# Patient Record
Sex: Male | Born: 1950
Health system: Southern US, Community
[De-identification: ages and names within clinical notes are randomized; demographics above are authoritative.]

## PROBLEM LIST (undated history)

## (undated) DIAGNOSIS — C61 Malignant neoplasm of prostate: Secondary | ICD-10-CM

## (undated) DIAGNOSIS — H409 Unspecified glaucoma: Secondary | ICD-10-CM

## (undated) DIAGNOSIS — J449 Chronic obstructive pulmonary disease, unspecified: Secondary | ICD-10-CM

## (undated) DIAGNOSIS — Z8669 Personal history of other diseases of the nervous system and sense organs: Secondary | ICD-10-CM

## (undated) DIAGNOSIS — H269 Unspecified cataract: Secondary | ICD-10-CM

## (undated) DIAGNOSIS — I2510 Atherosclerotic heart disease of native coronary artery without angina pectoris: Secondary | ICD-10-CM

## (undated) DIAGNOSIS — E785 Hyperlipidemia, unspecified: Secondary | ICD-10-CM

## (undated) DIAGNOSIS — C4491 Basal cell carcinoma of skin, unspecified: Secondary | ICD-10-CM

## (undated) HISTORY — DX: Chronic obstructive pulmonary disease, unspecified: J44.9

## (undated) HISTORY — PX: JOINT REPLACEMENT: SHX530

## (undated) HISTORY — DX: Personal history of other diseases of the nervous system and sense organs: Z86.69

## (undated) HISTORY — DX: Unspecified cataract: H26.9

## (undated) HISTORY — PX: HERNIA REPAIR: SHX51

## (undated) HISTORY — DX: Malignant neoplasm of prostate: C61

## (undated) HISTORY — DX: Basal cell carcinoma of skin, unspecified: C44.91

## (undated) HISTORY — DX: Hyperlipidemia, unspecified: E78.5

## (undated) HISTORY — DX: Unspecified glaucoma: H40.9

---

## 1998-02-18 HISTORY — PX: SIGMOIDOSCOPY: SUR1295

## 2002-02-18 HISTORY — PX: PROSTATECTOMY: SHX69

## 2007-02-19 HISTORY — PX: TOTAL KNEE ARTHROPLASTY: SHX125

## 2011-02-19 HISTORY — PX: COLONOSCOPY: SHX174

## 2013-02-18 HISTORY — PX: RETINAL DETACHMENT SURGERY: SHX105

## 2015-02-19 HISTORY — PX: RETINAL LASER PROCEDURE: SHX2339

## 2016-02-29 DIAGNOSIS — H9193 Unspecified hearing loss, bilateral: Secondary | ICD-10-CM | POA: Diagnosis not present

## 2016-02-29 DIAGNOSIS — Z8669 Personal history of other diseases of the nervous system and sense organs: Secondary | ICD-10-CM | POA: Diagnosis not present

## 2016-02-29 DIAGNOSIS — H9313 Tinnitus, bilateral: Secondary | ICD-10-CM | POA: Diagnosis not present

## 2016-02-29 DIAGNOSIS — Z8546 Personal history of malignant neoplasm of prostate: Secondary | ICD-10-CM | POA: Insufficient documentation

## 2016-02-29 DIAGNOSIS — H5461 Unqualified visual loss, right eye, normal vision left eye: Secondary | ICD-10-CM | POA: Diagnosis not present

## 2016-02-29 DIAGNOSIS — Z808 Family history of malignant neoplasm of other organs or systems: Secondary | ICD-10-CM | POA: Diagnosis not present

## 2016-02-29 DIAGNOSIS — Z136 Encounter for screening for cardiovascular disorders: Secondary | ICD-10-CM | POA: Diagnosis not present

## 2016-03-01 LAB — PSA

## 2016-03-01 LAB — BASIC METABOLIC PANEL
Creatinine: 0.9 (ref 0.6–1.3)
Glucose: 94

## 2016-03-01 LAB — CBC AND DIFFERENTIAL
HCT: 43 (ref 41–53)
HEMOGLOBIN: 14.4 (ref 13.5–17.5)
WBC: 6

## 2016-03-01 LAB — LIPID PANEL
CHOLESTEROL: 238 — AB (ref 0–200)
HDL: 56 (ref 35–70)
LDL CALC: 170
Triglycerides: 59 (ref 40–160)

## 2016-03-05 DIAGNOSIS — Z23 Encounter for immunization: Secondary | ICD-10-CM | POA: Diagnosis not present

## 2016-04-01 DIAGNOSIS — B351 Tinea unguium: Secondary | ICD-10-CM | POA: Diagnosis not present

## 2016-04-01 DIAGNOSIS — D492 Neoplasm of unspecified behavior of bone, soft tissue, and skin: Secondary | ICD-10-CM | POA: Diagnosis not present

## 2016-04-01 DIAGNOSIS — D229 Melanocytic nevi, unspecified: Secondary | ICD-10-CM | POA: Diagnosis not present

## 2016-04-01 DIAGNOSIS — L821 Other seborrheic keratosis: Secondary | ICD-10-CM | POA: Diagnosis not present

## 2016-04-01 DIAGNOSIS — L570 Actinic keratosis: Secondary | ICD-10-CM | POA: Diagnosis not present

## 2016-05-20 DIAGNOSIS — H35412 Lattice degeneration of retina, left eye: Secondary | ICD-10-CM | POA: Diagnosis not present

## 2016-05-20 DIAGNOSIS — H4720 Unspecified optic atrophy: Secondary | ICD-10-CM | POA: Diagnosis not present

## 2016-05-20 DIAGNOSIS — H33051 Total retinal detachment, right eye: Secondary | ICD-10-CM | POA: Diagnosis not present

## 2016-05-29 DIAGNOSIS — H35412 Lattice degeneration of retina, left eye: Secondary | ICD-10-CM | POA: Diagnosis not present

## 2016-12-30 DIAGNOSIS — M5136 Other intervertebral disc degeneration, lumbar region: Secondary | ICD-10-CM | POA: Diagnosis not present

## 2017-02-18 HISTORY — PX: BASAL CELL CARCINOMA EXCISION: SHX1214

## 2017-03-18 ENCOUNTER — Ambulatory Visit (INDEPENDENT_AMBULATORY_CARE_PROVIDER_SITE_OTHER): Payer: Medicare Other

## 2017-03-18 ENCOUNTER — Encounter: Payer: Self-pay | Admitting: Podiatry

## 2017-03-18 ENCOUNTER — Ambulatory Visit (INDEPENDENT_AMBULATORY_CARE_PROVIDER_SITE_OTHER): Payer: Medicare Other | Admitting: Podiatry

## 2017-03-18 VITALS — BP 144/73 | HR 79 | Resp 16

## 2017-03-18 DIAGNOSIS — A499 Bacterial infection, unspecified: Secondary | ICD-10-CM | POA: Diagnosis not present

## 2017-03-18 DIAGNOSIS — M778 Other enthesopathies, not elsewhere classified: Secondary | ICD-10-CM

## 2017-03-18 DIAGNOSIS — M7750 Other enthesopathy of unspecified foot: Secondary | ICD-10-CM | POA: Diagnosis not present

## 2017-03-18 DIAGNOSIS — M779 Enthesopathy, unspecified: Principal | ICD-10-CM

## 2017-03-18 DIAGNOSIS — M79673 Pain in unspecified foot: Secondary | ICD-10-CM

## 2017-03-18 DIAGNOSIS — B351 Tinea unguium: Secondary | ICD-10-CM | POA: Diagnosis not present

## 2017-03-18 DIAGNOSIS — L603 Nail dystrophy: Secondary | ICD-10-CM | POA: Diagnosis not present

## 2017-03-18 DIAGNOSIS — L814 Other melanin hyperpigmentation: Secondary | ICD-10-CM | POA: Diagnosis not present

## 2017-03-18 NOTE — Progress Notes (Signed)
Dg  

## 2017-03-18 NOTE — Progress Notes (Signed)
Subjective:  Patient ID: Bryan Cowan, male    DOB: 05-Sep-1950,  MRN: [id number] HPI Chief Complaint  Patient presents with  . Foot Pain    Patient has multiple concerns - 1) involuntart movements/twitches in toes-does know that he has a nerve impingement in back, 2) Toenails thickened and discolored-has taken oral antifungal in the past and would like other options for treatment, 3) Calluses plantar forefoot bilateral, 4) recommendations on foot care-interested in orthotics and shoe gear    67 y.o. male presents with the above complaint.     No past medical history on file.  Current Outpatient Medications:  .  Coenzyme Q10 100 MG capsule, Take by mouth., Disp: , Rfl:  .  MILK THISTLE PO, Take by mouth., Disp: , Rfl:  .  Multiple Vitamin (MULTIVITAMIN) capsule, Take by mouth., Disp: , Rfl:  .  Omega-3 Fatty Acids (FISH OIL) 1000 MG CAPS, Take by mouth., Disp: , Rfl:   No Known Allergies Review of Systems  Musculoskeletal: Positive for back pain.  All other systems reviewed and are negative.  Objective:   Vitals:   03/18/17 1601  BP: (!) 144/73  Pulse: 79  Resp: 16    General: Well developed, nourished, in no acute distress, alert and oriented x3   Dermatological: Skin is warm, dry and supple bilateral. Nails x 10 are well maintained; remaining integument appears unremarkable at this time. There are no open sores, no preulcerative lesions, no rash or signs of infection present.  Diffuse tyloma to the plantar aspect of the forefoot bilateral due to cavus deformity.  Also demonstrates thick yellow dystrophic clinically mycotic nails 1 through 5 of the right foot 3 through 5 of the left foot  Vascular: Dorsalis Pedis artery and Posterior Tibial artery pedal pulses are 2/4 bilateral with immedate capillary fill time. Pedal hair growth present. No varicosities and no lower extremity edema present bilateral.   Neruologic: Grossly intact via light touch bilateral. Vibratory intact  via tuning fork bilateral. Protective threshold with Semmes Wienstein monofilament intact to all pedal sites bilateral. Patellar and Achilles deep tendon reflexes 2+ bilateral. No Babinski or clonus noted bilateral.  Very slightly diminished sensory perception to the plantar forefoot most likely due to the thickening callus in the chronic pressures.  Musculoskeletal: No gross boney pedal deformities bilateral. No pain, crepitus, or limitation noted with foot and ankle range of motion bilateral. Muscular strength 5/5 in all groups tested bilateral.  Cavus foot deformity with hammertoe deformities resulting in reactive hyperkeratosis to the plantar aspect of the forefoot.  He does appear to have some arthritic changes to the lesser toes.  Particularly those on the right foot.  Gait: Unassisted, Nonantalgic.    Radiographs:  Radiographs taken today do not demonstrate any acute osseous abnormalities.  However cavus foot deformity is noted with hammertoe deformities.  Some appear to be arthritic particularly second digit right greater than that of the left  Assessment & Plan:   Assessment: Cavus foot deformity with metatarsalgia and some neuropathic symptoms probably most associated with metatarsal pressure.  Hammertoe deformities.  Nail dystrophy cannot rule out onychomycosis.  Plan: Discussed etiology pathology conservative versus surgical therapies.  I feel that a lot of his foot discomfort could be taken care of with a good set of orthotics.  He was casted today with Liliane Channel for orthotics.  Samples of his skin and nail was also taken for pathologic evaluation.  He does not want to take oral medication.  I offered him gabapentin  as well for his neuropathic nocturnal symptoms but he declined.     Carrine Kroboth T. [place], [place]

## 2017-03-19 ENCOUNTER — Encounter: Payer: Self-pay | Admitting: Podiatry

## 2017-04-03 ENCOUNTER — Ambulatory Visit: Payer: Medicare Other | Admitting: Podiatry

## 2017-04-14 ENCOUNTER — Ambulatory Visit (INDEPENDENT_AMBULATORY_CARE_PROVIDER_SITE_OTHER): Payer: Medicare Other | Admitting: Orthotics

## 2017-04-14 DIAGNOSIS — M779 Enthesopathy, unspecified: Principal | ICD-10-CM

## 2017-04-14 DIAGNOSIS — M778 Other enthesopathies, not elsewhere classified: Secondary | ICD-10-CM

## 2017-04-14 NOTE — Progress Notes (Signed)
Patient came in today to pick up custom made foot orthotics.  I wasn't happy with the way the arch wasn't supported, even though we asked for "hug the arch".[name]

## 2017-05-05 ENCOUNTER — Ambulatory Visit (INDEPENDENT_AMBULATORY_CARE_PROVIDER_SITE_OTHER): Payer: Medicare Other | Admitting: Podiatry

## 2017-05-05 ENCOUNTER — Encounter: Payer: Self-pay | Admitting: Podiatry

## 2017-05-05 DIAGNOSIS — Z79899 Other long term (current) drug therapy: Secondary | ICD-10-CM

## 2017-05-05 DIAGNOSIS — L603 Nail dystrophy: Secondary | ICD-10-CM

## 2017-05-05 MED ORDER — TERBINAFINE HCL 250 MG PO TABS: 250.0000 mg | ORAL_TABLET | Freq: Every day | ORAL | 0 refills | Status: DC

## 2017-05-05 NOTE — Progress Notes (Signed)
He presents today for follow-up of his supinated foot type left and the use of the orthotics along with nail biopsy.  Objective: Vital signs are stable he is alert and oriented x3 he is going to follow-up with Liliane Channel as far as a supination of the left foot goes.  States that he feels that the orthotics have helped some degree however the apertures are in the wrong position.  I evaluated them I agree this with him about the position of the aperture.  Pulses are strongly palpable neurologic sensorium is intact degenerative flexors are intact muscle strength is normal symmetrical.  Orthopedic evaluation images all joints distal ankle full range of motion no crepitation.  Cutaneous evaluation demonstrates supple well-hydrated cutis.  Toenails are thick yellow dystrophic-like mycotic with hammertoe deformities.  Pathology report does demonstrate onychomycosis.  Assessment: Onychomycosis and varus deformity.  Plan: Discussed etiology pathology conservative versus surgical therapies at this point I will start him on terbinafine and have a liver profile performed.  We discussed this in great detail today he states that he may decide not to do this and I expressed to him that there is no problem with that.  We also discussed laser therapy as well as topical therapy.  I will follow-up with him in the near future for evaluation of the toenail should he develop or start taking the Lamisil.  However Liliane Channel will follow up with him in the near future for adjustments to the orthotics.

## 2017-05-05 NOTE — Patient Instructions (Signed)

## 2017-05-19 DIAGNOSIS — C4491 Basal cell carcinoma of skin, unspecified: Secondary | ICD-10-CM

## 2017-05-19 DIAGNOSIS — L918 Other hypertrophic disorders of the skin: Secondary | ICD-10-CM | POA: Diagnosis not present

## 2017-05-19 DIAGNOSIS — D229 Melanocytic nevi, unspecified: Secondary | ICD-10-CM | POA: Diagnosis not present

## 2017-05-19 DIAGNOSIS — C44219 Basal cell carcinoma of skin of left ear and external auricular canal: Secondary | ICD-10-CM | POA: Diagnosis not present

## 2017-05-19 HISTORY — DX: Basal cell carcinoma of skin, unspecified: C44.91

## 2017-05-26 ENCOUNTER — Ambulatory Visit: Payer: Medicare Other | Admitting: Orthotics

## 2017-05-26 DIAGNOSIS — R269 Unspecified abnormalities of gait and mobility: Secondary | ICD-10-CM

## 2017-05-26 NOTE — Progress Notes (Signed)
Increase dispersion offload LEFT, also post 4* valgus RF to address supination.   Change cover to zote/

## 2017-06-02 DIAGNOSIS — H33051 Total retinal detachment, right eye: Secondary | ICD-10-CM | POA: Diagnosis not present

## 2017-06-02 DIAGNOSIS — H35412 Lattice degeneration of retina, left eye: Secondary | ICD-10-CM | POA: Diagnosis not present

## 2017-06-02 DIAGNOSIS — H4720 Unspecified optic atrophy: Secondary | ICD-10-CM | POA: Diagnosis not present

## 2017-06-16 ENCOUNTER — Ambulatory Visit: Payer: Medicare Other | Admitting: Orthotics

## 2017-06-16 ENCOUNTER — Ambulatory Visit (INDEPENDENT_AMBULATORY_CARE_PROVIDER_SITE_OTHER): Payer: Medicare Other

## 2017-06-16 ENCOUNTER — Ambulatory Visit (INDEPENDENT_AMBULATORY_CARE_PROVIDER_SITE_OTHER): Payer: Medicare Other | Admitting: Podiatry

## 2017-06-16 ENCOUNTER — Encounter: Payer: Self-pay | Admitting: Podiatry

## 2017-06-16 DIAGNOSIS — M779 Enthesopathy, unspecified: Secondary | ICD-10-CM

## 2017-06-16 DIAGNOSIS — R269 Unspecified abnormalities of gait and mobility: Secondary | ICD-10-CM

## 2017-06-16 DIAGNOSIS — M7751 Other enthesopathy of right foot: Secondary | ICD-10-CM

## 2017-06-16 DIAGNOSIS — M778 Other enthesopathies, not elsewhere classified: Secondary | ICD-10-CM

## 2017-06-16 NOTE — Progress Notes (Signed)
He presents today with his wife after seeing Liliane Channel this morning modifying and orthotic temporarily right.  He states that he has sharp stabbing pain when he is walking that comes from his ankle and across the top of his foot.  He states that he seems to radiate up his leg he states that his been taking magnesium and Advil which seems to be helping to some degree.  Objective: Vital signs are stable he is alert and oriented x3 evaluation of the right ankle does demonstrate some tenderness on end range of motion and tenderness on palpation particular along the medial gutter and the superior margin.  There is no erythema cellulitis drainage or odor but there appears to be some fluctuance within the ankle.  Assessment: Capsulitis of the right ankle.  Plan: After sterile Betadine skin prep I injected 20 mg of Kenalog 5 mg Marcaine into the ankle joint right.  He tolerated procedure well recommended that he get back into his regular shoes with his orthotics.  Provided him with a small heel lift for his left foot so he does not feel that one leg is longer than the other.

## 2017-06-16 NOTE — Progress Notes (Signed)
Picked up adusted f/o (increased arch height, dispersment)  Added tempor varus medial wedge to right.

## 2017-06-26 DIAGNOSIS — C4441 Basal cell carcinoma of skin of scalp and neck: Secondary | ICD-10-CM | POA: Diagnosis not present

## 2017-06-30 DIAGNOSIS — H33051 Total retinal detachment, right eye: Secondary | ICD-10-CM | POA: Diagnosis not present

## 2017-06-30 DIAGNOSIS — Z961 Presence of intraocular lens: Secondary | ICD-10-CM | POA: Diagnosis not present

## 2017-06-30 DIAGNOSIS — H401113 Primary open-angle glaucoma, right eye, severe stage: Secondary | ICD-10-CM | POA: Diagnosis not present

## 2017-09-08 ENCOUNTER — Ambulatory Visit: Payer: Medicare Other | Admitting: Podiatry

## 2017-09-08 ENCOUNTER — Encounter: Payer: Self-pay | Admitting: Podiatry

## 2017-09-08 ENCOUNTER — Ambulatory Visit (INDEPENDENT_AMBULATORY_CARE_PROVIDER_SITE_OTHER): Payer: Medicare Other | Admitting: Podiatry

## 2017-09-08 DIAGNOSIS — M779 Enthesopathy, unspecified: Secondary | ICD-10-CM | POA: Diagnosis not present

## 2017-09-08 DIAGNOSIS — M7751 Other enthesopathy of right foot: Secondary | ICD-10-CM

## 2017-09-08 NOTE — Progress Notes (Signed)
Presents today for follow-up of capsulitis of the ankle along the medial aspect.  States that I am having stabbing pain mostly at rest not necessarily walking but I feel that the foot is starting to collapse more when on walking he states the shot did not really help at all it seems to be if anything worse now than it was previously.  He states that his wife even videos his feet as he sits in his recliner stating that there it is constantly moving and jumping.  Objective: Vital signs are stable alert and oriented x3.  He has pain on range of motion of the ankle joint and subtalar joint and on palpation posterior tibial tendon and on palpation of the posterior tibial nerve.  No radiating pain proximally.  His wife demonstrates a video of him and constant foot motion at rest.  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  Plan: At this point I feel that is necessary to rule out any osseous abnormalities that would result in sharp shooting pain or MRI we are going to request with a history of trauma to the right lower extremity and posterior tibial tendinitis cannot rule out a tear of the posterior tibial tendon this is for surgical reconstruction.  Should this not demonstrate any type of significant pathology relating the to types of symptoms that would consider a nerve conduction test after this.

## 2017-09-09 ENCOUNTER — Telehealth: Payer: Self-pay

## 2017-09-09 NOTE — Telephone Encounter (Signed)
-----   Message from Rip Harbour, [facility] sent at 09/08/2017  8:41 AM EDT ----- Regarding: MRI  MRI right ankle - evaluate subtalar joint injury/arthritis, posterior tibial tendon tear right - surgical consideration

## 2017-09-09 NOTE — Telephone Encounter (Signed)
No precert required for MRI.  Patient has been contacted and voice mail left instructing him to call scheduling department to set up his appt.

## 2017-09-09 NOTE — Addendum Note (Signed)
Addended by: Graceann Congress D on: 09/09/2017 10:22 AM   Modules accepted: Orders

## 2017-09-11 ENCOUNTER — Ambulatory Visit: Payer: Medicare Other | Admitting: Podiatry

## 2017-09-16 ENCOUNTER — Ambulatory Visit (HOSPITAL_COMMUNITY)
Admission: RE | Admit: 2017-09-16 | Discharge: 2017-09-16 | Disposition: A | Discharge status: Home / Self Care | Payer: Medicare Other | Source: Ambulatory Visit | Attending: Podiatry | Admitting: Podiatry

## 2017-09-16 DIAGNOSIS — M19071 Primary osteoarthritis, right ankle and foot: Secondary | ICD-10-CM | POA: Insufficient documentation

## 2017-09-16 DIAGNOSIS — M7751 Other enthesopathy of right foot: Secondary | ICD-10-CM

## 2017-09-16 DIAGNOSIS — R600 Localized edema: Secondary | ICD-10-CM | POA: Diagnosis not present

## 2017-09-16 DIAGNOSIS — M779 Enthesopathy, unspecified: Secondary | ICD-10-CM | POA: Diagnosis not present

## 2017-09-16 IMAGING — MR MR ANKLE*R* W/O CM
4 of 5 series · 19 of 40 positions shown · non-contrast
Comparison: Plain films right ankle [DATE].

CLINICAL DATA: Medial right ankle pain for several months. No known
injury.

EXAM:
MRI OF THE RIGHT ANKLE WITHOUT CONTRAST
TECHNIQUE: Multiplanar, multisequence MR imaging of the ankle was performed. No
intravenous contrast was administered.

[Series 5: T2 fat-sat · axial · 3.0mm · 0.33mm/px · z∈[-135,-15]mm · 4 of 41 slices shown (1 of 2)]
[im 1/41]
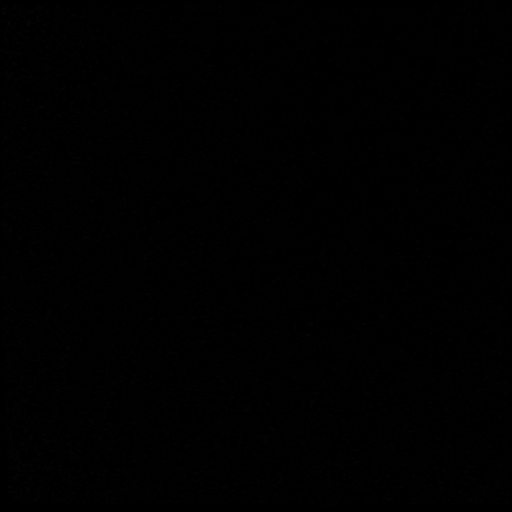
[im 6/41]
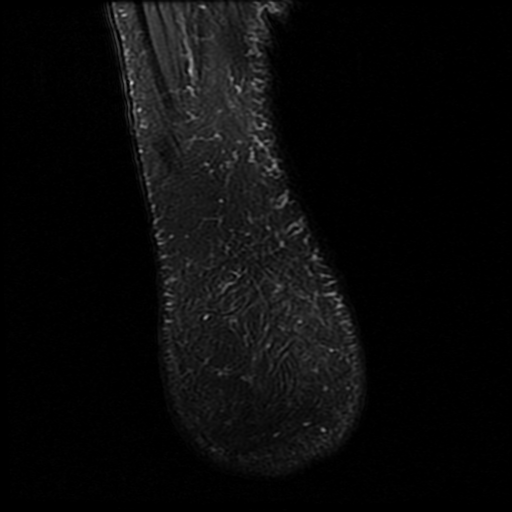
[im 21/41]
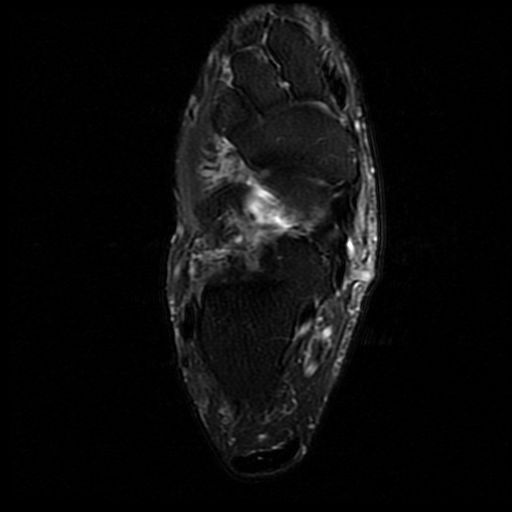
[im 36/41]
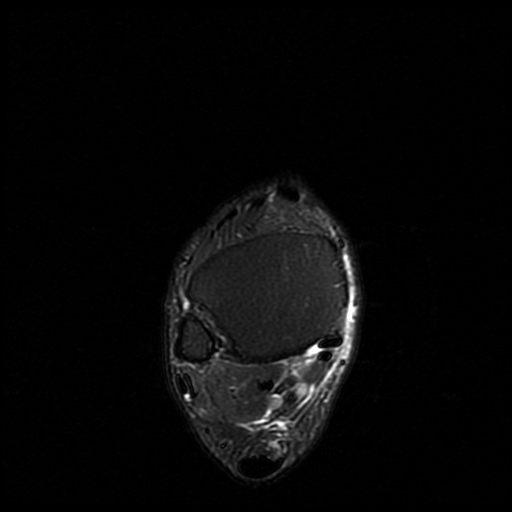

[Series 6: T1 · sagittal · 3.0mm · 0.35mm/px · 3 of 29 slices shown]
[im 6/29]
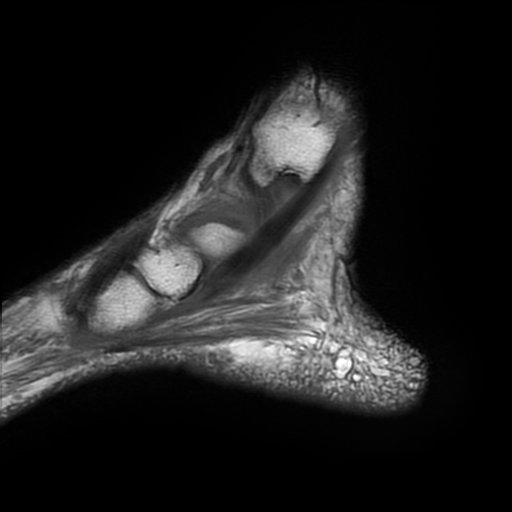
[im 17/29]
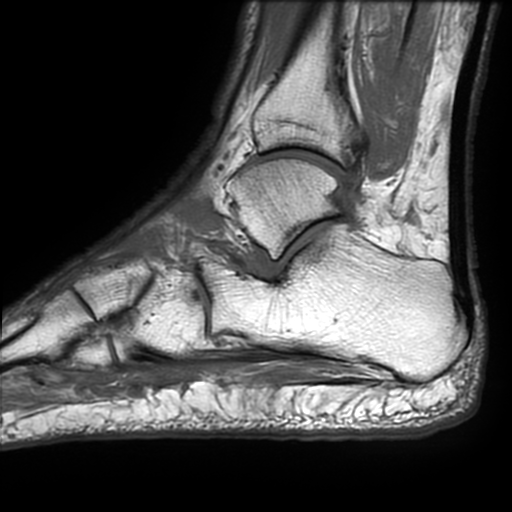
[im 29/29]
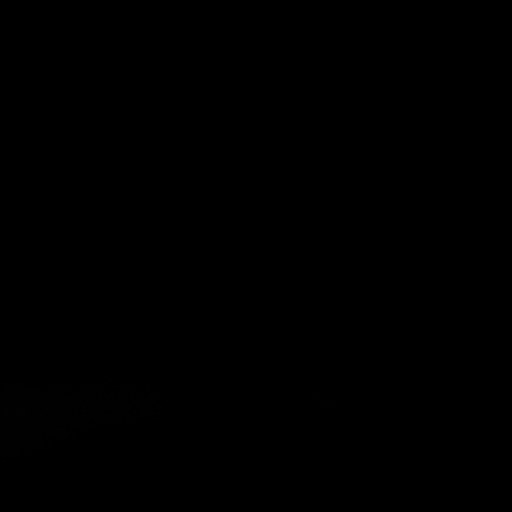

[Series 6: PD fat-sat · axial · 3.0mm · 0.33mm/px · z∈[-135,+2]mm · 9 of 41 slices shown]
[im 1/41]
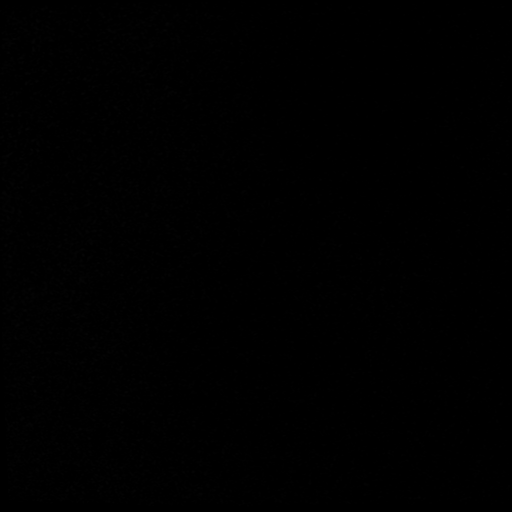
[im 6/41]
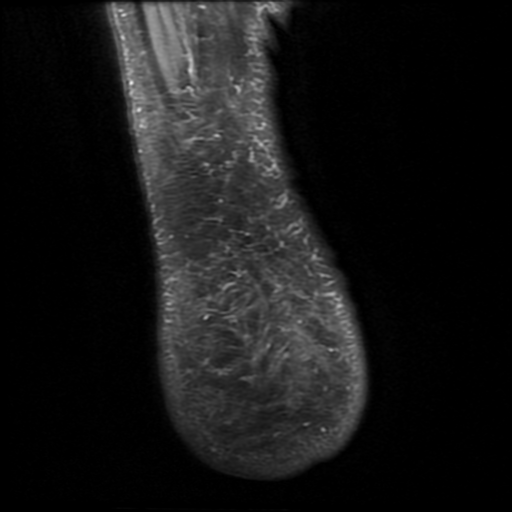
[im 11/41]
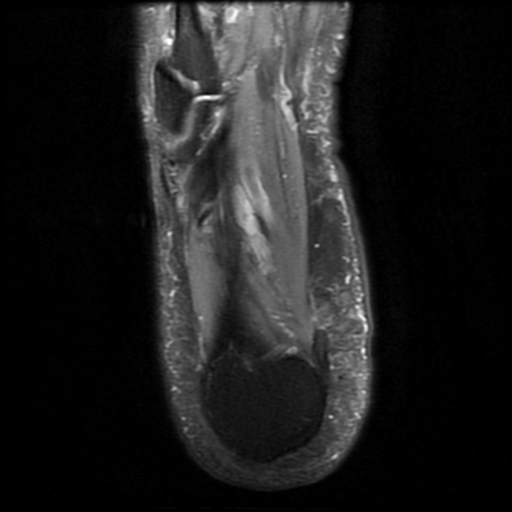
[im 16/41]
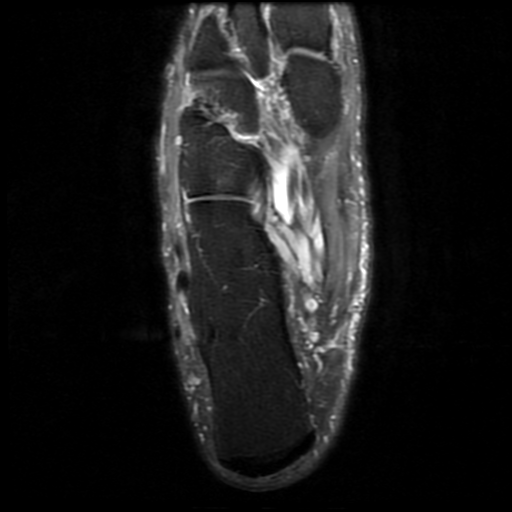
[im 21/41]
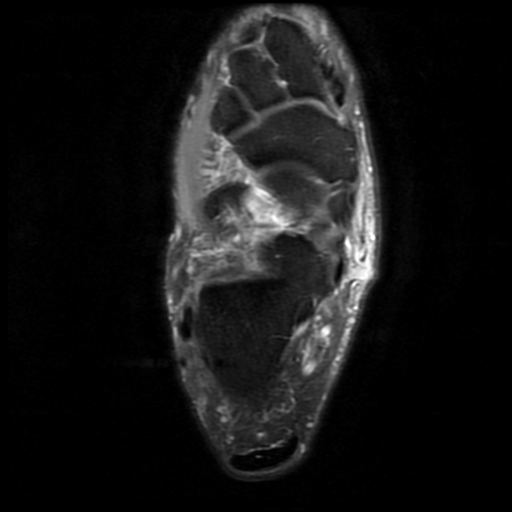
[im 26/41]
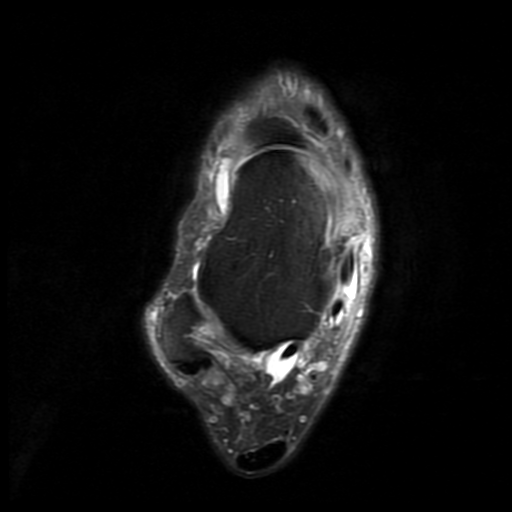
[im 31/41]
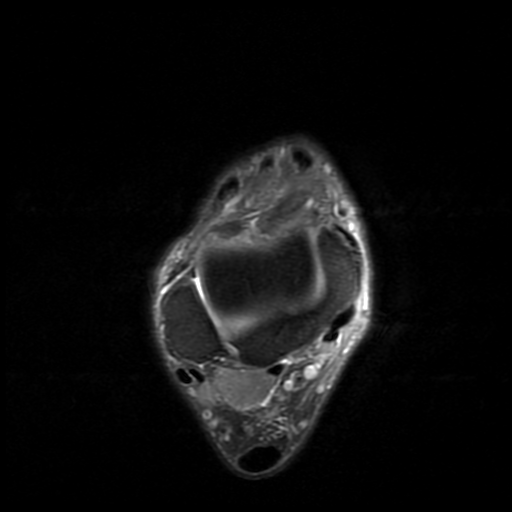
[im 36/41]
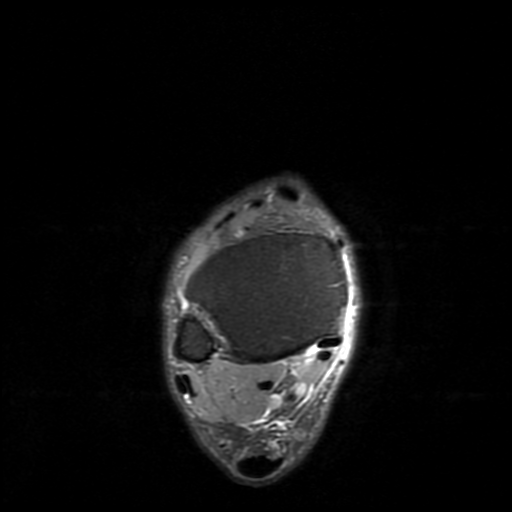
[im 41/41]
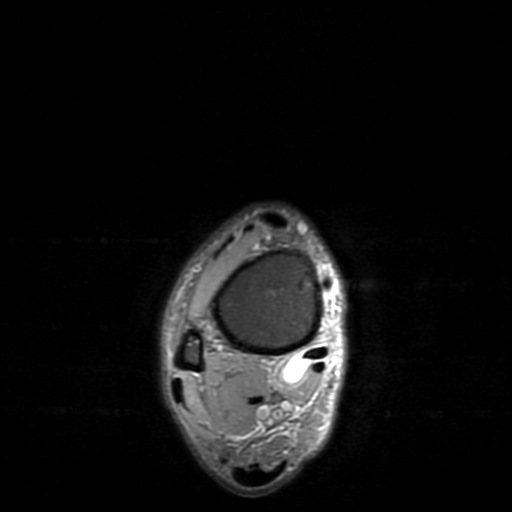

[Series 7: T2 fat-sat · coronal · 3.0mm · 0.31mm/px · 3 of 46 slices shown (2 of 2)]
[im 6/46]
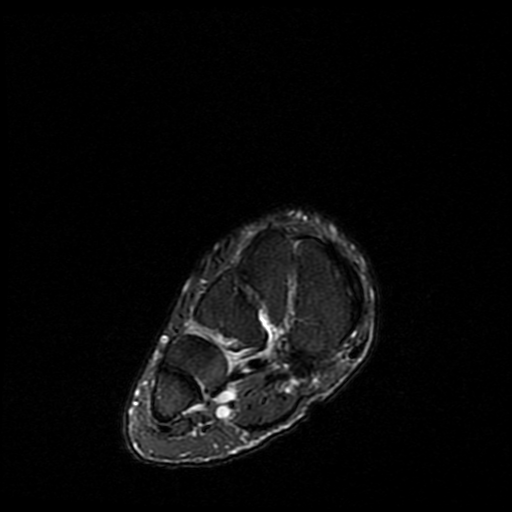
[im 26/46]
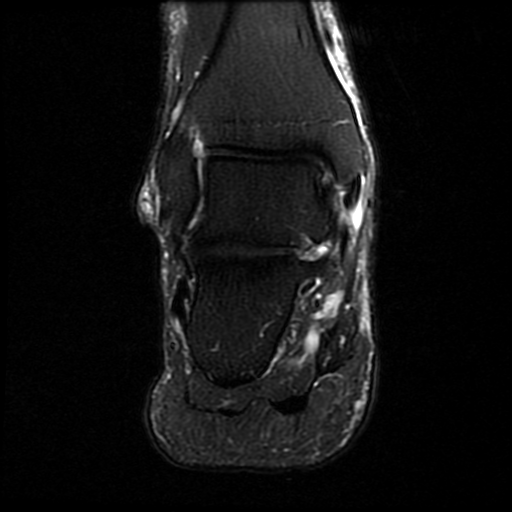
[im 41/46]
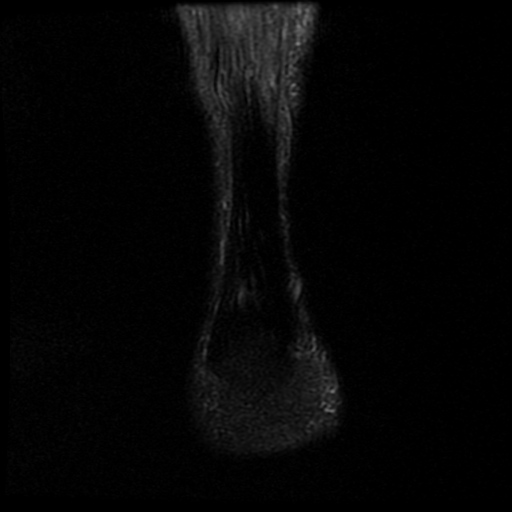

[19 of 40 positions shown; findings below may reference images not displayed]

FINDINGS: TENDONS

Peroneal: Intact.

Posteromedial: A small volume of fluid is seen in the sheath of the
tibialis posterior tendon and there is mild intrasubstance increased
T2 signal within the tendon as it passes along the neck of the
talus. No tear. The flexor digitorum longus and flexor hallucis
longus appear normal.

Anterior: Intact.

Achilles: Intact.

Plantar Fascia: Intact and normal in appearance.

LIGAMENTS

Lateral: Intact.

Medial: Intact.

CARTILAGE

Ankle Joint: Appears normal. No osteochondral lesion of the talar
dome.

Subtalar Joints/Sinus Tarsi: Normal.

Bones: Mild marrow edema is seen along the medial periphery of the
distal neck of the talus most consistent with reactive change from
PTT tendinosis. Mild subchondral edema about the calcaneocuboid
joint is noted. No fracture or worrisome lesion.

Other: None.
IMPRESSION: Mild to moderate appearing PTT tendinosis without tear.

Mild degenerative disease most notable at the calcaneocuboid joint.

## 2017-09-19 ENCOUNTER — Telehealth: Payer: Self-pay | Admitting: Podiatry

## 2017-09-19 NOTE — Telephone Encounter (Signed)
I spoke with patient and informed him that results are back, but have been sent to overread.  I explained the process and sent him to schedule follow up in 3 weeks once the overread comes back.  He verbalized understanding

## 2017-09-19 NOTE — Telephone Encounter (Signed)
I had an MRI of my right ankle foot on Tuesday. The MRI tech said I should be hearing something back in a couple of days. I'm hoping I can hear something back today, I would greatly appreciate it. My number is [phone]. Thank you very much.

## 2017-09-22 ENCOUNTER — Telehealth: Payer: Self-pay | Admitting: *Deleted

## 2017-09-22 NOTE — Telephone Encounter (Signed)
-----   Message from Garrel Ridgel, [place] sent at 09/18/2017  1:32 PM EDT ----- Please send for an over read and inform patient of the delay

## 2017-09-22 NOTE — Telephone Encounter (Signed)
Left message informing pt of Dr. Stephenie Acres review of results and request to send copy of MRI disc to a radiology specialist for more information to plan treatment. Faxed request for [facility] copy of MRI disc to Records.

## 2017-09-25 NOTE — Telephone Encounter (Signed)
Received MRI disc 09/24/2017, mailed to [facility] on 09/25/2017.

## 2017-10-06 ENCOUNTER — Ambulatory Visit: Payer: Medicare Other | Admitting: Podiatry

## 2017-10-06 ENCOUNTER — Encounter: Payer: Self-pay | Admitting: Podiatry

## 2017-10-14 ENCOUNTER — Ambulatory Visit (INDEPENDENT_AMBULATORY_CARE_PROVIDER_SITE_OTHER): Payer: Medicare Other | Admitting: Podiatry

## 2017-10-14 ENCOUNTER — Encounter: Payer: Self-pay | Admitting: Podiatry

## 2017-10-14 DIAGNOSIS — M7751 Other enthesopathy of right foot: Secondary | ICD-10-CM | POA: Diagnosis not present

## 2017-10-15 ENCOUNTER — Telehealth: Payer: Self-pay | Admitting: *Deleted

## 2017-10-15 DIAGNOSIS — R269 Unspecified abnormalities of gait and mobility: Secondary | ICD-10-CM

## 2017-10-15 DIAGNOSIS — M779 Enthesopathy, unspecified: Secondary | ICD-10-CM

## 2017-10-15 DIAGNOSIS — M7751 Other enthesopathy of right foot: Secondary | ICD-10-CM

## 2017-10-15 NOTE — Progress Notes (Signed)
He presents with his wife today for follow-up of his painful medial ankle.  And MRI read.  Objective: Vital signs are stable he is alert and oriented x3 he still has pain on palpation of the posterior tibial tendon distal to the medial malleolus extending to the navicular tuberosity of the right ankle.  MRI does demonstrate what appears to be an interstitial split tear distal to the medial malleolus extending to the navicular tuberosity.  Assessment posterior tibial tendinosis with split tear interstitial type.  Plan: We will try physical therapy first with a Tri-Lock brace and I will follow-up with him in 1 month.  If this fails surgery he says only option.

## 2017-10-15 NOTE — Telephone Encounter (Signed)
-----   Message from [place] sent at 10/14/2017  4:19 PM EDT ----- Regarding: PT referral  PT - In house [facility]  Evaluate and treat posterior tibial tendonitis right  Duration: 3 x week x 4 weeks

## 2017-10-15 NOTE — Telephone Encounter (Signed)
Hand delivered to [facility] - In-office.

## 2017-10-17 ENCOUNTER — Encounter: Payer: Self-pay | Admitting: Podiatry

## 2017-10-22 ENCOUNTER — Telehealth: Payer: Self-pay | Admitting: *Deleted

## 2017-10-22 NOTE — Telephone Encounter (Signed)
-----   Message from Garrel Ridgel, [place] sent at 10/22/2017  7:21 AM EDT ----- Findings similar to initial mri and should continue with PT.  If not improved then surgical consideration.

## 2017-10-22 NOTE — Telephone Encounter (Signed)
Left message requesting a call back to discuss results. 

## 2017-10-22 NOTE — Telephone Encounter (Signed)
I informed pt of Dr. Hyatt's review of results and recommendations. 

## 2017-10-27 DIAGNOSIS — M25571 Pain in right ankle and joints of right foot: Secondary | ICD-10-CM | POA: Diagnosis not present

## 2017-10-27 DIAGNOSIS — R269 Unspecified abnormalities of gait and mobility: Secondary | ICD-10-CM | POA: Diagnosis not present

## 2017-10-27 DIAGNOSIS — M25471 Effusion, right ankle: Secondary | ICD-10-CM | POA: Diagnosis not present

## 2017-10-27 DIAGNOSIS — M25671 Stiffness of right ankle, not elsewhere classified: Secondary | ICD-10-CM | POA: Diagnosis not present

## 2017-10-29 DIAGNOSIS — M25571 Pain in right ankle and joints of right foot: Secondary | ICD-10-CM | POA: Diagnosis not present

## 2017-10-29 DIAGNOSIS — M25471 Effusion, right ankle: Secondary | ICD-10-CM | POA: Diagnosis not present

## 2017-10-29 DIAGNOSIS — R269 Unspecified abnormalities of gait and mobility: Secondary | ICD-10-CM | POA: Diagnosis not present

## 2017-10-29 DIAGNOSIS — M25671 Stiffness of right ankle, not elsewhere classified: Secondary | ICD-10-CM | POA: Diagnosis not present

## 2017-11-03 DIAGNOSIS — H33051 Total retinal detachment, right eye: Secondary | ICD-10-CM | POA: Diagnosis not present

## 2017-11-03 DIAGNOSIS — M25671 Stiffness of right ankle, not elsewhere classified: Secondary | ICD-10-CM | POA: Diagnosis not present

## 2017-11-03 DIAGNOSIS — H401113 Primary open-angle glaucoma, right eye, severe stage: Secondary | ICD-10-CM | POA: Diagnosis not present

## 2017-11-03 DIAGNOSIS — M25471 Effusion, right ankle: Secondary | ICD-10-CM | POA: Diagnosis not present

## 2017-11-03 DIAGNOSIS — R269 Unspecified abnormalities of gait and mobility: Secondary | ICD-10-CM | POA: Diagnosis not present

## 2017-11-03 DIAGNOSIS — M25571 Pain in right ankle and joints of right foot: Secondary | ICD-10-CM | POA: Diagnosis not present

## 2017-11-03 DIAGNOSIS — Z961 Presence of intraocular lens: Secondary | ICD-10-CM | POA: Diagnosis not present

## 2017-11-28 DIAGNOSIS — L603 Nail dystrophy: Secondary | ICD-10-CM | POA: Diagnosis not present

## 2017-11-28 DIAGNOSIS — Z79899 Other long term (current) drug therapy: Secondary | ICD-10-CM | POA: Diagnosis not present

## 2017-11-29 LAB — HEPATIC FUNCTION PANEL
ALT: 23 IU/L (ref 0–44)
AST: 24 IU/L (ref 0–40)
Albumin: 4.3 g/dL (ref 3.6–4.8)
Alkaline Phosphatase: 147 IU/L — ABNORMAL HIGH (ref 39–117)
BILIRUBIN TOTAL: 1 mg/dL (ref 0.0–1.2)
BILIRUBIN, DIRECT: 0.34 mg/dL (ref 0.00–0.40)
Total Protein: 6.9 g/dL (ref 6.0–8.5)

## 2017-12-01 ENCOUNTER — Telehealth: Payer: Self-pay | Admitting: *Deleted

## 2017-12-01 ENCOUNTER — Ambulatory Visit: Payer: Medicare Other | Admitting: Podiatry

## 2017-12-01 NOTE — Telephone Encounter (Signed)
Left message to call to discuss Dr. Stephenie Acres review of labs and orders.

## 2017-12-01 NOTE — Telephone Encounter (Signed)
-----   Message from Garrel Ridgel, [place] sent at 12/01/2017  4:49 PM EDT ----- Saw that he had his liver profile done which looks ok.  Does he want to be put on terbinifine for fungus nails.  If so then write him for 30 days and fu with [place] in one month for med check.

## 2017-12-08 NOTE — Telephone Encounter (Deleted)
-----   Message from Garrel Ridgel, [place] sent at 12/01/2017  4:49 PM EDT ----- Saw that he had his liver profile done which looks ok.  Does he want to be put on terbinifine for fungus nails.  If so then write him for 30 days and fu with [place] in one month for med check.

## 2017-12-08 NOTE — Telephone Encounter (Signed)
Pt states he is not interested in taking the lamisil.

## 2018-01-13 ENCOUNTER — Ambulatory Visit (INDEPENDENT_AMBULATORY_CARE_PROVIDER_SITE_OTHER): Payer: Medicare Other | Admitting: Family Medicine

## 2018-01-13 ENCOUNTER — Encounter: Payer: Self-pay | Admitting: Family Medicine

## 2018-01-13 VITALS — BP 121/86 | HR 86 | Temp 98.6°F | Ht 74.0 in | Wt 222.0 lb

## 2018-01-13 DIAGNOSIS — Z23 Encounter for immunization: Secondary | ICD-10-CM | POA: Diagnosis not present

## 2018-01-13 DIAGNOSIS — E785 Hyperlipidemia, unspecified: Secondary | ICD-10-CM | POA: Diagnosis not present

## 2018-01-13 DIAGNOSIS — Z808 Family history of malignant neoplasm of other organs or systems: Secondary | ICD-10-CM | POA: Diagnosis not present

## 2018-01-13 DIAGNOSIS — H5461 Unqualified visual loss, right eye, normal vision left eye: Secondary | ICD-10-CM

## 2018-01-13 DIAGNOSIS — Z8669 Personal history of other diseases of the nervous system and sense organs: Secondary | ICD-10-CM | POA: Diagnosis not present

## 2018-01-13 DIAGNOSIS — C61 Malignant neoplasm of prostate: Secondary | ICD-10-CM

## 2018-01-13 LAB — CBC WITH DIFFERENTIAL/PLATELET
BASOS ABS: 0 10*3/uL (ref 0.0–0.1)
Basophils Relative: 0.6 % (ref 0.0–3.0)
EOS PCT: 3.9 % (ref 0.0–5.0)
Eosinophils Absolute: 0.2 10*3/uL (ref 0.0–0.7)
HCT: 44.4 % (ref 39.0–52.0)
Hemoglobin: 14.9 g/dL (ref 13.0–17.0)
LYMPHS ABS: 1.8 10*3/uL (ref 0.7–4.0)
Lymphocytes Relative: 29.8 % (ref 12.0–46.0)
MCHC: 33.6 g/dL (ref 30.0–36.0)
MCV: 83.7 fl (ref 78.0–100.0)
MONO ABS: 0.5 10*3/uL (ref 0.1–1.0)
Monocytes Relative: 8.3 % (ref 3.0–12.0)
NEUTROS ABS: 3.5 10*3/uL (ref 1.4–7.7)
Neutrophils Relative %: 57.4 % (ref 43.0–77.0)
PLATELETS: 203 10*3/uL (ref 150.0–400.0)
RBC: 5.3 Mil/uL (ref 4.22–5.81)
RDW: 13.1 % (ref 11.5–15.5)
WBC: 6.1 10*3/uL (ref 4.0–10.5)

## 2018-01-13 LAB — COMPREHENSIVE METABOLIC PANEL
ALK PHOS: 56 U/L (ref 39–117)
ALT: 16 U/L (ref 0–53)
AST: 22 U/L (ref 0–37)
Albumin: 4.5 g/dL (ref 3.5–5.2)
BILIRUBIN TOTAL: 0.7 mg/dL (ref 0.2–1.2)
BUN: 12 mg/dL (ref 6–23)
CO2: 24 mEq/L (ref 19–32)
Calcium: 9.3 mg/dL (ref 8.4–10.5)
Chloride: 104 mEq/L (ref 96–112)
Creatinine, Ser: 0.72 mg/dL (ref 0.40–1.50)
GFR: 114.99 mL/min (ref >60.00)
GLUCOSE: 94 mg/dL (ref 70–99)
Potassium: 3.9 mEq/L (ref 3.5–5.1)
SODIUM: 138 meq/L (ref 135–145)
TOTAL PROTEIN: 6.7 g/dL (ref 6.0–8.3)

## 2018-01-13 LAB — PSA: PSA: 0.03 ng/mL — AB (ref 0.10–4.00)

## 2018-01-13 LAB — LIPID PANEL
Cholesterol: 236 mg/dL — ABNORMAL HIGH (ref 0–200)
HDL: 50.5 mg/dL (ref >39.00)
LDL Cholesterol: 164 mg/dL — ABNORMAL HIGH (ref 0–99)
NONHDL: 185.7
Total CHOL/HDL Ratio: 5
Triglycerides: 110 mg/dL (ref 0.0–149.0)
VLDL: 22 mg/dL (ref 0.0–40.0)

## 2018-01-13 LAB — TSH: TSH: 1.8 u[IU]/mL (ref 0.35–4.50)

## 2018-01-13 NOTE — Assessment & Plan Note (Signed)
Has not been followed by urology.  We agreed to check his PSA here yearly and as long as he has no new symptoms or jumps in his PSA readings, I was comfortable not yet referring him back to urology.  He has had no issues or elevated PSAs since his TURP in 2004.

## 2018-01-13 NOTE — Patient Instructions (Addendum)
Great to meet you.  I will call you with your lab results from today and you can view them online.   Please schedule your physical and wellness in approximately one month.  Have a wonderful holiday season!!!

## 2018-01-13 NOTE — Assessment & Plan Note (Signed)
Very elevated when checked last year. LDL was 170. >30 minutes spent in face to face time with patient, >50% spent in counselling or coordination of care.  We discussed low cholesterol diet- cardiac risk factors.  Handout given for low cholesterol diet.  Recheck lipids today.  He is willing to discuss statin if necessary. The 10-year ASCVD risk score Mikey Bussing DC Brooke Bonito., et al., 2013) is: 16.1%*   Values used to calculate the score:     Age: 67 years     Sex: Male     Is Non-Hispanic African American: No     Diabetic: No     Tobacco smoker: No     Systolic Blood Pressure: 436 mmHg     Is BP treated: No     HDL Cholesterol: 56 mg/dL*     Total Cholesterol: 238 mg/dL*     * - Cholesterol units were assumed for this score calculation

## 2018-01-13 NOTE — Progress Notes (Signed)
Subjective:   Patient ID: Bryan Cowan, male    DOB: [birth year], 67 y.o.   MRN: [id number]  Bryan Cowan is a pleasant 67 y.o. year old male who presents to clinic today with Establish Care  on 01/13/2018  HPI: Here to establish care. Moved here from [place], [place] last April.  Chart reviewed from [facility] (Dr. Billey Chang).  History of prostate CA- s/p TURP in 2004. PSA was zero in 02/2016.    He does not have a urologist here.  Has not had any changes in urinary symptoms. Lab Results  Component Value Date   PSA <0.1 03/01/2016    HLD- LDL was  170 in 02/2016.   No known FH of CAD.  He has never taken a statin. Very active- walks 8 miles a day when he is working- works part time at [employer].  And also goes to the gym 3 days per week. Denies any HA, blurred vision,  CP, dizziness or SOB with exertion.  The 10-year ASCVD risk score Mikey Bussing DC Brooke Bonito., et al., 2013) is: 16.1%*   Values used to calculate the score:     Age: 39 years     Sex: Male     Is Non-Hispanic African American: No     Diabetic: No     Tobacco smoker: No     Systolic Blood Pressure: 242 mmHg     Is BP treated: No     HDL Cholesterol: 56 mg/dL*     Total Cholesterol: 238 mg/dL*     * - Cholesterol units were assumed for this score calculation  History of right retinal detachment in 08/2012. Sees a retinal specialist and glaucoma specialist.  Sees Dr. Denna Haggard yearly.  Current Outpatient Medications on File Prior to Visit  Medication Sig Dispense Refill  . brimonidine (ALPHAGAN P) 0.1 % SOLN     . Coenzyme Q10 100 MG capsule Take by mouth.    . magnesium 30 MG tablet Take 30 mg by mouth 2 (two) times daily.    [name] MILK THISTLE PO Take by mouth.    . Multiple Vitamin (MULTIVITAMIN) capsule Take by mouth.    . Omega-3 Fatty Acids (FISH OIL) 1000 MG CAPS Take by mouth.     No current facility-administered medications on file prior to visit.     No Known Allergies  Past Medical History:  Diagnosis Date  .  History of retinal detachment   . Hyperlipidemia   . Prostate cancer ([place])     No past surgical history on file.  No family history on file.  Social History   Socioeconomic History  . Marital status: Married    Spouse name: Not on file  . Number of children: Not on file  . Years of education: Not on file  . Highest education level: Not on file  Occupational History  . Not on file  Social Needs  . Financial resource strain: Not on file  . Food insecurity:    Worry: Not on file    Inability: Not on file  . Transportation needs:    Medical: Not on file    Non-medical: Not on file  Tobacco Use  . Smoking status: Never Smoker  . Smokeless tobacco: Never Used  Substance and Sexual Activity  . Alcohol use: Yes  . Drug use: Not on file  . Sexual activity: Not on file  Lifestyle  . Physical activity:    Days per week: Not on file    Minutes  per session: Not on file  . Stress: Not on file  Relationships  . Social connections:    Talks on phone: Not on file    Gets together: Not on file    Attends religious service: Not on file    Active member of club or organization: Not on file    Attends meetings of clubs or organizations: Not on file    Relationship status: Not on file  . Intimate partner violence:    Fear of current or ex partner: Not on file    Emotionally abused: Not on file    Physically abused: Not on file    Forced sexual activity: Not on file  Other Topics Concern  . Not on file  Social History Narrative   Moved here from [place], [place] in 05/2016   The PMH, [place], Social History, Family History, Medications, and allergies have been reviewed in [facility], and have been updated if relevant.   Review of Systems  Constitutional: Negative.   HENT: Negative.   Eyes: Negative.   Respiratory: Negative.   Cardiovascular: Negative.   Gastrointestinal: Negative.   Endocrine: Negative.   Genitourinary: Negative.   Musculoskeletal: Negative.   Skin: Negative.     Allergic/Immunologic: Negative.   Neurological: Negative.   Hematological: Negative.   Psychiatric/Behavioral: Negative.   All other systems reviewed and are negative.      Objective:    BP 121/86   Pulse 86   Temp 98.6 F (37 C) (Oral)   Ht 6\' 2"  (1.88 m)   Wt 222 lb (100.7 kg)   SpO2 96%   BMI 28.50 kg/m    Physical Exam  Constitutional: He is oriented to person, place, and time. He appears well-developed and well-nourished. No distress.  HENT:  Head: Normocephalic and atraumatic.  Eyes: Pupils are equal, round, and reactive to light. EOM are normal.  Neck: Normal range of motion.  Cardiovascular: Normal rate and regular rhythm.  Pulmonary/Chest: Effort normal and breath sounds normal.  Abdominal: Soft. Bowel sounds are normal.  Musculoskeletal: Normal range of motion. He exhibits no edema.  Neurological: He is alert and oriented to person, place, and time.  Skin: Skin is warm and dry. He is not diaphoretic.  Psychiatric: He has a normal mood and affect. His behavior is normal. Judgment and thought content normal.  Nursing note and vitals reviewed.         Assessment & Plan:   Need for influenza vaccination - Plan: Flu vaccine HIGH DOSE PF (Fluzone High dose)  Prostate cancer ([place])  Hyperlipidemia, unspecified hyperlipidemia type  Family history of malignant melanoma  History of retinal detachment  Vision loss, right eye No follow-ups on file.

## 2018-01-13 NOTE — Assessment & Plan Note (Signed)
Sees Dr. Denna Haggard yearly.

## 2018-01-13 NOTE — Assessment & Plan Note (Signed)
Followed by retinal and glaucoma specialists.

## 2018-02-03 NOTE — Progress Notes (Addendum)
Subjective:   Patient ID: Bryan Cowan, male    DOB: May 05, 1950, 67 y.o.   MRN: [id number]  Bryan Cowan is a pleasant 67 y.o. year old male who presents to clinic today with Follow-up (Patient is here today for a 35-month-F/U. He was seen as a new patient on 11.26.2019.  We completed labs being a PSA;Lipid;CMP;CBC;TSH which have already been addressed.  Colonoscopy next due 11.1.2023 was completed in [place] on 11.1.2013.  Declines Hep-C.)  on 02/04/2018  HPI:  Health Maintenance  Topic Date Due  . COLONOSCOPY  12/19/2021  . TETANUS/TDAP  06/18/2025  . INFLUENZA VACCINE  Completed  . PNA vac Low Risk Adult  Completed  . Hepatitis C Screening  Discontinued    Saw Gary Fleet, RN for annual medicare wellness visit this morning.  Established care with me on 01/13/18.  Note reviewed.  History of prostate CA- s/p TURP in 2004. PSA was zero in 02/2016.    He does not have a urologist here.  Has not had any changes in urinary symptoms.  We agreed that if his PSA remained stable (around zero) and he did not develop any new or worsening urinary symptoms, we would simply follow him here without getting urology involved.  If his PSA does increase or he does develop symptoms, will refer to urology. The patient indicates understanding of these issues and agrees with the plan. Lab Results  Component Value Date   PSA 0.03 (L) 01/13/2018   PSA <0.1 03/01/2016   HLD- LDL was  170 in 02/2016.   No known FH of CAD.  He has never taken a statin. Very active- walks 8 miles a day when he is working- works part time at [employer].  And also goes to the gym 3 days per week. Denies any HA, blurred vision,  CP, dizziness or SOB with exertion.  When we checked his cholesterol last month, LDL was better at 164 but still too high.  We agreed that we would recheck it today after working on his diet for month to see if he could improve his cholesterol without a statin.  I did also discuss the benefit of statin  therapy and his risk factors. Has changed his diet and has lost 6 pounds.  Feels very good.  The 10-year ASCVD risk score Mikey Bussing DC Brooke Bonito., et al., 2013) is: 16.8%   Values used to calculate the score:     Age: 67 years     Sex: Male     Is Non-Hispanic African American: No     Diabetic: No     Tobacco smoker: No     Systolic Blood Pressure: 001 mmHg     Is BP treated: No     HDL Cholesterol: 54.6 mg/dL     Total Cholesterol: 248 mg/dL  History of right retinal detachment in 08/2012. Sees a retinal specialist and glaucoma specialist.  Lab Results  Component Value Date   CREATININE 0.72 01/13/2018   Lab Results  Component Value Date   NA 138 01/13/2018   K 3.9 01/13/2018   CL 104 01/13/2018   CO2 24 01/13/2018   Lab Results  Component Value Date   ALT 16 01/13/2018   AST 22 01/13/2018   ALKPHOS 56 01/13/2018   BILITOT 0.7 01/13/2018   Current Outpatient Medications on File Prior to Visit  Medication Sig Dispense Refill  . brimonidine (ALPHAGAN P) 0.1 % SOLN     . Coenzyme Q10 100 MG capsule Take by  mouth.    . magnesium 30 MG tablet Take 30 mg by mouth 2 (two) times daily.    [name] MILK THISTLE PO Take by mouth.    . Multiple Vitamin (MULTIVITAMIN) capsule Take by mouth.    . Omega-3 Fatty Acids (FISH OIL) 1000 MG CAPS Take by mouth.     No current facility-administered medications on file prior to visit.     No Known Allergies  Past Medical History:  Diagnosis Date  . Basal cell carcinoma   . History of retinal detachment   . Hyperlipidemia   . Prostate cancer [facility])     Past Surgical History:  Procedure Laterality Date  . BASAL CELL CARCINOMA EXCISION  2019  . COLONOSCOPY  2013  . PROSTATECTOMY  2004  . RETINAL DETACHMENT SURGERY Right 2015  . RETINAL LASER PROCEDURE  2017  . SIGMOIDOSCOPY  2000    No family history on file.  Social History   Socioeconomic History  . Marital status: Married    Spouse name: Not on file  . Number of children: Not on file   . Years of education: Not on file  . Highest education level: Not on file  Occupational History  . Not on file  Social Needs  . Financial resource strain: Not on file  . Food insecurity:    Worry: Not on file    Inability: Not on file  . Transportation needs:    Medical: Not on file    Non-medical: Not on file  Tobacco Use  . Smoking status: Never Smoker  . Smokeless tobacco: Never Used  Substance and Sexual Activity  . Alcohol use: Yes    Comment: Beer per day  . Drug use: Never  . Sexual activity: Not Currently  Lifestyle  . Physical activity:    Days per week: Not on file    Minutes per session: Not on file  . Stress: Not on file  Relationships  . Social connections:    Talks on phone: Not on file    Gets together: Not on file    Attends religious service: Not on file    Active member of club or organization: Not on file    Attends meetings of clubs or organizations: Not on file    Relationship status: Not on file  . Intimate partner violence:    Fear of current or ex partner: Not on file    Emotionally abused: Not on file    Physically abused: Not on file    Forced sexual activity: Not on file  Other Topics Concern  . Not on file  Social History Narrative   Moved here from [place], [place] in 05/2015.   Works part time at [employer].   The PMH, PSH, Social History, Family History, Medications, and allergies have been reviewed in [facility], and have been updated if relevant.   Review of Systems  Constitutional: Negative.   HENT: Negative.   Eyes: Negative.   Respiratory: Negative.   Cardiovascular: Negative.   Gastrointestinal: Negative.   Endocrine: Negative.   Genitourinary: Negative.   Musculoskeletal: Negative.   Skin: Negative.   Allergic/Immunologic: Negative.   Neurological: Negative.   Psychiatric/Behavioral: Negative.   All other systems reviewed and are negative.     Objective:    BP 122/70 (BP Location: Left Arm, Patient Position: Sitting,  Cuff Size: Normal)   Pulse 78   Temp 97.9 F (36.6 C) (Oral)   Ht 6\' 2"  (1.88 m)   Wt 216  lb (98 kg)   SpO2 97%   BMI 27.73 kg/m   Wt Readings from Last 3 Encounters:  02/04/18 216 lb (98 kg)  02/04/18 216 lb 3.2 oz (98.1 kg)  01/13/18 222 lb (100.7 kg)    Physical Exam  General:  pleasant male in no acute distress Eyes:  PERRL Ears:  External ear exam shows no significant lesions or deformities.  TMs normal bilaterally Hearing is grossly normal bilaterally. Nose:  External nasal examination shows no deformity or inflammation. Nasal mucosa are pink and moist without lesions or exudates. Mouth:  Oral mucosa and oropharynx without lesions or exudates.  Teeth in good repair. Neck:  no carotid bruit or thyromegaly no cervical or supraclavicular lymphadenopathy  Lungs:  Normal respiratory effort, chest expands symmetrically. Lungs are clear to auscultation, no crackles or wheezes. Heart:  Normal rate and regular rhythm. S1 and S2 normal without gallop, murmur, click, rub or other extra sounds. Abdomen:  Bowel sounds positive,abdomen soft and non-tender without masses, organomegaly or hernias noted. Pulses:  R and L posterior tibial pulses are full and equal bilaterally  Extremities:  no edema  Psych:  Good eye contact, not anxious or depressed appearing       Assessment & Plan:   Hyperlipidemia, unspecified hyperlipidemia type - Plan: Lipid panel  Prostate cancer ([place])  Family history of malignant melanoma  History of retinal detachment No follow-ups on file.

## 2018-02-03 NOTE — Progress Notes (Signed)
Subjective:   Bryan Cowan is a 67 y.o. male who presents for an Initial Medicare Annual Wellness Visit.  Pt works part time at [employer] 30 hrs per week. Enjoys reading.  Review of Systems No ROS.  Medicare Wellness Visit. Additional risk factors are reflected in the social history. Cardiac Risk Factors include: advanced age (>76men, >76 women) Sleep patterns:  Sleeps 5-6 hrs. Feels rested Home Safety/Smoke Alarms: Feels safe in home. Smoke alarms in place. Lives in 1 story home with wife and dog.  Male:   CCS- pt reports last done 5 yrs ago. Pt reports bring records to scan at last OV. PSA-  Lab Results  Component Value Date   PSA 0.03 (L) 01/13/2018   PSA <0.1 03/01/2016      Objective:    Today's Vitals   02/04/18 0822  BP: 122/70  Pulse: 78  SpO2: 97%  Weight: 216 lb 3.2 oz (98.1 kg)  Height: 6\' 1"  (1.854 m)   Body mass index is 28.52 kg/m.  Advanced Directives 02/04/2018  Does Patient Have a Medical Advance Directive? Yes  Type of Paramedic of [place];Living will  Does patient want to make changes to medical advance directive? No - Patient declined  Copy of [place] in Chart? No - copy requested    Current Medications (verified) Outpatient Encounter Medications as of 02/04/2018  Medication Sig  . brimonidine (ALPHAGAN P) 0.1 % SOLN   . Coenzyme Q10 100 MG capsule Take by mouth.  . magnesium 30 MG tablet Take 30 mg by mouth 2 (two) times daily.  [name] MILK THISTLE PO Take by mouth.  . Multiple Vitamin (MULTIVITAMIN) capsule Take by mouth.  . Omega-3 Fatty Acids (FISH OIL) 1000 MG CAPS Take by mouth.   No facility-administered encounter medications on file as of 02/04/2018.     Allergies (verified) Patient has no known allergies.   History: Past Medical History:  Diagnosis Date  . Basal cell carcinoma   . History of retinal detachment   . Hyperlipidemia   . Prostate cancer [facility])    Past Surgical  History:  Procedure Laterality Date  . BASAL CELL CARCINOMA EXCISION  2019  . COLONOSCOPY  2013  . PROSTATECTOMY  2004  . RETINAL DETACHMENT SURGERY Right 2015  . RETINAL LASER PROCEDURE  2017  . SIGMOIDOSCOPY  2000   History reviewed. No pertinent family history. Social History   Socioeconomic History  . Marital status: Married    Spouse name: Not on file  . Number of children: Not on file  . Years of education: Not on file  . Highest education level: Not on file  Occupational History  . Not on file  Social Needs  . Financial resource strain: Not on file  . Food insecurity:    Worry: Not on file    Inability: Not on file  . Transportation needs:    Medical: Not on file    Non-medical: Not on file  Tobacco Use  . Smoking status: Never Smoker  . Smokeless tobacco: Never Used  Substance and Sexual Activity  . Alcohol use: Yes    Comment: Beer per day  . Drug use: Never  . Sexual activity: Not Currently  Lifestyle  . Physical activity:    Days per week: Not on file    Minutes per session: Not on file  . Stress: Not on file  Relationships  . Social connections:    Talks on phone: Not on  file    Gets together: Not on file    Attends religious service: Not on file    Active member of club or organization: Not on file    Attends meetings of clubs or organizations: Not on file    Relationship status: Not on file  Other Topics Concern  . Not on file  Social History Narrative   Moved here from [place], [place] in 05/2015.   Works part time at [employer].   Tobacco Counseling Counseling given: Not Answered   Clinical Intake: Pain : No/denies pain   Activities of Daily Living In your present state of health, do you have any difficulty performing the following activities: 02/04/2018  Hearing? Y  Comment hearing aid in both ears  Vision? Y  Comment detached retina right eye. wears glasses.  Difficulty concentrating or making decisions? N  Walking or climbing  stairs? N  Dressing or bathing? N  Doing errands, shopping? N  Preparing Food and eating ? N  Using the Toilet? N  In the past six months, have you accidently leaked urine? N  Do you have problems with loss of bowel control? N  Managing your Medications? N  Managing your Finances? N  Housekeeping or managing your Housekeeping? N     Immunizations and Health Maintenance Immunization History  Administered Date(s) Administered  . Influenza, High Dose Seasonal PF 11/19/2015, 01/13/2018  . Pneumococcal Conjugate-13 03/05/2016  . Pneumococcal Polysaccharide-23 01/13/2018  . Tdap 06/19/2015  . Zoster 02/28/2009   Health Maintenance Due  Topic Date Due  . Hepatitis C Screening  1950/04/06  . COLONOSCOPY  11/17/1998    Patient Care Team: [name], MD as PCP - General (Family Medicine)  Indicate any recent Medical Services you may have received from other than [facility] providers in the past year (date may be approximate).    Assessment:   This is a routine wellness examination for Bryan Cowan. Physical assessment deferred to PCP.  Hearing/Vision screen Hearing Screening Comments: Wearing hearing aids in both ears and reports no issues with the aids. Vision Screening Comments: Pt reports detached retina in Right eye with some peripheral vision only.  Left Eye: 20/20  Dietary issues and exercise activities discussed: Current Exercise Habits: Structured exercise class, Type of exercise: strength training/weights;treadmill, Time (Minutes): 60, Frequency (Times/Week): 6, Weekly Exercise (Minutes/Week): 360, Intensity: Moderate, Exercise limited by: None identified Diet (meal preparation, eat out, water intake, caffeinated beverages, dairy products, fruits and vegetables): in general, a "healthy" diet  , well balanced, on average, 2 meals per day      Goals    . Maintain healthy active lifestyle.      Depression Screen PHQ 2/9 Scores 02/04/2018  PHQ - 2 Score 0    Fall Risk Fall Risk   02/04/2018  Falls in the past year? 0    Cognitive Function: MMSE - Mini Mental State Exam 02/04/2018  Orientation to time 5  Orientation to Place 5  Registration 3  Attention/ Calculation 5  Recall 2  Language- name 2 objects 2  Language- repeat 1  Language- follow 3 step command 3  Language- read & follow direction 1  Write a sentence 1  Copy design 1  Total score 29        Screening Tests Health Maintenance  Topic Date Due  . Hepatitis C Screening  09/30/1950  . COLONOSCOPY  11/17/1998  . TETANUS/TDAP  06/18/2025  . INFLUENZA VACCINE  Completed  . PNA vac Low Risk Adult  Completed       Plan:  Please schedule your next medicare wellness visit with me in 1 yr.  Continue to eat heart healthy diet (full of fruits, vegetables, whole grains, lean protein, water--limit salt, fat, and sugar intake) and increase physical activity as tolerated.  Continue doing brain stimulating activities (puzzles, reading, adult coloring books, staying active) to keep memory sharp.   Bring a copy of your living will and/or healthcare power of attorney to your next office visit.    I have personally reviewed and noted the following in the patient's chart:   . Medical and social history . Use of alcohol, tobacco or illicit drugs  . Current medications and supplements . Functional ability and status . Nutritional status . Physical activity . Advanced directives . List of other physicians . Hospitalizations, surgeries, and ER visits in previous 12 months . Vitals . Screenings to include cognitive, depression, and falls . Referrals and appointments  In addition, I have reviewed and discussed with patient certain preventive protocols, quality metrics, and best practice recommendations. A written personalized care plan for preventive services as well as general preventive health recommendations were provided to patient.     Shela Nevin, [place]   02/04/2018

## 2018-02-04 ENCOUNTER — Ambulatory Visit (INDEPENDENT_AMBULATORY_CARE_PROVIDER_SITE_OTHER): Payer: Medicare Other | Admitting: *Deleted

## 2018-02-04 ENCOUNTER — Ambulatory Visit (INDEPENDENT_AMBULATORY_CARE_PROVIDER_SITE_OTHER): Payer: Medicare Other | Admitting: Family Medicine

## 2018-02-04 ENCOUNTER — Encounter: Payer: Self-pay | Admitting: *Deleted

## 2018-02-04 ENCOUNTER — Encounter: Payer: Self-pay | Admitting: Family Medicine

## 2018-02-04 VITALS — BP 122/70 | HR 78 | Temp 97.9°F | Ht 74.0 in | Wt 216.0 lb

## 2018-02-04 VITALS — BP 122/70 | HR 78 | Ht 73.0 in | Wt 216.2 lb

## 2018-02-04 DIAGNOSIS — E785 Hyperlipidemia, unspecified: Secondary | ICD-10-CM | POA: Diagnosis not present

## 2018-02-04 DIAGNOSIS — Z0000 Encounter for general adult medical examination without abnormal findings: Secondary | ICD-10-CM | POA: Diagnosis not present

## 2018-02-04 DIAGNOSIS — Z8669 Personal history of other diseases of the nervous system and sense organs: Secondary | ICD-10-CM

## 2018-02-04 DIAGNOSIS — Z808 Family history of malignant neoplasm of other organs or systems: Secondary | ICD-10-CM | POA: Diagnosis not present

## 2018-02-04 DIAGNOSIS — C61 Malignant neoplasm of prostate: Secondary | ICD-10-CM | POA: Diagnosis not present

## 2018-02-04 LAB — LIPID PANEL
CHOL/HDL RATIO: 5
Cholesterol: 248 mg/dL — ABNORMAL HIGH (ref 0–200)
HDL: 54.6 mg/dL (ref >39.00)
LDL CALC: 181 mg/dL — AB (ref 0–99)
NonHDL: 193.32
TRIGLYCERIDES: 63 mg/dL (ref 0.0–149.0)
VLDL: 12.6 mg/dL (ref 0.0–40.0)

## 2018-02-04 NOTE — Patient Instructions (Addendum)
Great to see you again, Bryan Cowan. I hope you have a very Happy Holiday.  I will call you with your lab results or you can view them online.

## 2018-02-04 NOTE — Assessment & Plan Note (Signed)
Has been working on diet.  Repeat lipid panel today. He is in agreement to starting statin if it remains elevated.

## 2018-02-04 NOTE — Assessment & Plan Note (Signed)
Followed closely by eye specialists.

## 2018-02-04 NOTE — Patient Instructions (Signed)
Please schedule your next medicare wellness visit with me in 1 yr.  Continue to eat heart healthy diet (full of fruits, vegetables, whole grains, lean protein, water--limit salt, fat, and sugar intake) and increase physical activity as tolerated.  Continue doing brain stimulating activities (puzzles, reading, adult coloring books, staying active) to keep memory sharp.   Bring a copy of your living will and/or healthcare power of attorney to your next office visit.   Mr. Bryan Cowan , Thank you for taking time to come for your Medicare Wellness Visit. I appreciate your ongoing commitment to your health goals. Please review the following plan we discussed and let me know if I can assist you in the future.   These are the goals we discussed: Goals    . Maintain healthy active lifestyle.       This is a list of the screening recommended for you and due dates:  Health Maintenance  Topic Date Due  .  Hepatitis C: One time screening is recommended by Center for Disease Control  (CDC) for  adults born from [birth year] through 1967.   Jan 05, 1951  . Colon Cancer Screening  11/17/1998  . Tetanus Vaccine  06/18/2025  . Flu Shot  Completed  . Pneumonia vaccines  Completed    Health Maintenance, Male A healthy lifestyle and preventive care is important for your health and wellness. Ask your health care provider about what schedule of regular examinations is right for you. What should I know about weight and diet? Eat a Healthy Diet  Eat plenty of vegetables, fruits, whole grains, low-fat dairy products, and lean protein.  Do not eat a lot of foods high in solid fats, added sugars, or salt.  Maintain a Healthy Weight Regular exercise can help you achieve or maintain a healthy weight. You should:  Do at least 150 minutes of exercise each week. The exercise should increase your heart rate and make you sweat (moderate-intensity exercise).  Do strength-training exercises at least twice a week. Watch Your  Levels of Cholesterol and Blood Lipids  Have your blood tested for lipids and cholesterol every 5 years starting at 69 years of age. If you are at high risk for heart disease, you should start having your blood tested when you are 69 years old. You may need to have your cholesterol levels checked more often if: ? Your lipid or cholesterol levels are high. ? You are older than 69 years of age. ? You are at high risk for heart disease. What should I know about cancer screening? Many types of cancers can be detected early and may often be prevented. Lung Cancer  You should be screened every year for lung cancer if: ? You are a current smoker who has smoked for at least 30 years. ? You are a former smoker who has quit within the past 15 years.  Talk to your health care provider about your screening options, when you should start screening, and how often you should be screened. Colorectal Cancer  Routine colorectal cancer screening usually begins at 69 years of age and should be repeated every 5-10 years until you are 69 years old. You may need to be screened more often if early forms of precancerous polyps or small growths are found. Your health care provider may recommend screening at an earlier age if you have risk factors for colon cancer.  Your health care provider may recommend using home test kits to check for hidden blood in the stool.  A small  camera at the end of a tube can be used to examine your colon (sigmoidoscopy or colonoscopy). This checks for the earliest forms of colorectal cancer. Prostate and Testicular Cancer  Depending on your age and overall health, your health care provider may do certain tests to screen for prostate and testicular cancer.  Talk to your health care provider about any symptoms or concerns you have about testicular or prostate cancer. Skin Cancer  Check your skin from head to toe regularly.  Tell your health care provider about any new moles or  changes in moles, especially if: ? There is a change in a mole's size, shape, or color. ? You have a mole that is larger than a pencil eraser.  Always use sunscreen. Apply sunscreen liberally and repeat throughout the day.  Protect yourself by wearing long sleeves, pants, a wide-brimmed hat, and sunglasses when outside. What should I know about heart disease, diabetes, and high blood pressure?  If you are 68-59 years of age, have your blood pressure checked every 3-5 years. If you are 44 years of age or older, have your blood pressure checked every year. You should have your blood pressure measured twice-once when you are at a hospital or clinic, and once when you are not at a hospital or clinic. Record the average of the two measurements. To check your blood pressure when you are not at a hospital or clinic, you can use: ? An automated blood pressure machine at a pharmacy. ? A home blood pressure monitor.  Talk to your health care provider about your target blood pressure.  If you are between 82-61 years old, ask your health care provider if you should take aspirin to prevent heart disease.  Have regular diabetes screenings by checking your fasting blood sugar level. ? If you are at a normal weight and have a low risk for diabetes, have this test once every three years after the age of 76. ? If you are overweight and have a high risk for diabetes, consider being tested at a younger age or more often.  A one-time screening for abdominal aortic aneurysm (AAA) by ultrasound is recommended for men aged 24-75 years who are current or former smokers. What should I know about preventing infection? Hepatitis B If you have a higher risk for hepatitis B, you should be screened for this virus. Talk with your health care provider to find out if you are at risk for hepatitis B infection. Hepatitis C Blood testing is recommended for:  Everyone born from [birth year] through 1967.  Anyone with known risk  factors for hepatitis C. Sexually Transmitted Diseases (STDs)  You should be screened each year for STDs including gonorrhea and chlamydia if: ? You are sexually active and are younger than 69 years of age. ? You are older than 69 years of age and your health care provider tells you that you are at risk for this type of infection. ? Your sexual activity has changed since you were last screened and you are at an increased risk for chlamydia or gonorrhea. Ask your health care provider if you are at risk.  Talk with your health care provider about whether you are at high risk of being infected with HIV. Your health care provider may recommend a prescription medicine to help prevent HIV infection. What else can I do?  Schedule regular health, dental, and eye exams.  Stay current with your vaccines (immunizations).  Do not use any tobacco products, such as cigarettes, chewing  tobacco, and e-cigarettes. If you need help quitting, ask your health care provider.  Limit alcohol intake to no more than 2 drinks per day. One drink equals 12 ounces of beer, 5 ounces of wine, or 1 ounces of hard liquor.  Do not use street drugs.  Do not share needles.  Ask your health care provider for help if you need support or information about quitting drugs.  Tell your health care provider if you often feel depressed.  Tell your health care provider if you have ever been abused or do not feel safe at home. This information is not intended to replace advice given to you by your health care provider. Make sure you discuss any questions you have with your health care provider. Document Released: 08/03/2007 Document Revised: 10/04/2015 Document Reviewed: 11/08/2014 Elsevier Interactive Patient Education  2019 [employer].

## 2018-02-04 NOTE — Assessment & Plan Note (Signed)
Yearly surveillance with PSA, which is reassuring.

## 2018-02-04 NOTE — Assessment & Plan Note (Signed)
Followed yearly by Dr. Denna Haggard.

## 2018-02-08 ENCOUNTER — Other Ambulatory Visit: Payer: Self-pay | Admitting: Family Medicine

## 2018-02-08 DIAGNOSIS — E785 Hyperlipidemia, unspecified: Secondary | ICD-10-CM

## 2018-02-13 NOTE — Progress Notes (Signed)
I reviewed health advisor's note, was available for consultation, and agree with documentation and plan.  

## 2018-03-25 ENCOUNTER — Ambulatory Visit: Payer: Medicare Other | Admitting: Cardiology

## 2018-04-06 DIAGNOSIS — H401113 Primary open-angle glaucoma, right eye, severe stage: Secondary | ICD-10-CM | POA: Diagnosis not present

## 2018-04-06 DIAGNOSIS — H33001 Unspecified retinal detachment with retinal break, right eye: Secondary | ICD-10-CM | POA: Diagnosis not present

## 2018-04-06 DIAGNOSIS — Z961 Presence of intraocular lens: Secondary | ICD-10-CM | POA: Diagnosis not present

## 2018-06-03 ENCOUNTER — Encounter: Payer: Self-pay | Admitting: Family Medicine

## 2018-07-20 DIAGNOSIS — H4720 Unspecified optic atrophy: Secondary | ICD-10-CM | POA: Diagnosis not present

## 2018-07-20 DIAGNOSIS — H33059 Total retinal detachment, unspecified eye: Secondary | ICD-10-CM | POA: Diagnosis not present

## 2018-07-20 DIAGNOSIS — H33051 Total retinal detachment, right eye: Secondary | ICD-10-CM | POA: Diagnosis not present

## 2018-07-20 DIAGNOSIS — H35412 Lattice degeneration of retina, left eye: Secondary | ICD-10-CM | POA: Diagnosis not present

## 2018-07-20 DIAGNOSIS — H401112 Primary open-angle glaucoma, right eye, moderate stage: Secondary | ICD-10-CM | POA: Diagnosis not present

## 2018-08-03 DIAGNOSIS — H33001 Unspecified retinal detachment with retinal break, right eye: Secondary | ICD-10-CM | POA: Diagnosis not present

## 2018-08-03 DIAGNOSIS — H35372 Puckering of macula, left eye: Secondary | ICD-10-CM | POA: Diagnosis not present

## 2018-08-03 DIAGNOSIS — Z961 Presence of intraocular lens: Secondary | ICD-10-CM | POA: Diagnosis not present

## 2018-08-03 DIAGNOSIS — H401113 Primary open-angle glaucoma, right eye, severe stage: Secondary | ICD-10-CM | POA: Diagnosis not present

## 2018-10-19 ENCOUNTER — Ambulatory Visit: Payer: Medicare Other | Admitting: Orthotics

## 2018-10-19 ENCOUNTER — Other Ambulatory Visit: Payer: Self-pay

## 2018-10-19 DIAGNOSIS — M7751 Other enthesopathy of right foot: Secondary | ICD-10-CM

## 2018-10-19 DIAGNOSIS — M779 Enthesopathy, unspecified: Secondary | ICD-10-CM

## 2018-10-19 DIAGNOSIS — R269 Unspecified abnormalities of gait and mobility: Secondary | ICD-10-CM

## 2018-10-29 ENCOUNTER — Ambulatory Visit: Payer: Medicare Other | Admitting: Family Medicine

## 2018-11-17 NOTE — Progress Notes (Signed)
Adjusted f/o 

## 2018-11-23 DIAGNOSIS — L720 Epidermal cyst: Secondary | ICD-10-CM | POA: Diagnosis not present

## 2018-11-23 DIAGNOSIS — L570 Actinic keratosis: Secondary | ICD-10-CM | POA: Diagnosis not present

## 2018-11-23 DIAGNOSIS — D229 Melanocytic nevi, unspecified: Secondary | ICD-10-CM | POA: Diagnosis not present

## 2018-11-30 ENCOUNTER — Other Ambulatory Visit: Payer: Self-pay | Admitting: Family Medicine

## 2018-11-30 DIAGNOSIS — Z96653 Presence of artificial knee joint, bilateral: Secondary | ICD-10-CM

## 2018-11-30 DIAGNOSIS — S8991XA Unspecified injury of right lower leg, initial encounter: Secondary | ICD-10-CM

## 2018-11-30 NOTE — Progress Notes (Signed)
Patient sent the following message:  "Dr. Deborra Medina,  I hope this finds you, your family and Dad doing well? I apologize for reaching out to you on a weekend, but I didn't want to wait until an appointment I have scheduled with you on Thursday for a different reason (intermittent abdominal swelling/distension).  I had a bilateral knee replacement in [place] in 2009 but yesterday experienced a sudden and distinct "pop" after which my knee seems to pop out of joint laterally. Since the surgery, this right knee has always been my "weakest" with some degree of inflammation, stiffness, reduced range of motion and minor pain.  At any rate, when the pop occurs, I feel sharp pain on the lateral side and the knee seems to actually pop out of the socket...as if the LCL is failing? ( I don't even know if there is still an LCL and ACL in artificial knees?!!) Anyway, I have almost ended on the floor several times because of the pain and loss of stability.  At any rate, could I trouble you to give me a referral to a local knee specialist?"   Referral placed.

## 2018-12-01 ENCOUNTER — Telehealth: Payer: Self-pay

## 2018-12-01 NOTE — Progress Notes (Signed)
Subjective:   Patient ID: Bryan Cowan, male    DOB: [birth year], 68 y.o.   MRN: [id number]  Bryan Cowan is a pleasant 68 y.o. year old male who presents to clinic today with Abdominal Pain (Patient is here today C/O abdominal pain which he feels may be a hernia.  He would like to get his flu shot today.)  on 12/02/2018  HPI:  .Left suprapubic pain- intermittent, has beenfeeling suprapubicpressure past 3-4 months. Often relieved when he has BM (not worsened).  Not sure what else makes it better or worse. He can feel a swollen area that comes and goes.  He doesn't think he can press it back in when it feels swollen.   Last colonoscopy in 2009.  Unsure if he had diverticulosis but he is unsure.  Denies any blood in stool, no changes or bowel habits.  Remote h/o radical prostatectomy in 2004 due to prostate CA.  Current Outpatient Medications on File Prior to Visit  Medication Sig Dispense Refill  . brimonidine (ALPHAGAN P) 0.1 % SOLN     . Coenzyme Q10 100 MG capsule Take 100 mg by mouth.     . magnesium 30 MG tablet Take 30 mg by mouth 2 (two) times daily.    [name] MILK THISTLE PO Take by mouth.    . Multiple Vitamin (MULTIVITAMIN) capsule Take by mouth.    . Omega-3 Fatty Acids (FISH OIL) 1000 MG CAPS Take by mouth.     No current facility-administered medications on file prior to visit.     No Known Allergies  Past Medical History:  Diagnosis Date  . Basal cell carcinoma   . History of retinal detachment   . Hyperlipidemia   . Prostate cancer [facility])     Past Surgical History:  Procedure Laterality Date  . BASAL CELL CARCINOMA EXCISION  2019  . COLONOSCOPY  2013  . PROSTATECTOMY  2004  . RETINAL DETACHMENT SURGERY Right 2015  . RETINAL LASER PROCEDURE  2017  . SIGMOIDOSCOPY  2000    No family history on file.  Social History   Socioeconomic History  . Marital status: Married    Spouse name: Not on file  . Number of children: Not on file  . Years of education: Not  on file  . Highest education level: Not on file  Occupational History  . Not on file  Social Needs  . Financial resource strain: Not on file  . Food insecurity    Worry: Not on file    Inability: Not on file  . Transportation needs    Medical: Not on file    Non-medical: Not on file  Tobacco Use  . Smoking status: Never Smoker  . Smokeless tobacco: Never Used  Substance and Sexual Activity  . Alcohol use: Yes    Comment: Beer per day  . Drug use: Never  . Sexual activity: Not Currently  Lifestyle  . Physical activity    Days per week: Not on file    Minutes per session: Not on file  . Stress: Not on file  Relationships  . Social Herbalist on phone: Not on file    Gets together: Not on file    Attends religious service: Not on file    Active member of club or organization: Not on file    Attends meetings of clubs or organizations: Not on file    Relationship status: Not on file  . Intimate partner violence  Fear of current or ex partner: Not on file    Emotionally abused: Not on file    Physically abused: Not on file    Forced sexual activity: Not on file  Other Topics Concern  . Not on file  Social History Narrative   Moved here from [place], [place] in 05/2015.   Works part time at [employer].   The PMH, PSH, Social History, Family History, Medications, and allergies have been reviewed in [facility], and have been updated if relevant.   Review of Systems  Constitutional: Negative.   HENT: Negative.   Eyes: Negative.   Respiratory: Negative.   Cardiovascular: Negative.   Gastrointestinal: Positive for abdominal pain. Negative for abdominal distention, anal bleeding, blood in stool, constipation, diarrhea, nausea, rectal pain and vomiting.  Endocrine: Negative.   Genitourinary: Negative.   Musculoskeletal: Negative.   Skin: Negative.   Allergic/Immunologic: Negative.   Neurological: Negative.   Hematological: Negative.   Psychiatric/Behavioral:  Negative.   All other systems reviewed and are negative.      Objective:    BP 120/80 (BP Location: Left Arm, Patient Position: Sitting, Cuff Size: Normal)   Pulse (!) 58   Temp 98 F (36.7 C) (Oral)   Ht 6\' 2"  (1.88 m)   Wt 221 lb (100.2 kg)   SpO2 97%   BMI 28.37 kg/m   Wt Readings from Last 3 Encounters:  12/02/18 221 lb (100.2 kg)  02/04/18 216 lb (98 kg)  02/04/18 216 lb 3.2 oz (98.1 kg)    Physical Exam Vitals signs and nursing note reviewed.  Constitutional:      General: He is not in acute distress.    Appearance: He is well-developed. He is not ill-appearing or diaphoretic.  HENT:     Head: Normocephalic and atraumatic.  Eyes:     Pupils: Pupils are equal, round, and reactive to light.  Cardiovascular:     Rate and Rhythm: Normal rate and regular rhythm.  Abdominal:     General: Abdomen is flat. Bowel sounds are normal. There is no distension or abdominal bruit.     Palpations: Abdomen is soft. There is no fluid wave.     Tenderness: There is abdominal tenderness in the left lower quadrant. There is no right CVA tenderness, left CVA tenderness, guarding or rebound. Negative signs include Murphy's sign, Rovsing's sign, McBurney's sign, psoas sign and obturator sign.     Comments: ? Left inguinal hernia but not appreciated on exam today  Genitourinary:    Rectum: Normal.  Skin:    General: Skin is warm and dry.  Neurological:     General: No focal deficit present.     Mental Status: He is alert and oriented to person, place, and time.     Cranial Nerves: No cranial nerve deficit.     Motor: No weakness.  Psychiatric:        Mood and Affect: Mood normal.        Behavior: Behavior normal.           Assessment & Plan:   Left lower quadrant abdominal pain - Plan: CT ABDOMEN PELVIS W WO CONTRAST, CBC with Differential/Platelet, Comprehensive metabolic panel  Need for immunization against influenza - Plan: Flu Vaccine QUAD High Dose(Fluad)  Prostate  cancer ([place]) - Plan: PSA  Left lower quadrant pain  History of prostate surgery - Plan: PSA  Left upper quadrant abdominal pain - Plan: CT Abdomen Pelvis W Contrast No follow-ups on file.

## 2018-12-01 NOTE — Telephone Encounter (Signed)
Questions for Screening COVID-19   Symptom onset: None  Travel or Contacts: None  During this illness, did/does the patient experience any of the following symptoms? Fever >100.100F []   Yes [x]   No []   Unknown Subjective fever (felt feverish) []   Yes [x]   No []   Unknown Chills []   Yes [x]   No []   Unknown Muscle aches (myalgia) []   Yes [x]   No []   Unknown Runny nose (rhinorrhea) []   Yes [x]   No []   Unknown Sore throat []   Yes [x]   No []   Unknown Cough (new onset or worsening of chronic cough) []   Yes [x]   No []   Unknown Shortness of breath (dyspnea) []   Yes [x]   No []   Unknown Nausea or vomiting []   Yes [x]   No []   Unknown Headache []   Yes [x]   No []   Unknown Abdominal pain  []   Yes [x]   No []   Unknown Diarrhea (?3 loose/looser than normal stools/24hr period) []   Yes [x]   No []   Unknown Other, specify:  Patient risk factors: Smoker? []   Current []   Former []   Never If male, currently pregnant? []   Yes []   No  Patient Active Problem List   Diagnosis Date Noted  . Family history of malignant melanoma 01/13/2018  . History of retinal detachment 01/13/2018  . Vision loss, right eye 01/13/2018  . Prostate cancer ([place])   . Hyperlipidemia     Plan:  []   High risk for COVID-19 with red flags go to ED (with CP, SOB, weak/lightheaded, or fever > 101.5). Call ahead.  []   High risk for COVID-19 but stable. Inform provider and coordinate time for [facility] visit.   []   No red flags but URI signs or symptoms okay for [facility] visit.

## 2018-12-02 ENCOUNTER — Encounter (HOSPITAL_BASED_OUTPATIENT_CLINIC_OR_DEPARTMENT_OTHER): Payer: Self-pay

## 2018-12-02 ENCOUNTER — Ambulatory Visit (INDEPENDENT_AMBULATORY_CARE_PROVIDER_SITE_OTHER): Payer: Medicare Other | Admitting: Family Medicine

## 2018-12-02 ENCOUNTER — Other Ambulatory Visit: Payer: Self-pay

## 2018-12-02 ENCOUNTER — Encounter: Payer: Self-pay | Admitting: Family Medicine

## 2018-12-02 ENCOUNTER — Other Ambulatory Visit (HOSPITAL_BASED_OUTPATIENT_CLINIC_OR_DEPARTMENT_OTHER): Payer: Self-pay | Admitting: Emergency Medicine

## 2018-12-02 ENCOUNTER — Ambulatory Visit (HOSPITAL_BASED_OUTPATIENT_CLINIC_OR_DEPARTMENT_OTHER)
Admission: RE | Admit: 2018-12-02 | Discharge: 2018-12-02 | Disposition: A | Discharge status: Home / Self Care | Payer: Medicare Other | Source: Ambulatory Visit | Attending: Family Medicine | Admitting: Family Medicine

## 2018-12-02 VITALS — BP 120/80 | HR 58 | Temp 98.0°F | Ht 74.0 in | Wt 221.0 lb

## 2018-12-02 DIAGNOSIS — Z9889 Other specified postprocedural states: Secondary | ICD-10-CM | POA: Diagnosis not present

## 2018-12-02 DIAGNOSIS — R109 Unspecified abdominal pain: Secondary | ICD-10-CM

## 2018-12-02 DIAGNOSIS — R1032 Left lower quadrant pain: Secondary | ICD-10-CM

## 2018-12-02 DIAGNOSIS — C61 Malignant neoplasm of prostate: Secondary | ICD-10-CM | POA: Diagnosis not present

## 2018-12-02 DIAGNOSIS — R1012 Left upper quadrant pain: Secondary | ICD-10-CM | POA: Insufficient documentation

## 2018-12-02 DIAGNOSIS — Z23 Encounter for immunization: Secondary | ICD-10-CM

## 2018-12-02 DIAGNOSIS — N3289 Other specified disorders of bladder: Secondary | ICD-10-CM | POA: Diagnosis not present

## 2018-12-02 LAB — PSA: PSA: 0.04 ng/mL — ABNORMAL LOW (ref 0.10–4.00)

## 2018-12-02 LAB — COMPREHENSIVE METABOLIC PANEL
ALT: 17 U/L (ref 0–53)
AST: 23 U/L (ref 0–37)
Albumin: 4.4 g/dL (ref 3.5–5.2)
Alkaline Phosphatase: 53 U/L (ref 39–117)
BUN: 11 mg/dL (ref 6–23)
CO2: 25 mEq/L (ref 19–32)
Calcium: 9.2 mg/dL (ref 8.4–10.5)
Chloride: 103 mEq/L (ref 96–112)
Creatinine, Ser: 0.66 mg/dL (ref 0.40–1.50)
GFR: 119.31 mL/min (ref >60.00)
Glucose, Bld: 99 mg/dL (ref 70–99)
Potassium: 3.8 mEq/L (ref 3.5–5.1)
Sodium: 137 mEq/L (ref 135–145)
Total Bilirubin: 0.7 mg/dL (ref 0.2–1.2)
Total Protein: 6.9 g/dL (ref 6.0–8.3)

## 2018-12-02 LAB — CBC WITH DIFFERENTIAL/PLATELET
Basophils Absolute: 0 10*3/uL (ref 0.0–0.1)
Basophils Relative: 0.6 % (ref 0.0–3.0)
Eosinophils Absolute: 0.3 10*3/uL (ref 0.0–0.7)
Eosinophils Relative: 4.7 % (ref 0.0–5.0)
HCT: 42.7 % (ref 39.0–52.0)
Hemoglobin: 14.1 g/dL (ref 13.0–17.0)
Lymphocytes Relative: 32.7 % (ref 12.0–46.0)
Lymphs Abs: 1.7 10*3/uL (ref 0.7–4.0)
MCHC: 33 g/dL (ref 30.0–36.0)
MCV: 83.7 fl (ref 78.0–100.0)
Monocytes Absolute: 0.5 10*3/uL (ref 0.1–1.0)
Monocytes Relative: 9.5 % (ref 3.0–12.0)
Neutro Abs: 2.8 10*3/uL (ref 1.4–7.7)
Neutrophils Relative %: 52.5 % (ref 43.0–77.0)
Platelets: 214 10*3/uL (ref 150.0–400.0)
RBC: 5.09 Mil/uL (ref 4.22–5.81)
RDW: 13.1 % (ref 11.5–15.5)
WBC: 5.3 10*3/uL (ref 4.0–10.5)

## 2018-12-02 MED ORDER — IOHEXOL 300 MG/ML  SOLN: 100.0000 mL | Freq: Once | INTRAMUSCULAR | Status: DC | PRN

## 2018-12-02 MED ORDER — IOHEXOL 300 MG/ML  SOLN
100.0000 mL | Freq: Once | INTRAMUSCULAR | Status: AC | PRN
  Administered 2018-12-02: 100 mL via INTRAVENOUS

## 2018-12-02 NOTE — Assessment & Plan Note (Signed)
Discussed with pt- certainly could be a hernia but this is a complicated situation as I did not palpate one on exam and some of his symptoms could be consistent with acute diverticulitis (no recent colonoscopy)( or other abdominal/pelvic pathology given the pressure he describes and his history of abdominal/pelvic surgery. Given duration and progression of symptoms, I feel it medically appropriate to order a CT of abdomen and pelvis for further evaluation.  Will also order lab work today.  Orders Placed This Encounter  Procedures  . CT ABDOMEN PELVIS W WO CONTRAST  . CT Abdomen Pelvis W Contrast  . Flu Vaccine QUAD High Dose(Fluad)  . CBC with Differential/Platelet  . Comprehensive metabolic panel  . PSA   The patient indicates understanding of these issues and agrees with the plan.

## 2018-12-02 NOTE — Patient Instructions (Addendum)
Great to see you.   [phone]- Dr. Calton Golds at [facility] in [place] to get his records ASAP Fax: [phone] ATTN: STAT -  [place] [address]      We will call you with your CT scan and lab results :)

## 2018-12-03 ENCOUNTER — Other Ambulatory Visit: Payer: Self-pay | Admitting: Family Medicine

## 2018-12-03 ENCOUNTER — Telehealth: Payer: Self-pay

## 2018-12-03 ENCOUNTER — Ambulatory Visit: Payer: Medicare Other | Admitting: Family Medicine

## 2018-12-03 DIAGNOSIS — R1032 Left lower quadrant pain: Secondary | ICD-10-CM

## 2018-12-03 DIAGNOSIS — R1904 Left lower quadrant abdominal swelling, mass and lump: Secondary | ICD-10-CM

## 2018-12-03 NOTE — Telephone Encounter (Signed)
I spoke with pt and he informed me that he had already spoken w/Dr. Deborra Medina.

## 2018-12-03 NOTE — Telephone Encounter (Signed)
Copied from [place] [id number]. Topic: General - Other >> Dec 03, 2018  9:03 AM Rainey Pines A wrote: Patient would like a callback from nurse in [place] to ct scan results

## 2018-12-07 ENCOUNTER — Ambulatory Visit: Payer: Medicare Other | Admitting: Family Medicine

## 2018-12-15 DIAGNOSIS — K5909 Other constipation: Secondary | ICD-10-CM | POA: Diagnosis not present

## 2018-12-15 DIAGNOSIS — R1032 Left lower quadrant pain: Secondary | ICD-10-CM | POA: Diagnosis not present

## 2018-12-18 DIAGNOSIS — M25361 Other instability, right knee: Secondary | ICD-10-CM | POA: Diagnosis not present

## 2018-12-18 DIAGNOSIS — Z96651 Presence of right artificial knee joint: Secondary | ICD-10-CM | POA: Diagnosis not present

## 2018-12-18 DIAGNOSIS — M25561 Pain in right knee: Secondary | ICD-10-CM | POA: Diagnosis not present

## 2018-12-29 ENCOUNTER — Encounter: Payer: Self-pay | Admitting: Family Medicine

## 2019-01-21 ENCOUNTER — Ambulatory Visit: Payer: Medicare Other | Admitting: Nurse Practitioner

## 2019-01-28 ENCOUNTER — Ambulatory Visit (INDEPENDENT_AMBULATORY_CARE_PROVIDER_SITE_OTHER): Payer: Medicare Other | Admitting: Physician Assistant

## 2019-01-28 ENCOUNTER — Other Ambulatory Visit: Payer: Self-pay

## 2019-01-28 ENCOUNTER — Encounter: Payer: Self-pay | Admitting: Physician Assistant

## 2019-01-28 VITALS — BP 132/80 | HR 63 | Temp 97.0°F | Ht 73.0 in | Wt 221.0 lb

## 2019-01-28 DIAGNOSIS — R1032 Left lower quadrant pain: Secondary | ICD-10-CM

## 2019-01-28 DIAGNOSIS — Z1211 Encounter for screening for malignant neoplasm of colon: Secondary | ICD-10-CM

## 2019-01-28 DIAGNOSIS — Z1159 Encounter for screening for other viral diseases: Secondary | ICD-10-CM | POA: Diagnosis not present

## 2019-01-28 MED ORDER — NA SULFATE-K SULFATE-MG SULF 17.5-3.13-1.6 GM/177ML PO SOLN: 1.0000 | Freq: Once | ORAL | 0 refills | Status: AC

## 2019-01-28 NOTE — Progress Notes (Signed)
Subjective:    Patient ID: Bryan Cowan, male    DOB: [birth year], 68 y.o.   MRN: [id number]  HPI Bryan Cowan is a pleasant 68 year old white male, new to GI today, referred by Dr. Arnette Cowan with complaints of lower abdominal pain. Patient does have history of prostate cancer and is status post radical prostatectomy in 2004, has history of hyperlipidemia and previous retinal detachment. He has had prior colonoscopy, the last was done in 2009 in [facility].  He did bring a copy of that report with him today and he was noted to have one 6 mm polyp at the cecum which was removed and had otherwise a negative exam.  I do not have copy of the path report but the polyp was sessile. Patient says he has been having his current symptoms over the past 4 to 6 months with intermittent left lower quadrant pain.  He says he is had this pain about 15 times over the past month and generally if it is present it may last for an hour or 2 and then resolves.  He had an episode last week with very sharp right lower quadrant pain which was very transient and doubled him over. He has noticed the left lower quadrant pain primarily in the mornings, and there seems to be relief with a bowel movement.  He thought he might have an inguinal hernia as he had noticed a protuberance in that area at times.  He was evaluated by [facility]/Dr. Cornett.  I have copy of his notes.  There is no evidence of inguinal hernia bilaterally. He was scheduled for CT scan of the abdomen and pelvis which was done in October 2020 which showed changes of old granulomatous disease of the liver and spleen, small hepatic cysts, there is no evidence of hydronephrosis, no adenopathy, and no bowel abnormality noted.  There was stool throughout the colon. He says his bowel movements have not been quite as regular as usual now going every day and 1/2 to 2 days.  He has not noted any melena or hematochezia.  Review of Systems Pertinent positive and negative  review of systems were noted in the above HPI section.  All other review of systems was otherwise negative.  Outpatient Encounter Medications as of 01/28/2019  Medication Sig  . brimonidine (ALPHAGAN P) 0.1 % SOLN   . Coenzyme Q10 100 MG capsule Take 100 mg by mouth.   . magnesium 30 MG tablet Take 30 mg by mouth 2 (two) times daily.  [name] MILK THISTLE PO Take by mouth.  . Multiple Vitamin (MULTIVITAMIN) capsule Take by mouth.  . Omega-3 Fatty Acids (FISH OIL) 1000 MG CAPS Take by mouth.  . Na Sulfate-K Sulfate-Mg Sulf 17.5-3.13-1.6 GM/177ML SOLN Take 1 kit by mouth once for 1 dose.   No facility-administered encounter medications on file as of 01/28/2019.   No Known Allergies Patient Active Problem List   Diagnosis Date Noted  . Left lower quadrant abdominal pain 12/02/2018  . History of prostate surgery 12/02/2018  . Left upper quadrant abdominal pain 12/02/2018  . Family history of malignant melanoma 01/13/2018  . History of retinal detachment 01/13/2018  . Vision loss, right eye 01/13/2018  . Prostate cancer ([place])   . Hyperlipidemia    Social History   Socioeconomic History  . Marital status: Married    Spouse name: Not on file  . Number of children: Not on file  . Years of education: Not on file  . Highest education  level: Not on file  Occupational History  . Not on file  Tobacco Use  . Smoking status: Never Smoker  . Smokeless tobacco: Never Used  Substance and Sexual Activity  . Alcohol use: Yes    Comment: Beer per day  . Drug use: Never  . Sexual activity: Not Currently  Other Topics Concern  . Not on file  Social History Narrative   Moved here from [place], [place] in 05/2015.   Works part time at [employer].   Social Determinants of Health   Financial Resource Strain:   . Difficulty of Paying Living Expenses: Not on file  Food Insecurity:   . Worried About Charity fundraiser in the Last Year: Not on file  . Ran Out of Food in the Last Year: Not on file    Transportation Needs:   . Lack of Transportation (Medical): Not on file  . Lack of Transportation (Non-Medical): Not on file  Physical Activity:   . Days of Exercise per Week: Not on file  . Minutes of Exercise per Session: Not on file  Stress:   . Feeling of Stress : Not on file  Social Connections:   . Frequency of Communication with Friends and Family: Not on file  . Frequency of Social Gatherings with Friends and Family: Not on file  . Attends Religious Services: Not on file  . Active Member of Clubs or Organizations: Not on file  . Attends Archivist Meetings: Not on file  . Marital Status: Not on file  Intimate Partner Violence:   . Fear of Current or Ex-Partner: Not on file  . Emotionally Abused: Not on file  . Physically Abused: Not on file  . Sexually Abused: Not on file    Mr. Bryan Cowan family history is not on file.      Objective:    Vitals:   01/28/19 0835  BP: 132/80  Pulse: 63  Temp: (!) 97 F (36.1 C)    Physical Exam Well-developed well-nourished  Older male in no acute distress. , [id number], BMI 29.16  HEENT; nontraumatic normocephalic, EOMI, PER R LA, sclera anicteric. Oropharynx; not examined /mask/ Covid Neck; supple, no JVD Cardiovascular; regular rate and rhythm with S1-S2, no murmur rub or gallop Pulmonary; Clear bilaterally Abdomen; soft, nontender, nondistended, no palpable mass or hepatosplenomegaly, bowel sounds are active.  No palpable inguinal hernia Rectal; not done today Skin; benign exam, no jaundice rash or appreciable lesions Extremities; no clubbing cyanosis or edema skin warm and dry Neuro/Psych; alert and oriented x4, grossly nonfocal mood and affect appropriate       Assessment & Plan:   #70 68 year old white male with 4 to 21-monthhistory of intermittent left lower quadrant pain/discomfort, and some change in bowel habits with less regular bowel movements. No evidence of inguinal hernia by CT. Etiology of pain  is not clear, no diverticulosis or other colonic abnormality noted on recent CT.  #2 history of colon polyps[place] sessile polyp of the cecum.  Do not have path report suspect adenomatous  #3 remote history of prostate CA status post radical prostatectomy 2004  Plan patient will be scheduled for Colonoscopy with Dr. PHenrene Pastor  Procedure was discussed in detail with the patient including indications risks and benefits and he is agreeable to proceed. Further recommendations pending findings   AAlfredia FergusonPA-C 01/28/2019   Cc: ALucille Passy MD

## 2019-01-28 NOTE — Progress Notes (Signed)
Assessment and plan reviewed 

## 2019-01-28 NOTE — Patient Instructions (Signed)
You have been scheduled for a colonoscopy. Please follow written instructions given to you at your visit today.  Please pick up your prep supplies at the pharmacy within the next 1-3 days. If you use inhalers (even only as needed), please bring them with you on the day of your procedure.   I appreciate the opportunity to care for you. Amy Esterwood, PA-C

## 2019-02-04 ENCOUNTER — Ambulatory Visit (INDEPENDENT_AMBULATORY_CARE_PROVIDER_SITE_OTHER): Payer: Medicare Other

## 2019-02-04 ENCOUNTER — Other Ambulatory Visit: Payer: Self-pay | Admitting: Internal Medicine

## 2019-02-04 DIAGNOSIS — Z1159 Encounter for screening for other viral diseases: Secondary | ICD-10-CM

## 2019-02-04 LAB — SARS CORONAVIRUS 2 (TAT 6-24 HRS): SARS Coronavirus 2: NEGATIVE

## 2019-02-08 ENCOUNTER — Ambulatory Visit (AMBULATORY_SURGERY_CENTER): Payer: Medicare Other | Admitting: Internal Medicine

## 2019-02-08 ENCOUNTER — Other Ambulatory Visit: Payer: Self-pay

## 2019-02-08 ENCOUNTER — Encounter: Payer: Self-pay | Admitting: Internal Medicine

## 2019-02-08 VITALS — BP 121/77 | HR 64 | Temp 97.4°F | Resp 19 | Ht 73.0 in | Wt 221.0 lb

## 2019-02-08 DIAGNOSIS — R1032 Left lower quadrant pain: Secondary | ICD-10-CM

## 2019-02-08 DIAGNOSIS — D123 Benign neoplasm of transverse colon: Secondary | ICD-10-CM | POA: Diagnosis not present

## 2019-02-08 DIAGNOSIS — K5730 Diverticulosis of large intestine without perforation or abscess without bleeding: Secondary | ICD-10-CM | POA: Diagnosis not present

## 2019-02-08 DIAGNOSIS — K6289 Other specified diseases of anus and rectum: Secondary | ICD-10-CM | POA: Diagnosis not present

## 2019-02-08 DIAGNOSIS — K648 Other hemorrhoids: Secondary | ICD-10-CM

## 2019-02-08 DIAGNOSIS — D120 Benign neoplasm of cecum: Secondary | ICD-10-CM

## 2019-02-08 DIAGNOSIS — D128 Benign neoplasm of rectum: Secondary | ICD-10-CM | POA: Diagnosis not present

## 2019-02-08 DIAGNOSIS — D122 Benign neoplasm of ascending colon: Secondary | ICD-10-CM

## 2019-02-08 MED ORDER — SODIUM CHLORIDE 0.9 % IV SOLN: 500.0000 mL | Freq: Once | INTRAVENOUS | Status: DC

## 2019-02-08 NOTE — Op Note (Signed)
Real Patient Name: Bryan Cowan Procedure Date: 02/08/2019 8:02 AM MRN: [id number] Endoscopist: Docia Chuck. Henrene Pastor , MD Age: 68 Referring MD:  Date of Birth: Jul 20, 1950 Gender: Male Account #: [id number] Procedure:                Colonoscopy with cold snare polypectomy x 7 Indications:              Abdominal pain in the left lower quadrant, Change                            in bowel habits. Colonoscopy in [facility] 2009 with "polyps". Medicines:                Monitored Anesthesia Care Procedure:                Pre-Anesthesia Assessment:                           - Prior to the procedure, a History and Physical                            was performed, and patient medications and                            allergies were reviewed. The patient's tolerance of                            previous anesthesia was also reviewed. The risks                            and benefits of the procedure and the sedation                            options and risks were discussed with the patient.                            All questions were answered, and informed consent                            was obtained. Prior Anticoagulants: The patient has                            taken no previous anticoagulant or antiplatelet                            agents. ASA Grade Assessment: II - A patient with                            mild systemic disease. After reviewing the risks                            and benefits, the patient was deemed in  satisfactory condition to undergo the procedure.                           After obtaining informed consent, the colonoscope                            was passed under direct vision. Throughout the                            procedure, the patient's blood pressure, pulse, and                            oxygen saturations were monitored continuously. The   Colonoscope was introduced through the anus and                            advanced to the the cecum, identified by                            appendiceal orifice and ileocecal valve. The                            ileocecal valve, appendiceal orifice, and rectum                            were photographed. The quality of the bowel                            preparation was excellent. The colonoscopy was                            performed without difficulty. The patient tolerated                            the procedure well. The bowel preparation used was                            SUPREP via split dose instruction. Scope In: 8:08:39 AM Scope Out: 8:31:11 AM Scope Withdrawal Time: 0 hours 19 minutes 35 seconds  Total Procedure Duration: 0 hours 22 minutes 32 seconds  Findings:                 Seven polyps were found in the rectum, transverse                            colon, ascending colon and cecum. The polyps were 2                            to 5 mm in size. These polyps were removed with a                            cold snare. Resection and retrieval were complete.  Multiple small-mouthed diverticula were found in                            the sigmoid colon.                           Mild melanosis coli throughout. The exam was                            otherwise without abnormality on direct and                            retroflexion views. Internal hemorrhoids present Complications:            No immediate complications. Estimated blood loss:                            None. Estimated Blood Loss:     Estimated blood loss: none. Impression:               - Seven 2 to 5 mm polyps in the rectum, in the                            transverse colon, in the ascending colon and in the                            cecum, removed with a cold snare. Resected and                            retrieved.                           - Diverticulosis in the sigmoid  colon. Melanosis                            coli. Internal hemorrhoids present                           - The examination was otherwise normal on direct                            and retroflexion views. Recommendation:           - Repeat colonoscopy in 3 years for surveillance.                           - Patient has a contact number available for                            emergencies. The signs and symptoms of potential                            delayed complications were discussed with the                            patient. Return to normal activities tomorrow.  Written discharge instructions were provided to the                            patient.                           - Resume previous diet.                           - Continue present medications.                           - Await pathology results. Docia Chuck. Henrene Pastor, MD 02/08/2019 8:47:59 AM This report has been signed electronically.

## 2019-02-08 NOTE — Patient Instructions (Signed)
7 polyps removed today. Diverticulosis and melanosis noted. Repeat colonoscopy in 3 years.     YOU HAD AN ENDOSCOPIC PROCEDURE TODAY AT [facility]:   Refer to the procedure report that was given to you for any specific questions about what was found during the examination.  If the procedure report does not answer your questions, please call your gastroenterologist to clarify.  If you requested that your care partner not be given the details of your procedure findings, then the procedure report has been included in a sealed envelope for you to review at your convenience later.  YOU SHOULD EXPECT: Some feelings of bloating in the abdomen. Passage of more gas than usual.  Walking can help get rid of the air that was put into your GI tract during the procedure and reduce the bloating. If you had a lower endoscopy (such as a colonoscopy or flexible sigmoidoscopy) you may notice spotting of blood in your stool or on the toilet paper. If you underwent a bowel prep for your procedure, you may not have a normal bowel movement for a few days.  Please Note:  You might notice some irritation and congestion in your nose or some drainage.  This is from the oxygen used during your procedure.  There is no need for concern and it should clear up in a day or so.  SYMPTOMS TO REPORT IMMEDIATELY:   Following lower endoscopy (colonoscopy or flexible sigmoidoscopy):  Excessive amounts of blood in the stool  Significant tenderness or worsening of abdominal pains  Swelling of the abdomen that is new, acute  Fever of 100F or higher   For urgent or emergent issues, a gastroenterologist can be reached at any hour by calling [phone].   DIET:  We do recommend a small meal at first, but then you may proceed to your regular diet.  Drink plenty of fluids but you should avoid alcoholic beverages for 24 hours.  ACTIVITY:  You should plan to take it easy for the rest of today and you should NOT  DRIVE or use heavy machinery until tomorrow (because of the sedation medicines used during the test).    FOLLOW UP: Our staff will call the number listed on your records 48-72 hours following your procedure to check on you and address any questions or concerns that you may have regarding the information given to you following your procedure. If we do not reach you, we will leave a message.  We will attempt to reach you two times.  During this call, we will ask if you have developed any symptoms of COVID 19. If you develop any symptoms (ie: fever, flu-like symptoms, shortness of breath, cough etc.) before then, please call [phone].  If you test positive for Covid 19 in the 2 weeks post procedure, please call and report this information to [place].    If any biopsies were taken you will be contacted by phone or by letter within the next 1-3 weeks.  Please call us at [phone] if you have not heard about the biopsies in 3 weeks.    SIGNATURES/CONFIDENTIALITY: You and/or your care partner have signed paperwork which will be entered into your electronic medical record.  These signatures attest to the fact that that the information above on your After Visit Summary has been reviewed and is understood.  Full responsibility of the confidentiality of this discharge information lies with you and/or your care-partner.

## 2019-02-08 NOTE — Progress Notes (Signed)
Called to room to assist during endoscopic procedure.  Patient ID and intended procedure confirmed with present staff. Received instructions for my participation in the procedure from the performing physician.  

## 2019-02-08 NOTE — Progress Notes (Signed)
PT taken to PACU. Monitors in place. VSS. Report given to RN. 

## 2019-02-08 NOTE — Progress Notes (Signed)
Temp check by:JB Vital check by:DT  The medical and surgical history was reviewed and verified with the patient. 

## 2019-02-10 ENCOUNTER — Telehealth: Payer: Self-pay

## 2019-02-10 NOTE — Telephone Encounter (Signed)
Second post procedure follow up call, no answer 

## 2019-02-10 NOTE — Telephone Encounter (Signed)
NO ANSWER, MESSAGE LEFT FOR PATIENT. 

## 2019-02-11 ENCOUNTER — Encounter: Payer: Self-pay | Admitting: Internal Medicine

## 2019-02-23 NOTE — Progress Notes (Signed)
Virtual Visit via Video Note  I connected with patient on 02/24/19 at  3:15 PM EST by audio enabled telemedicine application and verified that I am speaking with the correct person using two identifiers.   THIS ENCOUNTER IS A VIRTUAL VISIT DUE TO COVID-19 - PATIENT WAS NOT SEEN IN THE OFFICE. PATIENT HAS CONSENTED TO VIRTUAL VISIT / TELEMEDICINE VISIT   Location of patient: home  Location of provider: office  I discussed the limitations of evaluation and management by telemedicine and the availability of in person appointments. The patient expressed understanding and agreed to proceed.   Subjective:   Raymand Yenter is a 69 y.o. male who presents for Medicare Annual/Subsequent preventive examination.  Pt works at [employer] about 30 hrs/ wk.   Review of Systems:  Home Safety/Smoke Alarms: Feels safe in home. Smoke alarms in place.  Lives with wife and dog in 1 story home.   Male:   CCS- 02/08/19.  Recall 3 yrs.    PSA-  Lab Results  Component Value Date   PSA 0.04 (L) 12/02/2018   PSA 0.03 (L) 01/13/2018   PSA <0.1 03/01/2016       Objective:    Vitals: Unable to assess. This visit is enabled though telemedicine due to Covid 19.   Advanced Directives 02/24/2019 02/04/2018  Does Patient Have a Medical Advance Directive? Yes Yes  Type of Paramedic of [place];Living will [place];Living will  Does patient want to make changes to medical advance directive? No - Patient declined No - Patient declined  Copy of [place] in Chart? No - copy requested No - copy requested    Tobacco Social History   Tobacco Use  Smoking Status Never Smoker  Smokeless Tobacco Never Used     Counseling given: Not Answered   Clinical Intake: Pain : No/denies pain     Past Medical History:  Diagnosis Date  . Basal cell carcinoma   . History of retinal detachment   . Hyperlipidemia   . Prostate cancer [facility])    Past  Surgical History:  Procedure Laterality Date  . BASAL CELL CARCINOMA EXCISION  2019  . COLONOSCOPY  2013  . PROSTATECTOMY  2004  . REPLACEMENT TOTAL KNEE BILATERAL Bilateral 2009  . RETINAL DETACHMENT SURGERY Right 2015  . RETINAL LASER PROCEDURE  2017  . SIGMOIDOSCOPY  2000   Family History  Problem Relation Age of Onset  . Colon cancer Neg Hx   . Esophageal cancer Neg Hx   . Prostate cancer Neg Hx   . Rectal cancer Neg Hx    Social History   Socioeconomic History  . Marital status: Married    Spouse name: Not on file  . Number of children: Not on file  . Years of education: Not on file  . Highest education level: Not on file  Occupational History  . Not on file  Tobacco Use  . Smoking status: Never Smoker  . Smokeless tobacco: Never Used  Substance and Sexual Activity  . Alcohol use: Yes    Comment: Beer per day  . Drug use: Never  . Sexual activity: Not Currently  Other Topics Concern  . Not on file  Social History Narrative   Moved here from [place], [place] in 05/2015.   Works part time at [employer].   Social Determinants of Health   Financial Resource Strain:   . Difficulty of Paying Living Expenses: Not on file  Food Insecurity:   .  Worried About Charity fundraiser in the Last Year: Not on file  . Ran Out of Food in the Last Year: Not on file  Transportation Needs:   . Lack of Transportation (Medical): Not on file  . Lack of Transportation (Non-Medical): Not on file  Physical Activity:   . Days of Exercise per Week: Not on file  . Minutes of Exercise per Session: Not on file  Stress:   . Feeling of Stress : Not on file  Social Connections:   . Frequency of Communication with Friends and Family: Not on file  . Frequency of Social Gatherings with Friends and Family: Not on file  . Attends Religious Services: Not on file  . Active Member of Clubs or Organizations: Not on file  . Attends Archivist Meetings: Not on file  . Marital Status: Not  on file    Outpatient Encounter Medications as of 02/24/2019  Medication Sig  . brimonidine (ALPHAGAN P) 0.1 % SOLN   . Coenzyme Q10 100 MG capsule Take 100 mg by mouth.   . magnesium 30 MG tablet Take 30 mg by mouth 2 (two) times daily.  [name] MILK THISTLE PO Take by mouth.  . Multiple Vitamin (MULTIVITAMIN) capsule Take by mouth.  . Omega-3 Fatty Acids (FISH OIL) 1000 MG CAPS Take by mouth.   No facility-administered encounter medications on file as of 02/24/2019.    Activities of Daily Living In your present state of health, do you have any difficulty performing the following activities: 02/24/2019  Hearing? Y  Comment yes wears hearing aids  Vision? N  Difficulty concentrating or making decisions? N  Walking or climbing stairs? N  Dressing or bathing? N  Doing errands, shopping? N  Preparing Food and eating ? N  Using the Toilet? N  In the past six months, have you accidently leaked urine? N  Do you have problems with loss of bowel control? N  Managing your Medications? N  Managing your Finances? N  Housekeeping or managing your Housekeeping? N  Some recent data might be hidden    Patient Care Team: [name], MD as PCP - General (Family Medicine)   Assessment:   This is a routine wellness examination for [place]. Physical assessment deferred to PCP.  Exercise Activities and Dietary recommendations Current Exercise Habits: The patient has a physically strenuous job, but has no regular exercise apart from work., Exercise limited by: None identified Diet (meal preparation, eat out, water intake, caffeinated beverages, dairy products, fruits and vegetables): 24 hr recall Breakfast:skipped Lunch: chicken soup  Dinner: pot roast w/ veggies and pasta    Goals    . Maintain healthy active lifestyle.       Fall Risk Fall Risk  02/24/2019 02/04/2018 02/04/2018  Falls in the past year? 0 0 0  Follow up Education provided;Falls prevention discussed Falls evaluation completed -      Depression Screen PHQ 2/9 Scores 02/04/2018  PHQ - 2 Score 0    Cognitive Function Ad8 score reviewed for issues:  Issues making decisions:no  Less interest in hobbies / activities:no  Repeats questions, stories (family complaining):no  Trouble using ordinary gadgets (microwave, computer, phone):no  Forgets the month or year: no  Mismanaging finances: no  Remembering appts:no  Daily problems with thinking and/or memory:no Ad8 score is=0     MMSE - Mini Mental State Exam 02/04/2018  Orientation to time 5  Orientation to Place 5  Registration 3  Attention/ Calculation 5  Recall 2  Language- name 2 objects 2  Language- repeat 1  Language- follow 3 step command 3  Language- read & follow direction 1  Write a sentence 1  Copy design 1  Total score 29        Immunization History  Administered Date(s) Administered  . Fluad Quad(high Dose 65+) 12/02/2018  . Influenza, High Dose Seasonal PF 11/19/2015, 01/13/2018  . Pneumococcal Conjugate-13 03/05/2016  . Pneumococcal Polysaccharide-23 01/13/2018  . Tdap 06/19/2015  . Zoster 02/28/2009     Screening Tests Health Maintenance  Topic Date Due  . COLONOSCOPY  02/07/2022  . TETANUS/TDAP  06/18/2025  . INFLUENZA VACCINE  Completed  . PNA vac Low Risk Adult  Completed  . Hepatitis C Screening  Discontinued   Cancer Screenings:     Plan:    Please schedule your next medicare wellness visit with me in 1 yr.  Continue to eat heart healthy diet (full of fruits, vegetables, whole grains, lean protein, water--limit salt, fat, and sugar intake) and increase physical activity as tolerated.  Continue doing brain stimulating activities (puzzles, reading, adult coloring books, staying active) to keep memory sharp.   Bring a copy of your living will and/or healthcare power of attorney to your next office visit.   I have personally reviewed and noted the following in the patient's chart:   . Medical and social  history . Use of alcohol, tobacco or illicit drugs  . Current medications and supplements . Functional ability and status . Nutritional status . Physical activity . Advanced directives . List of other physicians . Hospitalizations, surgeries, and ER visits in previous 12 months . Vitals . Screenings to include cognitive, depression, and falls . Referrals and appointments  In addition, I have reviewed and discussed with patient certain preventive protocols, quality metrics, and best practice recommendations. A written personalized care plan for preventive services as well as general preventive health recommendations were provided to patient.     Shela Nevin, [place]  02/24/2019

## 2019-02-24 ENCOUNTER — Encounter: Payer: Self-pay | Admitting: *Deleted

## 2019-02-24 ENCOUNTER — Ambulatory Visit (INDEPENDENT_AMBULATORY_CARE_PROVIDER_SITE_OTHER): Payer: Medicare Other | Admitting: *Deleted

## 2019-02-24 DIAGNOSIS — Z0000 Encounter for general adult medical examination without abnormal findings: Secondary | ICD-10-CM

## 2019-02-24 NOTE — Patient Instructions (Signed)
Please schedule your next medicare wellness visit with me in 1 yr.  Continue to eat heart healthy diet (full of fruits, vegetables, whole grains, lean protein, water--limit salt, fat, and sugar intake) and increase physical activity as tolerated.  Continue doing brain stimulating activities (puzzles, reading, adult coloring books, staying active) to keep memory sharp.   Bring a copy of your living will and/or healthcare power of attorney to your next office visit.   Bryan Cowan , Thank you for taking time to come for your Medicare Wellness Visit. I appreciate your ongoing commitment to your health goals. Please review the following plan we discussed and let me know if I can assist you in the future.   These are the goals we discussed: Goals    . Maintain healthy active lifestyle.       This is a list of the screening recommended for you and due dates:  Health Maintenance  Topic Date Due  . Colon Cancer Screening  02/07/2022  . Tetanus Vaccine  06/18/2025  . Flu Shot  Completed  . Pneumonia vaccines  Completed  .  Hepatitis C: One time screening is recommended by Center for Disease Control  (CDC) for  adults born from [birth year] through 1967.   Discontinued    Preventive Care 60 Years and Older, Male Preventive care refers to lifestyle choices and visits with your health care provider that can promote health and wellness. This includes:  A yearly physical exam. This is also called an annual well check.  Regular dental and eye exams.  Immunizations.  Screening for certain conditions.  Healthy lifestyle choices, such as diet and exercise. What can I expect for my preventive care visit? Physical exam Your health care provider will check:  Height and weight. These may be used to calculate body mass index (BMI), which is a measurement that tells if you are at a healthy weight.  Heart rate and blood pressure.  Your skin for abnormal spots. Counseling Your health care provider may  ask you questions about:  Alcohol, tobacco, and drug use.  Emotional well-being.  Home and relationship well-being.  Sexual activity.  Eating habits.  History of falls.  Memory and ability to understand (cognition).  Work and work Statistician. What immunizations do I need?  Influenza (flu) vaccine  This is recommended every year. Tetanus, diphtheria, and pertussis (Tdap) vaccine  You may need a Td booster every 10 years. Varicella (chickenpox) vaccine  You may need this vaccine if you have not already been vaccinated. Zoster (shingles) vaccine  You may need this after age 87. Pneumococcal conjugate (PCV13) vaccine  One dose is recommended after age 77. Pneumococcal polysaccharide (PPSV23) vaccine  One dose is recommended after age 88. Measles, mumps, and rubella (MMR) vaccine  You may need at least one dose of MMR if you were born in 1957 or later. You may also need a second dose. Meningococcal conjugate (MenACWY) vaccine  You may need this if you have certain conditions. Hepatitis A vaccine  You may need this if you have certain conditions or if you travel or work in places where you may be exposed to hepatitis A. Hepatitis B vaccine  You may need this if you have certain conditions or if you travel or work in places where you may be exposed to hepatitis B. Haemophilus influenzae type b (Hib) vaccine  You may need this if you have certain conditions. You may receive vaccines as individual doses or as more than one vaccine together  in one shot (combination vaccines). Talk with your health care provider about the risks and benefits of combination vaccines. What tests do I need? Blood tests  Lipid and cholesterol levels. These may be checked every 5 years, or more frequently depending on your overall health.  Hepatitis C test.  Hepatitis B test. Screening  Lung cancer screening. You may have this screening every year starting at age 64 if you have a  30-pack-year history of smoking and currently smoke or have quit within the past 15 years.  Colorectal cancer screening. All adults should have this screening starting at age 53 and continuing until age 78. Your health care provider may recommend screening at age 71 if you are at increased risk. You will have tests every 1-10 years, depending on your results and the type of screening test.  Prostate cancer screening. Recommendations will vary depending on your family history and other risks.  Diabetes screening. This is done by checking your blood sugar (glucose) after you have not eaten for a while (fasting). You may have this done every 1-3 years.  Abdominal aortic aneurysm (AAA) screening. You may need this if you are a current or former smoker.  Sexually transmitted disease (STD) testing. Follow these instructions at home: Eating and drinking  Eat a diet that includes fresh fruits and vegetables, whole grains, lean protein, and low-fat dairy products. Limit your intake of foods with high amounts of sugar, saturated fats, and salt.  Take vitamin and mineral supplements as recommended by your health care provider.  Do not drink alcohol if your health care provider tells you not to drink.  If you drink alcohol: ? Limit how much you have to 0-2 drinks a day. ? Be aware of how much alcohol is in your drink. In the [place] one drink equals one 12 oz bottle of beer (355 mL), one 5 oz glass of wine (148 mL), or one 1 oz glass of hard liquor (44 mL). Lifestyle  Take daily care of your teeth and gums.  Stay active. Exercise for at least 30 minutes on 5 or more days each week.  Do not use any products that contain nicotine or tobacco, such as cigarettes, e-cigarettes, and chewing tobacco. If you need help quitting, ask your health care provider.  If you are sexually active, practice safe sex. Use a condom or other form of protection to prevent STIs (sexually transmitted infections).  Talk  with your health care provider about taking a low-dose aspirin or statin. What's next?  Visit your health care provider once a year for a well check visit.  Ask your health care provider how often you should have your eyes and teeth checked.  Stay up to date on all vaccines. This information is not intended to replace advice given to you by your health care provider. Make sure you discuss any questions you have with your health care provider. Document Revised: 01/29/2018 Document Reviewed: 01/29/2018 Elsevier Patient Education  2020 [employer].

## 2019-02-25 ENCOUNTER — Ambulatory Visit: Payer: Medicare Other | Attending: Internal Medicine

## 2019-02-25 DIAGNOSIS — Z20822 Contact with and (suspected) exposure to covid-19: Secondary | ICD-10-CM | POA: Diagnosis not present

## 2019-02-27 LAB — NOVEL CORONAVIRUS, NAA: SARS-CoV-2, NAA: NOT DETECTED

## 2019-03-01 DIAGNOSIS — H35372 Puckering of macula, left eye: Secondary | ICD-10-CM | POA: Diagnosis not present

## 2019-03-01 DIAGNOSIS — H401113 Primary open-angle glaucoma, right eye, severe stage: Secondary | ICD-10-CM | POA: Diagnosis not present

## 2019-03-01 DIAGNOSIS — Z961 Presence of intraocular lens: Secondary | ICD-10-CM | POA: Diagnosis not present

## 2019-03-01 DIAGNOSIS — H33001 Unspecified retinal detachment with retinal break, right eye: Secondary | ICD-10-CM | POA: Diagnosis not present

## 2019-03-01 DIAGNOSIS — H40022 Open angle with borderline findings, high risk, left eye: Secondary | ICD-10-CM | POA: Diagnosis not present

## 2019-04-02 DIAGNOSIS — Z23 Encounter for immunization: Secondary | ICD-10-CM | POA: Diagnosis not present

## 2019-04-30 DIAGNOSIS — Z23 Encounter for immunization: Secondary | ICD-10-CM | POA: Diagnosis not present

## 2019-08-02 ENCOUNTER — Encounter (INDEPENDENT_AMBULATORY_CARE_PROVIDER_SITE_OTHER): Payer: Medicare Other | Admitting: Ophthalmology

## 2019-08-13 ENCOUNTER — Telehealth: Payer: Self-pay | Admitting: General Practice

## 2019-08-13 NOTE — Telephone Encounter (Signed)
Patient is a former Dr. Deborra Medina patient. Called to see if he was seeing another PCP or if he wanted to establish care with one of our providers here at the office. Left a message to give the office a call back.

## 2019-08-25 ENCOUNTER — Other Ambulatory Visit: Payer: Self-pay

## 2019-08-26 ENCOUNTER — Ambulatory Visit (INDEPENDENT_AMBULATORY_CARE_PROVIDER_SITE_OTHER): Payer: Medicare Other | Admitting: Nurse Practitioner

## 2019-08-26 ENCOUNTER — Encounter: Payer: Self-pay | Admitting: Nurse Practitioner

## 2019-08-26 VITALS — BP 132/62 | HR 70 | Temp 97.3°F | Ht 73.0 in | Wt 221.4 lb

## 2019-08-26 DIAGNOSIS — R1032 Left lower quadrant pain: Secondary | ICD-10-CM | POA: Diagnosis not present

## 2019-08-26 NOTE — Progress Notes (Signed)
Subjective:  Patient ID: Bryan Cowan, male    DOB: 07/06/1950  Age: 69 y.o. MRN: [id number]  CC: Hernia (pt reports that he has pain in his side that come and goes for some time//states he had a colonoscopy to rule out any hernias but still has symptoms)  Groin Pain The patient's primary symptoms include pelvic pain. The patient's pertinent negatives include no genital injury, genital itching, genital lesions, penile discharge, penile pain, priapism, scrotal swelling or testicular pain. This is a recurrent problem. The current episode started more than 1 year ago. The problem occurs intermittently. The problem has been waxing and waning. The pain is severe. Pertinent negatives include no abdominal pain, chills, diarrhea, fever, flank pain, frequency, hesitancy, joint pain, rash, urgency or urinary retention. Exacerbated by: constipation, standing. He has tried rest for the symptoms. The treatment provided moderate relief. There is no history of BPH, an inguinal hernia or prostatitis.  reports bulging mass during acute episodes of severe pain.  Reports hx of Chronic constipation:  use of magnesium prn with significant relief Last BM this am: normal per patient  Reviewed past Medical, Social and Family history today.  Outpatient Medications Prior to Visit  Medication Sig Dispense Refill  . brimonidine (ALPHAGAN P) 0.1 % SOLN     . brimonidine (ALPHAGAN) 0.2 % ophthalmic solution Place 1 drop into the right eye 2 (two) times daily.    . Coenzyme Q10 100 MG capsule Take 100 mg by mouth.     . magnesium 30 MG tablet Take 30 mg by mouth 2 (two) times daily.    [name] MILK THISTLE PO Take by mouth.    . Multiple Vitamin (MULTIVITAMIN) capsule Take by mouth.    . Omega-3 Fatty Acids (FISH OIL) 1000 MG CAPS Take by mouth.     No facility-administered medications prior to visit.    ROS See HPI  Objective:  BP 132/62   Pulse 70   Temp (!) 97.3 F (36.3 C) (Tympanic)   Ht 6\' 1"  (1.854 m)   Wt  221 lb 6.4 oz (100.4 kg)   SpO2 95%   BMI 29.21 kg/m   Physical Exam Exam conducted with a chaperone present.  Abdominal:     General: Abdomen is flat. Bowel sounds are normal. There is no distension.     Palpations: Abdomen is soft. There is no mass.     Tenderness: There is no abdominal tenderness. There is no right CVA tenderness, left CVA tenderness or guarding.     Hernia: There is no hernia in the left inguinal area or right inguinal area.  Genitourinary:    Penis: Circumcised.      Testes: Normal.    Musculoskeletal:     Right lower leg: No edema.     Left lower leg: No edema.  Lymphadenopathy:     Lower Body: No right inguinal adenopathy. No left inguinal adenopathy.  Skin:    General: Skin is warm and dry.     Findings: No erythema or rash.  Neurological:     Mental Status: He is alert and oriented to person, place, and time.  Psychiatric:        Mood and Affect: Mood normal.        Behavior: Behavior normal.        Thought Content: Thought content normal.     Assessment & Plan:  This visit occurred during the SARS-CoV-2 public health emergency.  Safety protocols were in place, including screening questions prior  to the visit, additional usage of staff PPE, and extensive cleaning of exam room while observing appropriate contact time as indicated for disinfecting solutions.   Carlee was seen today for hernia.  Diagnoses and all orders for this visit:  Left inguinal pain -     US SCROTUM; Future   Problem List Items Addressed This Visit    None    Visit Diagnoses    Left inguinal pain    -  Primary   Relevant Orders   US SCROTUM      Follow-up: Return if symptoms worsen or fail to improve.  Wilfred Lacy, NP

## 2019-08-26 NOTE — Patient Instructions (Signed)
You will be contacted to schedule groin [place] Use magnesium supplement daily Maintain adequate oral hydration and high fiber diet Use famotidine 20mg  BID for 2-7days, then stop.

## 2019-08-26 NOTE — Assessment & Plan Note (Signed)
Chronic, intermittent, worsening intensity. reports bulging mass during acute episodes of severe pain. Reviewed previous CT ABD/pelvis and colonoscopy.  Pelvic US ordered to rule out femoral hernia

## 2019-09-02 ENCOUNTER — Ambulatory Visit: Payer: Medicare Other | Admitting: Family Medicine

## 2019-09-03 ENCOUNTER — Ambulatory Visit
Admission: RE | Admit: 2019-09-03 | Discharge: 2019-09-03 | Disposition: A | Discharge status: Home / Self Care | Payer: Medicare Other | Source: Ambulatory Visit | Attending: Nurse Practitioner | Admitting: Nurse Practitioner

## 2019-09-03 DIAGNOSIS — I861 Scrotal varices: Secondary | ICD-10-CM | POA: Diagnosis not present

## 2019-09-03 DIAGNOSIS — N433 Hydrocele, unspecified: Secondary | ICD-10-CM | POA: Diagnosis not present

## 2019-09-03 DIAGNOSIS — R1032 Left lower quadrant pain: Secondary | ICD-10-CM

## 2019-09-03 DIAGNOSIS — N503 Cyst of epididymis: Secondary | ICD-10-CM | POA: Diagnosis not present

## 2019-09-04 ENCOUNTER — Encounter: Payer: Self-pay | Admitting: Nurse Practitioner

## 2019-09-04 DIAGNOSIS — R1032 Left lower quadrant pain: Secondary | ICD-10-CM

## 2019-09-04 DIAGNOSIS — D71 Functional disorders of polymorphonuclear neutrophils: Secondary | ICD-10-CM

## 2019-09-06 DIAGNOSIS — H401113 Primary open-angle glaucoma, right eye, severe stage: Secondary | ICD-10-CM | POA: Diagnosis not present

## 2019-09-06 DIAGNOSIS — H33001 Unspecified retinal detachment with retinal break, right eye: Secondary | ICD-10-CM | POA: Diagnosis not present

## 2019-09-06 DIAGNOSIS — H40022 Open angle with borderline findings, high risk, left eye: Secondary | ICD-10-CM | POA: Diagnosis not present

## 2019-09-06 DIAGNOSIS — H35372 Puckering of macula, left eye: Secondary | ICD-10-CM | POA: Diagnosis not present

## 2019-09-06 DIAGNOSIS — Z961 Presence of intraocular lens: Secondary | ICD-10-CM | POA: Diagnosis not present

## 2019-09-07 ENCOUNTER — Other Ambulatory Visit: Payer: Self-pay | Admitting: Nurse Practitioner

## 2019-09-07 DIAGNOSIS — R1032 Left lower quadrant pain: Secondary | ICD-10-CM

## 2019-09-07 DIAGNOSIS — N5089 Other specified disorders of the male genital organs: Secondary | ICD-10-CM

## 2019-09-07 DIAGNOSIS — Z8546 Personal history of malignant neoplasm of prostate: Secondary | ICD-10-CM

## 2019-09-07 NOTE — Progress Notes (Signed)
o obvious hernia. Order CT pelvis for additional eval. Will need updated BMP lab results prior to contrast administration. Orders entered Incidental findings: hydrocele(fluid around testes) and varicocele (dilated sperm cord). In the absence of any testicular pain, no additional evaluations is recommended. Testicular microlithiasis (calcifications) also found. I recommend additional eval by urology. Entered urgent referral

## 2019-09-08 ENCOUNTER — Other Ambulatory Visit: Payer: Self-pay

## 2019-09-09 ENCOUNTER — Ambulatory Visit
Admission: RE | Admit: 2019-09-09 | Discharge: 2019-09-09 | Disposition: A | Discharge status: Home / Self Care | Payer: Medicare Other | Source: Ambulatory Visit | Attending: Nurse Practitioner | Admitting: Nurse Practitioner

## 2019-09-09 ENCOUNTER — Other Ambulatory Visit (INDEPENDENT_AMBULATORY_CARE_PROVIDER_SITE_OTHER): Payer: Medicare Other

## 2019-09-09 DIAGNOSIS — R1032 Left lower quadrant pain: Secondary | ICD-10-CM

## 2019-09-09 DIAGNOSIS — D7389 Other diseases of spleen: Secondary | ICD-10-CM | POA: Diagnosis not present

## 2019-09-09 LAB — BASIC METABOLIC PANEL
BUN: 14 mg/dL (ref 6–23)
CO2: 28 mEq/L (ref 19–32)
Calcium: 9.9 mg/dL (ref 8.4–10.5)
Chloride: 103 mEq/L (ref 96–112)
Creatinine, Ser: 0.72 mg/dL (ref 0.40–1.50)
GFR: 107.67 mL/min (ref >60.00)
Glucose, Bld: 101 mg/dL — ABNORMAL HIGH (ref 70–99)
Potassium: 4.4 mEq/L (ref 3.5–5.1)
Sodium: 137 mEq/L (ref 135–145)

## 2019-09-09 MED ORDER — IOPAMIDOL (ISOVUE-300) INJECTION 61%
100.0000 mL | Freq: Once | INTRAVENOUS | Status: AC | PRN
  Administered 2019-09-09: 100 mL via INTRAVENOUS

## 2019-09-09 NOTE — Addendum Note (Signed)
Addended by: Lynnea Ferrier on: 09/09/2019 09:07 AM   Modules accepted: Orders

## 2019-09-10 NOTE — Addendum Note (Signed)
Addended by: Wilfred Lacy L on: 09/10/2019 02:19 PM   Modules accepted: Orders

## 2019-09-13 ENCOUNTER — Ambulatory Visit (INDEPENDENT_AMBULATORY_CARE_PROVIDER_SITE_OTHER): Payer: Medicare Other | Admitting: Ophthalmology

## 2019-09-13 ENCOUNTER — Other Ambulatory Visit: Payer: Self-pay

## 2019-09-13 ENCOUNTER — Encounter (INDEPENDENT_AMBULATORY_CARE_PROVIDER_SITE_OTHER): Payer: Self-pay | Admitting: Ophthalmology

## 2019-09-13 DIAGNOSIS — H4720 Unspecified optic atrophy: Secondary | ICD-10-CM

## 2019-09-13 DIAGNOSIS — H401112 Primary open-angle glaucoma, right eye, moderate stage: Secondary | ICD-10-CM | POA: Diagnosis not present

## 2019-09-13 DIAGNOSIS — H35412 Lattice degeneration of retina, left eye: Secondary | ICD-10-CM | POA: Diagnosis not present

## 2019-09-13 DIAGNOSIS — Z8546 Personal history of malignant neoplasm of prostate: Secondary | ICD-10-CM | POA: Diagnosis not present

## 2019-09-13 DIAGNOSIS — H35372 Puckering of macula, left eye: Secondary | ICD-10-CM

## 2019-09-13 DIAGNOSIS — H401113 Primary open-angle glaucoma, right eye, severe stage: Secondary | ICD-10-CM | POA: Insufficient documentation

## 2019-09-13 DIAGNOSIS — R102 Pelvic and perineal pain: Secondary | ICD-10-CM | POA: Diagnosis not present

## 2019-09-13 NOTE — Assessment & Plan Note (Signed)
Early under the care of Dr. [name], will follow up as scheduled for possible SLT procedure,  this might also include OS.

## 2019-09-13 NOTE — Progress Notes (Signed)
09/13/2019     CHIEF COMPLAINT Patient presents for Retina Follow Up   HISTORY OF PRESENT ILLNESS: Bryan Cowan is a 69 y.o. male who presents to the clinic today for:   HPI    Retina Follow Up    Diagnosis: Lattice Degeneration.  In left eye.  Severity is moderate.  Duration of 1 year.  Since onset it is stable.  I, the attending physician,  performed the HPI with the patient and updated documentation appropriately.          Comments    1 Year f\u OU.    Pt states vision has slightly decreased. Pt saw Dr. [name] last week and will be having a   SLT glaucoma procedure. Pt is using gtts BID OU as directed.       Last edited by [name], MD on 09/13/2019  9:28 AM. (History)      Referring physician: [name], MD No address on file  HISTORICAL INFORMATION:   Selected notes from the MEDICAL RECORD NUMBER       CURRENT MEDICATIONS: Current Outpatient Medications (Ophthalmic Drugs)  Medication Sig  . brimonidine (ALPHAGAN P) 0.1 % SOLN   . brimonidine (ALPHAGAN) 0.2 % ophthalmic solution Place 1 drop into the right eye 2 (two) times daily.   No current facility-administered medications for this visit. (Ophthalmic Drugs)   Current Outpatient Medications (Other)  Medication Sig  . Coenzyme Q10 100 MG capsule Take 100 mg by mouth.   . magnesium 30 MG tablet Take 30 mg by mouth 2 (two) times daily.  [name] MILK THISTLE PO Take by mouth.  . Multiple Vitamin (MULTIVITAMIN) capsule Take by mouth.  . Omega-3 Fatty Acids (FISH OIL) 1000 MG CAPS Take by mouth.   No current facility-administered medications for this visit. (Other)      REVIEW OF SYSTEMS:    ALLERGIES No Known Allergies  PAST MEDICAL HISTORY Past Medical History:  Diagnosis Date  . Basal cell carcinoma   . History of retinal detachment   . Hyperlipidemia   . Prostate cancer [facility])    Past Surgical History:  Procedure Laterality Date  . BASAL CELL CARCINOMA EXCISION  2019  . COLONOSCOPY   2013  . PROSTATECTOMY  2004  . REPLACEMENT TOTAL KNEE BILATERAL Bilateral 2009  . RETINAL DETACHMENT SURGERY Right 2015  . RETINAL LASER PROCEDURE  2017  . SIGMOIDOSCOPY  2000    FAMILY HISTORY Family History  Problem Relation Age of Onset  . Colon cancer Neg Hx   . Esophageal cancer Neg Hx   . Prostate cancer Neg Hx   . Rectal cancer Neg Hx     SOCIAL HISTORY Social History   Tobacco Use  . Smoking status: Never Smoker  . Smokeless tobacco: Never Used  Vaping Use  . Vaping Use: Never used  Substance Use Topics  . Alcohol use: Yes    Comment: Beer per day  . Drug use: Never         OPHTHALMIC EXAM:  Base Eye Exam    Visual Acuity (Snellen - Linear)      Right Left   Dist cc CF @ 1' 20/20   Dist ph cc NI        Tonometry (Tonopen, 8:48 AM)      Right Left   Pressure 17 17       Pupils      Pupils Dark Light Shape React APD   Right PERRL 3 2  Round Brisk +1   Left PERRL 3 2 Round Brisk None       Visual Fields (Counting fingers)      Left Right    Full Full       Neuro/Psych    Oriented x3: Yes   Mood/Affect: Normal       Dilation    Both eyes: 1.0% Mydriacyl, 2.5% Phenylephrine @ 8:48 AM        Slit Lamp and Fundus Exam    External Exam      Right Left   External Normal Normal       Slit Lamp Exam      Right Left   Lids/Lashes Normal Normal   Conjunctiva/Sclera White and quiet White and quiet   Cornea Clear Clear   Anterior Chamber Deep and quiet Deep and quiet   Iris Round and reactive Round and reactive   Lens Centered posterior chamber intraocular lens Centered posterior chamber intraocular lens   Anterior Vitreous Normal Normal       Fundus Exam      Right Left   Posterior Vitreous Clear Posterior vitreous detachment   Disc 4+ Optic disc atrophy, 4+ Pallor Normal   C/D Ratio 0.95 0.55   Macula Normal Epiretinal membrane mild topographic distortion   Vessels Normal Normal   Periphery Scleral buckle nearly 360, and 8/10. Is  degeneration inferotemporal and supranasal, good laser photocoagulation from previous holes.          IMAGING AND PROCEDURES  Imaging and Procedures for 09/13/19  OCT, Retina - OU - Both Eyes       Right Eye Scan locations included subfoveal. Central Foveal Thickness: 208. Findings include abnormal foveal contour, outer retinal atrophy, central retinal atrophy.   Left Eye Quality was good. Scan locations included subfoveal. Central Foveal Thickness: 323. Findings include normal foveal contour, epiretinal membrane.   Notes OS, nondistorting epiretinal membrane                ASSESSMENT/PLAN:  Primary open angle glaucoma of right eye, severe stage Early under the care of Dr. [name], will follow up as scheduled for possible SLT procedure,  this might also include OS.      ICD-10-CM   1. Left retinal lattice degeneration  H35.412 OCT, Retina - OU - Both Eyes  2. Primary open angle glaucoma of right eye, moderate stage  H40.1112   3. Optic disc atrophy, right  H47.20   4. Macular pucker, left eye  H35.372     1.  2.  3.  Ophthalmic Meds Ordered this visit:  No orders of the defined types were placed in this encounter.      Return in about 1 year (around 09/12/2020) for DILATE OU, COLOR FP, OCT.  There are no Patient Instructions on file for this visit.   Explained the diagnoses, plan, and follow up with the patient and they expressed understanding.  Patient expressed understanding of the importance of proper follow up care.   [name] M.D. Diseases & Surgery of the Retina and Vitreous Retina & Diabetic [place] 09/13/19     Abbreviations: M myopia (nearsighted); A astigmatism; H hyperopia (farsighted); P presbyopia; Mrx spectacle prescription;  CTL contact lenses; OD right eye; OS left eye; OU both eyes  XT exotropia; ET esotropia; PEK punctate epithelial keratitis; PEE punctate epithelial erosions; DES dry eye syndrome; MGD meibomian  gland dysfunction; ATs artificial tears; PFAT's preservative free artificial tears; [place] nuclear sclerotic cataract; [place]  posterior subcapsular cataract; ERM epi-retinal membrane; PVD posterior vitreous detachment; RD retinal detachment; DM diabetes mellitus; DR diabetic retinopathy; NPDR non-proliferative diabetic retinopathy; PDR proliferative diabetic retinopathy; CSME clinically significant macular edema; DME diabetic macular edema; dbh dot blot hemorrhages; CWS cotton wool spot; POAG primary open angle glaucoma; C/D cup-to-disc ratio; HVF humphrey visual field; GVF goldmann visual field; OCT optical coherence tomography; IOP intraocular pressure; BRVO Branch retinal vein occlusion; CRVO central retinal vein occlusion; CRAO central retinal artery occlusion; BRAO branch retinal artery occlusion; RT retinal tear; SB scleral buckle; PPV pars plana vitrectomy; VH Vitreous hemorrhage; PRP panretinal laser photocoagulation; IVK intravitreal kenalog; VMT vitreomacular traction; MH Macular hole;  NVD neovascularization of the disc; NVE neovascularization elsewhere; AREDS age related eye disease study; ARMD age related macular degeneration; POAG primary open angle glaucoma; EBMD epithelial/anterior basement membrane dystrophy; ACIOL anterior chamber intraocular lens; IOL intraocular lens; PCIOL posterior chamber intraocular lens; Phaco/IOL phacoemulsification with intraocular lens placement; Blue photorefractive keratectomy; LASIK laser assisted in situ keratomileusis; HTN hypertension; DM diabetes mellitus; COPD chronic obstructive pulmonary disease

## 2019-09-14 NOTE — Addendum Note (Signed)
Addended by: Leana Gamer on: 09/14/2019 05:52 PM   Modules accepted: Orders

## 2019-09-16 ENCOUNTER — Other Ambulatory Visit: Payer: Self-pay | Admitting: Nurse Practitioner

## 2019-09-16 DIAGNOSIS — D71 Functional disorders of polymorphonuclear neutrophils: Secondary | ICD-10-CM

## 2019-09-16 DIAGNOSIS — K753 Granulomatous hepatitis, not elsewhere classified: Secondary | ICD-10-CM | POA: Insufficient documentation

## 2019-09-16 NOTE — Progress Notes (Signed)
Referral to ID entered based on hematologist recommendation

## 2019-10-11 ENCOUNTER — Ambulatory Visit: Payer: Self-pay | Admitting: Surgery

## 2019-10-11 DIAGNOSIS — K4090 Unilateral inguinal hernia, without obstruction or gangrene, not specified as recurrent: Secondary | ICD-10-CM

## 2019-10-11 NOTE — H&P (View-Only) (Signed)
Bryan [name]: 10/11/2019 10:10 AM Location: [facility] Patient #: [id number] DOB: 1950-08-12 Single / Language: [name] / Race: White Male  History of Present Illness Bryan Cowan [name] MD; 10/11/2019 1:06 PM) Patient words: Patient returns for follow-up of left lower abdominal pain. He was evaluated here and go down to have constipation. He is taken magnesium citrate this seems to help his bowel function. He's developed a bulge in his left groin and left groin pain. Workup included an examination as well as a computed tomography scan and ultrasound. Nothing specific except bilateral varicoceles was noted on the ultrasound and a computed tomography scan was read consistent with constipation. He develops a bulge that pops in and pops out over his left groin. His wife is taking a photograph of this this appears to be a left inguinal hernia. He is not having very much pain. He does have some soreness in his left groin and his constipation is much better than it was.  The patient is a 69 year old male.   Past Surgical History [name], [place]; 10/11/2019 10:16 AM) Cataract Surgery Bilateral. Colon Polyp Removal - Colonoscopy Knee Surgery Bilateral. Oral Surgery Prostate Surgery - Removal  Diagnostic Studies History [name], RMA; 10/11/2019 10:16 AM) Colonoscopy within last year 5-10 years ago  Allergies [name], RMA; 10/11/2019 10:23 AM) No Known Drug Allergies [12/15/2018]: Allergies Reconciled  Medication History [name], [place]; 10/11/2019 10:25 AM) Brimonidine Tartrate (0.15% Solution, Ophthalmic) Active. Magnesium (30MG  Tablet, Oral) Active. Multivitamin Adult (Oral) Active. Omega 3 (Oral) Specific strength unknown - Active. Medications Reconciled  Social History [name], [place]; 10/11/2019 10:16 AM) Alcohol use Moderate alcohol use. Caffeine use Coffee. No drug use Tobacco use Never smoker.  Family History  [name], [place]; 10/11/2019 10:16 AM) Cancer Father, Mother. Heart Disease Mother. Respiratory Condition Father, Mother.  Other Problems [name], RMA; 10/11/2019 10:16 AM) Back Pain Gastroesophageal Reflux Disease Other disease, cancer, significant illness Prostate Cancer     Review of Systems [name] RMA; 10/11/2019 10:16 AM) General Not Present- Appetite Loss, Chills, Fatigue, Fever, Night Sweats, Weight Gain and Weight Loss. HEENT Present- Hearing Loss. Not Present- Earache, Hoarseness, Nose Bleed, Oral Ulcers, Ringing in the Ears, Seasonal Allergies, Sinus Pain, Sore Throat, Visual Disturbances, Wears glasses/contact lenses and Yellow Eyes. Gastrointestinal Present- Bloating and Indigestion. Not Present- Abdominal Pain, Bloody Stool, Change in Bowel Habits, Chronic diarrhea, Constipation, Difficulty Swallowing, Excessive gas, Gets full quickly at meals, Hemorrhoids, Nausea, Rectal Pain and Vomiting. Male Genitourinary Present- Impotence. Not Present- Blood in Urine, Change in Urinary Stream, Frequency, Nocturia, Painful Urination, Urgency and Urine Leakage. Neurological Not Present- Decreased Memory, Fainting, Headaches, Numbness, Seizures, Tingling, Tremor, Trouble walking and Weakness.  Vitals [name] RMA; 10/11/2019 10:25 AM) 10/11/2019 10:25 AM Weight: 223.38 lb Height: 72in Body Surface Area: 2.23 m Body Mass Index: 30.29 kg/m  Temp.: 98.50F  Pulse: 87 (Regular)  P.OX: 97% (Room air) BP: 136/80(Sitting, Left Arm, Standard)        Physical Exam ([name] A. [name] MD; 10/11/2019 1:07 PM)  General Mental Status-Alert. General Appearance-Consistent with stated age. Hydration-Well hydrated. Voice-Normal.  Chest and Lung Exam Chest and lung exam reveals -quiet, even and easy respiratory effort with no use of accessory muscles and on auscultation, normal breath sounds, no adventitious sounds and normal vocal  resonance. Inspection Chest Wall - Normal. Back - normal.  Cardiovascular Cardiovascular examination reveals -normal heart sounds, regular rate and rhythm with no murmurs and normal pedal pulses bilaterally.  Abdomen Note: Soft nontender. Small left inguinal hernia noted which is reducible. No evidence of right inguinal hernia. Penis, testes and scrotum normal  Neurologic Neurologic evaluation reveals -alert and oriented x 3 with no impairment of recent or remote memory. Mental Status-Normal.    Assessment & Plan ([name] A. [name] MD; 10/11/2019 1:03 PM)  LEFT INGUINAL HERNIA (K40.90) Impression: Reducible Recommend repair with mesh since this is the only surgical option available. Observation is an option if he wishes to follow this. Frozen cons of each were discussed. Complications and long-term expectations of each discussed. He is opted for repair of his left inguinal hernia with mesh.  Current Plans Pt Education - Pamphlet Given - Hernia Surgery: discussed with patient and provided information. You are being scheduled for surgery- Our schedulers will call you.  You should hear from our office's scheduling department within 5 working days about the location, date, and time of surgery. We try to make accommodations for patient's preferences in scheduling surgery, but sometimes the OR schedule or the surgeon's schedule prevents [place] from making those accommodations.  If you have not heard from our office [phone]) in 5 working days, call the office and ask for your surgeon's nurse.  If you have other questions about your diagnosis, plan, or surgery, call the office and ask for your surgeon's nurse.  The anatomy & physiology of the abdominal wall and pelvic floor was discussed. The pathophysiology of hernias in the inguinal and pelvic region was discussed. Natural history risks such as progressive enlargement, pain, incarceration, and strangulation was discussed.  Contributors to complications such as smoking, obesity, diabetes, prior surgery, etc were discussed.  I feel the risks of no intervention will lead to serious problems that outweigh the operative risks; therefore, I recommended surgery to reduce and repair the hernia. I explained need for an open approach. I noted usual use of mesh to patch and/or buttress hernia repair  Risks such as bleeding, infection, abscess, need for further treatment, heart attack, death, and other risks were discussed. I noted a good likelihood this will help address the problem. Goals of post-operative recovery were discussed as well. Possibility that this will not correct all symptoms was explained. I stressed the importance of low-impact activity, aggressive pain control, avoiding constipation, & not pushing through pain to minimize risk of post-operative chronic pain or injury. Possibility of reherniation was discussed. We will work to minimize complications.  An educational handout further explaining the pathology & treatment options was given as well. Questions were answered. The patient expresses understanding & wishes to proceed with surgery.  Pt Education - CCS Mesh education: discussed with patient and provided information. Pt Education - Consent for inguinal hernia - [place]: discussed with patient and provided information.

## 2019-10-11 NOTE — H&P (Signed)
Bryan [name]: 10/11/2019 10:10 AM Location: [facility] Patient #: [id number] DOB: 1950/11/22 Single / Language: Bryan Cowan / Race: White Male  History of Present Illness Bryan Moores A. [name] MD; 10/11/2019 1:06 PM) Patient words: Patient returns for follow-up of left lower abdominal pain. He was evaluated here and go down to have constipation. He is taken magnesium citrate this seems to help his bowel function. He's developed a bulge in his left groin and left groin pain. Workup included an examination as well as a computed tomography scan and ultrasound. Nothing specific except bilateral varicoceles was noted on the ultrasound and a computed tomography scan was read consistent with constipation. He develops a bulge that pops in and pops out over his left groin. His wife is taking a photograph of this this appears to be a left inguinal hernia. He is not having very much pain. He does have some soreness in his left groin and his constipation is much better than it was.  The patient is a 69 year old male.   Past Surgical History [name], [place]; 10/11/2019 10:16 AM) Cataract Surgery Bilateral. Colon Polyp Removal - Colonoscopy Knee Surgery Bilateral. Oral Surgery Prostate Surgery - Removal  Diagnostic Studies History [name], RMA; 10/11/2019 10:16 AM) Colonoscopy within last year 5-10 years ago  Allergies [name], RMA; 10/11/2019 10:23 AM) No Known Drug Allergies [12/15/2018]: Allergies Reconciled  Medication History [name], [place]; 10/11/2019 10:25 AM) Brimonidine Tartrate (0.15% Solution, Ophthalmic) Active. Magnesium (30MG  Tablet, Oral) Active. Multivitamin Adult (Oral) Active. Omega 3 (Oral) Specific strength unknown - Active. Medications Reconciled  Social History [name], [place]; 10/11/2019 10:16 AM) Alcohol use Moderate alcohol use. Caffeine use Coffee. No drug use Tobacco use Never smoker.  Family History  [name], [place]; 10/11/2019 10:16 AM) Cancer Father, Mother. Heart Disease Mother. Respiratory Condition Father, Mother.  Other Problems [name], RMA; 10/11/2019 10:16 AM) Back Pain Gastroesophageal Reflux Disease Other disease, cancer, significant illness Prostate Cancer     Review of Systems [name] RMA; 10/11/2019 10:16 AM) General Not Present- Appetite Loss, Chills, Fatigue, Fever, Night Sweats, Weight Gain and Weight Loss. HEENT Present- Hearing Loss. Not Present- Earache, Hoarseness, Nose Bleed, Oral Ulcers, Ringing in the Ears, Seasonal Allergies, Sinus Pain, Sore Throat, Visual Disturbances, Wears glasses/contact lenses and Yellow Eyes. Gastrointestinal Present- Bloating and Indigestion. Not Present- Abdominal Pain, Bloody Stool, Change in Bowel Habits, Chronic diarrhea, Constipation, Difficulty Swallowing, Excessive gas, Gets full quickly at meals, Hemorrhoids, Nausea, Rectal Pain and Vomiting. Male Genitourinary Present- Impotence. Not Present- Blood in Urine, Change in Urinary Stream, Frequency, Nocturia, Painful Urination, Urgency and Urine Leakage. Neurological Not Present- Decreased Memory, Fainting, Headaches, Numbness, Seizures, Tingling, Tremor, Trouble walking and Weakness.  Vitals [name] RMA; 10/11/2019 10:25 AM) 10/11/2019 10:25 AM Weight: 223.38 lb Height: 72in Body Surface Area: 2.23 m Body Mass Index: 30.29 kg/m  Temp.: 98.35F  Pulse: 87 (Regular)  P.OX: 97% (Room air) BP: 136/80(Sitting, Left Arm, Standard)        Physical Exam ([name] A. Jaydy Fitzhenry MD; 10/11/2019 1:07 PM)  General Mental Status-Alert. General Appearance-Consistent with stated age. Hydration-Well hydrated. Voice-Normal.  Chest and Lung Exam Chest and lung exam reveals -quiet, even and easy respiratory effort with no use of accessory muscles and on auscultation, normal breath sounds, no adventitious sounds and normal vocal  resonance. Inspection Chest Wall - Normal. Back - normal.  Cardiovascular Cardiovascular examination reveals -normal heart sounds, regular rate and rhythm with no murmurs and normal pedal pulses bilaterally.  Abdomen Note: Soft nontender. Small left inguinal hernia noted which is reducible. No evidence of right inguinal hernia. Penis, testes and scrotum normal  Neurologic Neurologic evaluation reveals -alert and oriented x 3 with no impairment of recent or remote memory. Mental Status-Normal.    Assessment & Plan (Laconya Clere A. Edoardo Laforte MD; 10/11/2019 1:03 PM)  LEFT INGUINAL HERNIA (K40.90) Impression: Reducible Recommend repair with mesh since this is the only surgical option available. Observation is an option if he wishes to follow this. Frozen cons of each were discussed. Complications and long-term expectations of each discussed. He is opted for repair of his left inguinal hernia with mesh.  Current Plans Pt Education - Pamphlet Given - Hernia Surgery: discussed with patient and provided information. You are being scheduled for surgery- Our schedulers will call you.  You should hear from our office's scheduling department within 5 working days about the location, date, and time of surgery. We try to make accommodations for patient's preferences in scheduling surgery, but sometimes the OR schedule or the surgeon's schedule prevents [place] from making those accommodations.  If you have not heard from our office [phone]) in 5 working days, call the office and ask for your surgeon's nurse.  If you have other questions about your diagnosis, plan, or surgery, call the office and ask for your surgeon's nurse.  The anatomy & physiology of the abdominal wall and pelvic floor was discussed. The pathophysiology of hernias in the inguinal and pelvic region was discussed. Natural history risks such as progressive enlargement, pain, incarceration, and strangulation was discussed.  Contributors to complications such as smoking, obesity, diabetes, prior surgery, etc were discussed.  I feel the risks of no intervention will lead to serious problems that outweigh the operative risks; therefore, I recommended surgery to reduce and repair the hernia. I explained need for an open approach. I noted usual use of mesh to patch and/or buttress hernia repair  Risks such as bleeding, infection, abscess, need for further treatment, heart attack, death, and other risks were discussed. I noted a good likelihood this will help address the problem. Goals of post-operative recovery were discussed as well. Possibility that this will not correct all symptoms was explained. I stressed the importance of low-impact activity, aggressive pain control, avoiding constipation, & not pushing through pain to minimize risk of post-operative chronic pain or injury. Possibility of reherniation was discussed. We will work to minimize complications.  An educational handout further explaining the pathology & treatment options was given as well. Questions were answered. The patient expresses understanding & wishes to proceed with surgery.  Pt Education - CCS Mesh education: discussed with patient and provided information. Pt Education - Consent for inguinal hernia - [place]inger: discussed with patient and provided information.

## 2019-10-18 ENCOUNTER — Ambulatory Visit: Payer: Medicare Other | Admitting: Internal Medicine

## 2019-10-18 DIAGNOSIS — H401113 Primary open-angle glaucoma, right eye, severe stage: Secondary | ICD-10-CM | POA: Diagnosis not present

## 2019-10-29 ENCOUNTER — Other Ambulatory Visit: Payer: Self-pay

## 2019-10-29 ENCOUNTER — Encounter (HOSPITAL_BASED_OUTPATIENT_CLINIC_OR_DEPARTMENT_OTHER): Payer: Self-pay | Admitting: Surgery

## 2019-11-01 ENCOUNTER — Other Ambulatory Visit (HOSPITAL_COMMUNITY)
Admission: RE | Admit: 2019-11-01 | Discharge: 2019-11-01 | Disposition: A | Discharge status: Home / Self Care | Payer: Medicare Other | Source: Ambulatory Visit | Attending: Surgery | Admitting: Surgery

## 2019-11-01 ENCOUNTER — Encounter (HOSPITAL_BASED_OUTPATIENT_CLINIC_OR_DEPARTMENT_OTHER)
Admission: RE | Admit: 2019-11-01 | Discharge: 2019-11-01 | Disposition: A | Discharge status: Home / Self Care | Payer: Medicare Other | Source: Ambulatory Visit | Attending: Surgery | Admitting: Surgery

## 2019-11-01 DIAGNOSIS — Z20822 Contact with and (suspected) exposure to covid-19: Secondary | ICD-10-CM | POA: Insufficient documentation

## 2019-11-01 DIAGNOSIS — Z01812 Encounter for preprocedural laboratory examination: Secondary | ICD-10-CM | POA: Insufficient documentation

## 2019-11-01 DIAGNOSIS — K4090 Unilateral inguinal hernia, without obstruction or gangrene, not specified as recurrent: Secondary | ICD-10-CM | POA: Insufficient documentation

## 2019-11-01 LAB — COMPREHENSIVE METABOLIC PANEL
ALT: 19 U/L (ref 0–44)
AST: 29 U/L (ref 15–41)
Albumin: 3.7 g/dL (ref 3.5–5.0)
Alkaline Phosphatase: 51 U/L (ref 38–126)
Anion gap: 8 (ref 5–15)
BUN: 10 mg/dL (ref 8–23)
CO2: 25 mmol/L (ref 22–32)
Calcium: 8.9 mg/dL (ref 8.9–10.3)
Chloride: 105 mmol/L (ref 98–111)
Creatinine, Ser: 0.75 mg/dL (ref 0.61–1.24)
GFR calc Af Amer: >60 mL/min (ref >60)
GFR calc non Af Amer: >60 mL/min (ref >60)
Glucose, Bld: 102 mg/dL — ABNORMAL HIGH (ref 70–99)
Potassium: 4.4 mmol/L (ref 3.5–5.1)
Sodium: 138 mmol/L (ref 135–145)
Total Bilirubin: 0.6 mg/dL (ref 0.3–1.2)
Total Protein: 6.4 g/dL — ABNORMAL LOW (ref 6.5–8.1)

## 2019-11-01 LAB — CBC WITH DIFFERENTIAL/PLATELET
Abs Immature Granulocytes: 0.01 10*3/uL (ref 0.00–0.07)
Basophils Absolute: 0.1 10*3/uL (ref 0.0–0.1)
Basophils Relative: 1 %
Eosinophils Absolute: 0.5 10*3/uL (ref 0.0–0.5)
Eosinophils Relative: 8 %
HCT: 43.5 % (ref 39.0–52.0)
Hemoglobin: 13.8 g/dL (ref 13.0–17.0)
Immature Granulocytes: 0 %
Lymphocytes Relative: 34 %
Lymphs Abs: 2 10*3/uL (ref 0.7–4.0)
MCH: 26.6 pg (ref 26.0–34.0)
MCHC: 31.7 g/dL (ref 30.0–36.0)
MCV: 83.8 fL (ref 80.0–100.0)
Monocytes Absolute: 0.6 10*3/uL (ref 0.1–1.0)
Monocytes Relative: 10 %
Neutro Abs: 2.7 10*3/uL (ref 1.7–7.7)
Neutrophils Relative %: 47 %
Platelets: 216 10*3/uL (ref 150–400)
RBC: 5.19 MIL/uL (ref 4.22–5.81)
RDW: 13.5 % (ref 11.5–15.5)
WBC: 6 10*3/uL (ref 4.0–10.5)
nRBC: 0 % (ref 0.0–0.2)

## 2019-11-01 LAB — SARS CORONAVIRUS 2 (TAT 6-24 HRS): SARS Coronavirus 2: NEGATIVE

## 2019-11-01 NOTE — Progress Notes (Signed)

## 2019-11-04 ENCOUNTER — Encounter (HOSPITAL_BASED_OUTPATIENT_CLINIC_OR_DEPARTMENT_OTHER)
Admission: RE | Disposition: A | Discharge status: Home / Self Care | Payer: Self-pay | Source: Home / Self Care | Attending: Surgery

## 2019-11-04 ENCOUNTER — Encounter (HOSPITAL_BASED_OUTPATIENT_CLINIC_OR_DEPARTMENT_OTHER): Payer: Self-pay | Admitting: Surgery

## 2019-11-04 ENCOUNTER — Ambulatory Visit (HOSPITAL_BASED_OUTPATIENT_CLINIC_OR_DEPARTMENT_OTHER): Payer: Medicare Other | Admitting: Certified Registered"

## 2019-11-04 ENCOUNTER — Other Ambulatory Visit: Payer: Self-pay

## 2019-11-04 ENCOUNTER — Ambulatory Visit (HOSPITAL_BASED_OUTPATIENT_CLINIC_OR_DEPARTMENT_OTHER)
Admission: RE | Admit: 2019-11-04 | Discharge: 2019-11-04 | Disposition: A | Discharge status: Home / Self Care | Payer: Medicare Other | Attending: Surgery | Admitting: Surgery

## 2019-11-04 DIAGNOSIS — Z79899 Other long term (current) drug therapy: Secondary | ICD-10-CM | POA: Insufficient documentation

## 2019-11-04 DIAGNOSIS — K219 Gastro-esophageal reflux disease without esophagitis: Secondary | ICD-10-CM | POA: Diagnosis not present

## 2019-11-04 DIAGNOSIS — C61 Malignant neoplasm of prostate: Secondary | ICD-10-CM | POA: Diagnosis not present

## 2019-11-04 DIAGNOSIS — Z809 Family history of malignant neoplasm, unspecified: Secondary | ICD-10-CM | POA: Diagnosis not present

## 2019-11-04 DIAGNOSIS — Z8249 Family history of ischemic heart disease and other diseases of the circulatory system: Secondary | ICD-10-CM | POA: Diagnosis not present

## 2019-11-04 DIAGNOSIS — E785 Hyperlipidemia, unspecified: Secondary | ICD-10-CM | POA: Diagnosis not present

## 2019-11-04 DIAGNOSIS — Z836 Family history of other diseases of the respiratory system: Secondary | ICD-10-CM | POA: Diagnosis not present

## 2019-11-04 DIAGNOSIS — Z8546 Personal history of malignant neoplasm of prostate: Secondary | ICD-10-CM | POA: Diagnosis not present

## 2019-11-04 DIAGNOSIS — Z86010 Personal history of colonic polyps: Secondary | ICD-10-CM | POA: Diagnosis not present

## 2019-11-04 DIAGNOSIS — K4090 Unilateral inguinal hernia, without obstruction or gangrene, not specified as recurrent: Secondary | ICD-10-CM | POA: Insufficient documentation

## 2019-11-04 DIAGNOSIS — D176 Benign lipomatous neoplasm of spermatic cord: Secondary | ICD-10-CM | POA: Diagnosis not present

## 2019-11-04 DIAGNOSIS — G8918 Other acute postprocedural pain: Secondary | ICD-10-CM | POA: Diagnosis not present

## 2019-11-04 HISTORY — PX: INSERTION OF MESH: SHX5868

## 2019-11-04 HISTORY — PX: INGUINAL HERNIA REPAIR: SHX194

## 2019-11-04 SURGERY — REPAIR, HERNIA, INGUINAL, ADULT
Anesthesia: General | Site: Groin | Laterality: Left

## 2019-11-04 MED ORDER — LIDOCAINE 2% (20 MG/ML) 5 ML SYRINGE
INTRAMUSCULAR | Status: DC | PRN
  Administered 2019-11-04: 100 mg via INTRAVENOUS

## 2019-11-04 MED ORDER — FENTANYL CITRATE (PF) 100 MCG/2ML IJ SOLN
INTRAMUSCULAR | Status: AC
  Filled 2019-11-04: qty 2

## 2019-11-04 MED ORDER — ROCURONIUM BROMIDE 100 MG/10ML IV SOLN
INTRAVENOUS | Status: DC | PRN
  Administered 2019-11-04: 10 mg via INTRAVENOUS
  Administered 2019-11-04: 50 mg via INTRAVENOUS
  Administered 2019-11-04: 10 mg via INTRAVENOUS

## 2019-11-04 MED ORDER — CELECOXIB 200 MG PO CAPS
ORAL_CAPSULE | ORAL | Status: AC
  Filled 2019-11-04: qty 1

## 2019-11-04 MED ORDER — HYDROCODONE-ACETAMINOPHEN 5-325 MG PO TABS: 1.0000 | ORAL_TABLET | Freq: Four times a day (QID) | ORAL | 0 refills | Status: DC | PRN

## 2019-11-04 MED ORDER — CHLORHEXIDINE GLUCONATE CLOTH 2 % EX PADS: 6.0000 | MEDICATED_PAD | Freq: Once | CUTANEOUS | Status: DC

## 2019-11-04 MED ORDER — BUPIVACAINE HCL (PF) 0.25 % IJ SOLN
INTRAMUSCULAR | Status: DC | PRN
  Administered 2019-11-04: 10 mL

## 2019-11-04 MED ORDER — CELECOXIB 200 MG PO CAPS
200.0000 mg | ORAL_CAPSULE | ORAL | Status: AC
  Administered 2019-11-04: 200 mg via ORAL

## 2019-11-04 MED ORDER — BUPIVACAINE HCL (PF) 0.5 % IJ SOLN
INTRAMUSCULAR | Status: DC | PRN
  Administered 2019-11-04: 30 mL

## 2019-11-04 MED ORDER — PROPOFOL 10 MG/ML IV BOLUS
INTRAVENOUS | Status: DC | PRN
  Administered 2019-11-04: 150 mg via INTRAVENOUS

## 2019-11-04 MED ORDER — MIDAZOLAM HCL 2 MG/2ML IJ SOLN
INTRAMUSCULAR | Status: AC
  Filled 2019-11-04: qty 2

## 2019-11-04 MED ORDER — GABAPENTIN 300 MG PO CAPS
300.0000 mg | ORAL_CAPSULE | ORAL | Status: AC
  Administered 2019-11-04: 300 mg via ORAL

## 2019-11-04 MED ORDER — EPHEDRINE SULFATE 50 MG/ML IJ SOLN
INTRAMUSCULAR | Status: DC | PRN
  Administered 2019-11-04: 5 mg via INTRAVENOUS
  Administered 2019-11-04: 10 mg via INTRAVENOUS
  Administered 2019-11-04: 5 mg via INTRAVENOUS
  Administered 2019-11-04 (×2): 10 mg via INTRAVENOUS

## 2019-11-04 MED ORDER — DEXAMETHASONE SODIUM PHOSPHATE 10 MG/ML IJ SOLN
INTRAMUSCULAR | Status: DC | PRN
  Administered 2019-11-04: 8 mg

## 2019-11-04 MED ORDER — CEFAZOLIN SODIUM-DEXTROSE 2-4 GM/100ML-% IV SOLN
2.0000 g | INTRAVENOUS | Status: AC
  Administered 2019-11-04: 2 g via INTRAVENOUS

## 2019-11-04 MED ORDER — ACETAMINOPHEN 500 MG PO TABS
ORAL_TABLET | ORAL | Status: AC
  Filled 2019-11-04: qty 2

## 2019-11-04 MED ORDER — EPHEDRINE 5 MG/ML INJ
INTRAVENOUS | Status: AC
  Filled 2019-11-04: qty 10

## 2019-11-04 MED ORDER — DEXAMETHASONE SODIUM PHOSPHATE 10 MG/ML IJ SOLN
INTRAMUSCULAR | Status: DC | PRN
  Administered 2019-11-04: 4 mg via INTRAVENOUS

## 2019-11-04 MED ORDER — IBUPROFEN 800 MG PO TABS: 800.0000 mg | ORAL_TABLET | Freq: Three times a day (TID) | ORAL | 0 refills | Status: DC | PRN

## 2019-11-04 MED ORDER — GABAPENTIN 300 MG PO CAPS
ORAL_CAPSULE | ORAL | Status: AC
  Filled 2019-11-04: qty 1

## 2019-11-04 MED ORDER — FENTANYL CITRATE (PF) 100 MCG/2ML IJ SOLN
INTRAMUSCULAR | Status: DC | PRN
Start: 2019-11-04 — End: 2019-11-04
  Administered 2019-11-04 (×2): 50 ug via INTRAVENOUS

## 2019-11-04 MED ORDER — ONDANSETRON HCL 4 MG/2ML IJ SOLN
INTRAMUSCULAR | Status: DC | PRN
  Administered 2019-11-04: 4 mg via INTRAVENOUS

## 2019-11-04 MED ORDER — SUGAMMADEX SODIUM 200 MG/2ML IV SOLN
INTRAVENOUS | Status: DC | PRN
  Administered 2019-11-04: 200 mg via INTRAVENOUS

## 2019-11-04 MED ORDER — CEFAZOLIN SODIUM-DEXTROSE 2-4 GM/100ML-% IV SOLN
INTRAVENOUS | Status: AC
  Filled 2019-11-04: qty 100

## 2019-11-04 MED ORDER — LACTATED RINGERS IV SOLN: INTRAVENOUS | Status: DC

## 2019-11-04 MED ORDER — CLONIDINE HCL (ANALGESIA) 100 MCG/ML EP SOLN
EPIDURAL | Status: DC | PRN
  Administered 2019-11-04: 100 ug

## 2019-11-04 MED ORDER — FENTANYL CITRATE (PF) 100 MCG/2ML IJ SOLN
100.0000 ug | Freq: Once | INTRAMUSCULAR | Status: AC
  Administered 2019-11-04: 100 ug via INTRAVENOUS

## 2019-11-04 MED ORDER — ACETAMINOPHEN 500 MG PO TABS
1000.0000 mg | ORAL_TABLET | ORAL | Status: AC
  Administered 2019-11-04: 1000 mg via ORAL

## 2019-11-04 SURGICAL SUPPLY — 41 items
BLADE CLIPPER SURG (BLADE) ×2 IMPLANT
BLADE SURG 15 STRL LF DISP TIS (BLADE) ×1 IMPLANT
BLADE SURG 15 STRL SS (BLADE) ×1
CHLORAPREP W/TINT 26 (MISCELLANEOUS) ×2 IMPLANT
COVER BACK TABLE 60X90IN (DRAPES) ×2 IMPLANT
COVER MAYO STAND STRL (DRAPES) ×2 IMPLANT
DERMABOND ADVANCED (GAUZE/BANDAGES/DRESSINGS) ×1
DERMABOND ADVANCED .7 DNX12 (GAUZE/BANDAGES/DRESSINGS) ×1 IMPLANT
DRAIN PENROSE 1/2X12 LTX STRL (WOUND CARE) ×2 IMPLANT
DRAPE LAPAROTOMY TRNSV 102X78 (DRAPES) ×2 IMPLANT
DRAPE UTILITY XL STRL (DRAPES) ×2 IMPLANT
ELECT COATED BLADE 2.86 ST (ELECTRODE) ×2 IMPLANT
ELECT REM PT RETURN 9FT ADLT (ELECTROSURGICAL) ×2
ELECTRODE REM PT RTRN 9FT ADLT (ELECTROSURGICAL) ×1 IMPLANT
GLOVE BIOGEL PI IND STRL 6.5 (GLOVE) ×2 IMPLANT
GLOVE BIOGEL PI IND STRL 8 (GLOVE) ×1 IMPLANT
GLOVE BIOGEL PI INDICATOR 6.5 (GLOVE) ×2
GLOVE BIOGEL PI INDICATOR 8 (GLOVE) ×1
GLOVE ECLIPSE 6.5 STRL STRAW (GLOVE) ×4 IMPLANT
GLOVE ECLIPSE 8.0 STRL XLNG CF (GLOVE) ×2 IMPLANT
GOWN STRL REUS W/ TWL LRG LVL3 (GOWN DISPOSABLE) ×3 IMPLANT
GOWN STRL REUS W/TWL LRG LVL3 (GOWN DISPOSABLE) ×3
MESH HERNIA SYS ULTRAPRO LRG (Mesh General) ×2 IMPLANT
NEEDLE HYPO 25X1 1.5 SAFETY (NEEDLE) ×2 IMPLANT
NS IRRIG 1000ML POUR BTL (IV SOLUTION) ×2 IMPLANT
PACK BASIN DAY SURGERY FS (CUSTOM PROCEDURE TRAY) ×2 IMPLANT
PENCIL SMOKE EVACUATOR (MISCELLANEOUS) ×2 IMPLANT
SLEEVE SCD COMPRESS KNEE MED (MISCELLANEOUS) ×2 IMPLANT
SPONGE LAP 4X18 RFD (DISPOSABLE) ×2 IMPLANT
SUT MON AB 4-0 PC3 18 (SUTURE) ×2 IMPLANT
SUT NOVA 0 T19/GS 22DT (SUTURE) IMPLANT
SUT NOVA NAB DX-16 0-1 5-0 T12 (SUTURE) ×4 IMPLANT
SUT VIC AB 2-0 SH 27 (SUTURE) ×1
SUT VIC AB 2-0 SH 27XBRD (SUTURE) ×1 IMPLANT
SUT VIC AB 3-0 54X BRD REEL (SUTURE) IMPLANT
SUT VIC AB 3-0 BRD 54 (SUTURE)
SUT VICRYL 3-0 CR8 SH (SUTURE) ×2 IMPLANT
SUT VICRYL AB 2 0 TIE (SUTURE) IMPLANT
SUT VICRYL AB 2 0 TIES (SUTURE)
SYR CONTROL 10ML LL (SYRINGE) ×2 IMPLANT
TOWEL GREEN STERILE FF (TOWEL DISPOSABLE) ×2 IMPLANT

## 2019-11-04 NOTE — Anesthesia Procedure Notes (Signed)
Anesthesia Regional Block: TAP block   Pre-Anesthetic Checklist: ,, timeout performed, Correct Patient, Correct Site, Correct Laterality, Correct Procedure, Correct Position, site marked, Risks and benefits discussed,  Surgical consent,  Pre-op evaluation,  At surgeon's request and post-op pain management  Laterality: Left  Prep: chloraprep       Needles:   Needle Type: Stimiplex     Needle Length: 9cm      Additional Needles:   Procedures:,,,, ultrasound used (permanent image in chart),,,,  Narrative:  Start time: 11/04/2019 1:02 PM End time: 11/04/2019 1:07 PM Injection made incrementally with aspirations every 5 mL.  Performed by: Personally  Anesthesiologist: Nolon Nations, MD  Additional Notes: Patient tolerated well. Good fascial spread noted.

## 2019-11-04 NOTE — Interval H&P Note (Signed)
History and Physical Interval Note:  11/04/2019 1:27 PM  Bryan Cowan  has presented today for surgery, with the diagnosis of left inguinal hernia.  The various methods of treatment have been discussed with the patient and family. After consideration of risks, benefits and other options for treatment, the patient has consented to  Procedure(s) with comments: LEFT INGUINAL HERNIA REPAIR WITH MESH (Left) - GENERAL AND TAP BLOCK as a surgical intervention.  The patient's history has been reviewed, patient examined, no change in status, stable for surgery.  I have reviewed the patient's chart and labs.  Questions were answered to the patient's satisfaction.     Turner Daniels MD

## 2019-11-04 NOTE — Op Note (Signed)
Preoperative diagnosis: Left inguinal hernia reducible  Postoperative diagnosis: Same  Procedure: Repair of left inguinal hernia with ultra Pro hernia mesh system  Surgeon: Erroll Luna, MD  Anesthesia: General with TAP block and local anesthetic  EBL: Minimal  Specimen: None  IV fluids: Per anesthesia record  Indications for procedure: Patient presents for repair of symptomatic left inguinal hernia.  He had a previous prostatectomy making laparoscopic approach somewhat difficult and therefore opted for open left inguinal hernia repair with mesh.  Risk, benefits and other treatment options were discussed with the patient.  The use of mesh potential complications of mesh use were discussed as well with the patient.  All questions were answered.  He presents today for repair of his left inguinal hernia with mesh. The risk of hernia repair include bleeding,  Infection,   Recurrence of the hernia,  Mesh use, chronic pain,  Organ injury,  Bowel injury,  Bladder injury,   nerve injury with numbness around the incision,  Death,  and worsening of preexisting  medical problems.  The alternatives to surgery have been discussed as well..  Long term expectations of both operative and non operative treatments have been discussed.   The patient agrees to proceed.  Description of procedure: Patient was seen in the procedure reviewed with him today.  All questions were answered.  Left groin was marked as correct site and regional block placed by anesthesia without guarding.  He is then taken back to the operating room.  He is placed supine upon the OR table.  After induction of general anesthesia, left groin was prepped and draped in a sterile fashion and timeout performed.  Proper patient, site and procedure were all verified.  Left inguinal incision was made.  Dissection was carried through Scarpa's fascia into the aponeurosis of the external bleed was identified.  Retractors were placed.  The fibers were  opened in their direction through the external ring to expose the inguinal contents.  Penrose drain was placed around the inguinal cord structures.  There is a large indirect defect.  Was dissected off the cord were reduced back into the preperitoneal space as well as a large lipoma.  These were reduced back into the preperitoneal space.  Once this was done ultra pro hernia system was brought in the field.  The inner leaflet was placed through the internal ring and deployed into the preperitoneal space.  The onlay was placed on the floor of the inguinal canal.  A slit was cut for the cord structures.  This was sewn from the pubic tubercle along the shelving edge of the inguinal ligament.  He was then sutured to the edge of the conjoined tendon medially.  There is ample room for the cord structures and the mesh was closed around the cord structures without any impingement.  The ilioinguinal nerve was out of the operative field and was pushed away and preserved.  The mesh laid nicely with good repair of the large indirect defect.  We then closed the aponeurosis of the external oblique using 2-0 Vicryl.  3-0 Vicryl was used for Scarpa's fascia and 4-0 Monocryl was used to close the skin is particular fashion.  Dermabond applied.  All counts were found to be correct.  Patient was awoke extubated taken to recovery in satisfactory condition.

## 2019-11-04 NOTE — Progress Notes (Signed)
AssistedDr. Germeroth with left, ultrasound guided, transabdominal plane block. Side rails up, monitors on throughout procedure. See vital signs in flow sheet. Tolerated Procedure well.  

## 2019-11-04 NOTE — Anesthesia Preprocedure Evaluation (Signed)
Anesthesia Evaluation  Patient identified by MRN, date of birth, ID band Patient awake    Reviewed: Allergy & Precautions, NPO status , Patient's Chart, lab work & pertinent test results  Airway Mallampati: II  TM Distance: >3 FB Neck ROM: Full    Dental no notable dental hx. (+) Dental Advisory Given, Teeth Intact   Pulmonary neg pulmonary ROS,    Pulmonary exam normal breath sounds clear to auscultation       Cardiovascular negative cardio ROS Normal cardiovascular exam Rhythm:Regular Rate:Normal     Neuro/Psych negative neurological ROS     GI/Hepatic negative GI ROS, Neg liver ROS,   Endo/Other  negative endocrine ROS  Renal/GU negative Renal ROS     Musculoskeletal negative musculoskeletal ROS (+)   Abdominal   Peds  Hematology negative hematology ROS (+)   Anesthesia Other Findings   Reproductive/Obstetrics                             Anesthesia Physical Anesthesia Plan  ASA: II  Anesthesia Plan: General   Post-op Pain Management: GA combined w/ Regional for post-op pain   Induction: Intravenous  PONV Risk Score and Plan: 4 or greater and Ondansetron, Dexamethasone and Treatment may vary due to age or medical condition  Airway Management Planned: LMA and Oral ETT  Additional Equipment:   Intra-op Plan:   Post-operative Plan: Extubation in OR  Informed Consent: I have reviewed the patients History and Physical, chart, labs and discussed the procedure including the risks, benefits and alternatives for the proposed anesthesia with the patient or authorized representative who has indicated his/her understanding and acceptance.     Dental advisory given  Plan Discussed with: CRNA  Anesthesia Plan Comments:         Anesthesia Quick Evaluation

## 2019-11-04 NOTE — Anesthesia Procedure Notes (Signed)
Procedure Name: Intubation Date/Time: 11/04/2019 1:57 PM Performed by: Lavonia Dana, CRNA Pre-anesthesia Checklist: Patient identified, Emergency Drugs available, Suction available and Patient being monitored Patient Re-evaluated:Patient Re-evaluated prior to induction Oxygen Delivery Method: Circle system utilized Preoxygenation: Pre-oxygenation with 100% oxygen Induction Type: IV induction Ventilation: Mask ventilation without difficulty Laryngoscope Size: Mac and 4 Grade View: Grade I Tube type: Oral Tube size: 7.5 mm Number of attempts: 1 Airway Equipment and Method: Bite block and Stylet Placement Confirmation: positive ETCO2,  ETT inserted through vocal cords under direct vision and breath sounds checked- equal and bilateral Secured at: 24 cm Tube secured with: Tape Dental Injury: Teeth and Oropharynx as per pre-operative assessment

## 2019-11-04 NOTE — Anesthesia Postprocedure Evaluation (Signed)
Anesthesia Post Note  Patient: Bryan Cowan  Procedure(s) Performed: LEFT INGUINAL HERNIA REPAIR WITH MESH (Left Groin) INSERTION OF MESH (Left Groin)     Patient location during evaluation: PACU Anesthesia Type: General Level of consciousness: sedated and patient cooperative Pain management: pain level controlled Vital Signs Assessment: post-procedure vital signs reviewed and stable Respiratory status: spontaneous breathing Cardiovascular status: stable Anesthetic complications: no   No complications documented.  Last Vitals:  Vitals:   11/04/19 1540 11/04/19 1606  BP: 135/78 132/84  Pulse: 80 78  Resp: 14 16  Temp:  36.4 C  SpO2: 96% 96%    Last Pain:  Vitals:   11/04/19 1606  TempSrc:   PainSc: 0-No pain                 Nolon Nations

## 2019-11-04 NOTE — Transfer of Care (Signed)
Immediate Anesthesia Transfer of Care Note  Patient: Mussa Groesbeck  Procedure(s) Performed: LEFT INGUINAL HERNIA REPAIR WITH MESH (Left Groin) INSERTION OF MESH (Left Groin)  Patient Location: PACU  Anesthesia Type:General  Level of Consciousness: awake, alert  and oriented  Airway & Oxygen Therapy: Patient Spontanous Breathing and Patient connected to face mask oxygen  Post-op Assessment: Report given to RN and Post -op Vital signs reviewed and stable  Post vital signs: Reviewed and stable  Last Vitals:  Vitals Value Taken Time  BP 135/82 11/04/19 1515  Temp 36.4 C 11/04/19 1515  Pulse 83 11/04/19 1519  Resp 14 11/04/19 1519  SpO2 100 % 11/04/19 1519  Vitals shown include unvalidated device data.  Last Pain:  Vitals:   11/04/19 1515  TempSrc:   PainSc: Asleep         Complications: No complications documented.

## 2019-11-04 NOTE — Discharge Instructions (Signed)
[facility] _______[facility] ° °UMBILICAL OR INGUINAL HERNIA REPAIR: POST OP INSTRUCTIONS ° °Always review your discharge instruction sheet given to you by the facility where your surgery was performed. °IF YOU HAVE DISABILITY OR FAMILY LEAVE FORMS, YOU MUST BRING THEM TO THE OFFICE FOR PROCESSING.   °DO NOT GIVE THEM TO YOUR DOCTOR. ° °1. A  prescription for pain medication may be given to you upon discharge.  Take your pain medication as prescribed, if needed.  If narcotic pain medicine is not needed, then you may take acetaminophen (Tylenol) or ibuprofen (Advil) as needed. °2. Take your usually prescribed medications unless otherwise directed. °If you need a refill on your pain medication, please contact your pharmacy.  They will contact our office to request authorization. Prescriptions will not be filled after 5 pm or on week-ends. °3. You should follow a light diet the first 24 hours after arrival home, such as soup and crackers, etc.  Be sure to include lots of fluids daily.  Resume your normal diet the day after surgery. °4.Most patients will experience some swelling and bruising around the umbilicus or in the groin and scrotum.  Ice packs and reclining will help.  Swelling and bruising can take several days to resolve.  °6. It is common to experience some constipation if taking pain medication after surgery.  Increasing fluid intake and taking a stool softener (such as Colace) will usually help or prevent this problem from occurring.  A mild laxative (Milk of Magnesia or Miralax) should be taken according to package directions if there are no bowel movements after 48 hours. °7. Unless discharge instructions indicate otherwise, you may remove your bandages 24-48 hours after surgery, and you may shower at that time.  You may have steri-strips (small skin tapes) in place directly over the incision.  These strips should be left on the skin for 7-10 days.  If your surgeon used skin glue on the  incision, you may shower in 24 hours.  The glue will flake off over the next 2-3 weeks.  Any sutures or staples will be removed at the office during your follow-up visit. °8. ACTIVITIES:  You may resume regular (light) daily activities beginning the next day--such as daily self-care, walking, climbing stairs--gradually increasing activities as tolerated.  You may have sexual intercourse when it is comfortable.  Refrain from any heavy lifting or straining until approved by your doctor. ° °a.You may drive when you are no longer taking prescription pain medication, you can comfortably wear a seatbelt, and you can safely maneuver your car and apply brakes. °b.RETURN TO WORK:   °_____________________________________________ ° °9.You should see your doctor in the office for a follow-up appointment approximately 2-3 weeks after your surgery.  Make sure that you call for this appointment within a day or two after you arrive home to insure a convenient appointment time. °10.OTHER INSTRUCTIONS: _________________________ °   _____________________________________ ° °WHEN TO CALL YOUR DOCTOR: °1. Fever over 101.0 °2. Inability to urinate °3. Nausea and/or vomiting °4. Extreme swelling or bruising °5. Continued bleeding from incision. °6. Increased pain, redness, or drainage from the incision ° °The clinic staff is available to answer your questions during regular business hours.  Please don’t hesitate to call and ask to speak to one of the nurses for clinical concerns.  If you have a medical emergency, go to the nearest emergency room or call 911.  A surgeon from [facility] is always on call at the hospital ° ° °  [address], [place], [place], [place]  [address] ?  [address], [place], [place]   [address] [phone] ? [phone] ? FAX [phone] Web site: [email or web address] Post Anesthesia Home Care Instructions  Activity: Get plenty of rest for the remainder of the day. A  responsible individual must stay with you for 24 hours following the procedure.  For the next 24 hours, DO NOT: -Drive a car -Paediatric nurse -Drink alcoholic beverages -Take any medication unless instructed by your physician -Make any legal decisions or sign important papers.  Meals: Start with liquid foods such as gelatin or soup. Progress to regular foods as tolerated. Avoid greasy, spicy, heavy foods. If nausea and/or vomiting occur, drink only clear liquids until the nausea and/or vomiting subsides. Call your physician if vomiting continues.  Special Instructions/Symptoms: Your throat may feel dry or sore from the anesthesia or the breathing tube placed in your throat during surgery. If this causes discomfort, gargle with warm salt water. The discomfort should disappear within 24 hours.    Next dose of Tylenol can be given at 7:00pm if needed.  Next dose of Motrin/Ibuprofen can be given at 9:00pm if needed.

## 2019-11-08 ENCOUNTER — Encounter (HOSPITAL_BASED_OUTPATIENT_CLINIC_OR_DEPARTMENT_OTHER): Payer: Self-pay | Admitting: Surgery

## 2019-11-17 DIAGNOSIS — H40022 Open angle with borderline findings, high risk, left eye: Secondary | ICD-10-CM | POA: Diagnosis not present

## 2019-11-29 ENCOUNTER — Other Ambulatory Visit: Payer: Self-pay

## 2019-11-29 ENCOUNTER — Encounter: Payer: Self-pay | Admitting: Dermatology

## 2019-11-29 ENCOUNTER — Ambulatory Visit (INDEPENDENT_AMBULATORY_CARE_PROVIDER_SITE_OTHER): Payer: Medicare Other | Admitting: Dermatology

## 2019-11-29 DIAGNOSIS — D229 Melanocytic nevi, unspecified: Secondary | ICD-10-CM

## 2019-11-29 DIAGNOSIS — L821 Other seborrheic keratosis: Secondary | ICD-10-CM

## 2019-11-29 DIAGNOSIS — L570 Actinic keratosis: Secondary | ICD-10-CM | POA: Diagnosis not present

## 2019-11-29 DIAGNOSIS — D225 Melanocytic nevi of trunk: Secondary | ICD-10-CM

## 2019-11-29 DIAGNOSIS — Z1283 Encounter for screening for malignant neoplasm of skin: Secondary | ICD-10-CM | POA: Diagnosis not present

## 2019-11-29 DIAGNOSIS — D1801 Hemangioma of skin and subcutaneous tissue: Secondary | ICD-10-CM

## 2019-11-29 DIAGNOSIS — L729 Follicular cyst of the skin and subcutaneous tissue, unspecified: Secondary | ICD-10-CM

## 2019-11-29 DIAGNOSIS — Z85828 Personal history of other malignant neoplasm of skin: Secondary | ICD-10-CM

## 2019-11-29 MED ORDER — TOLAK 4 % EX CREA: 1.0000 "application " | TOPICAL_CREAM | Freq: Every day | CUTANEOUS | 0 refills | Status: DC

## 2019-11-29 NOTE — Progress Notes (Signed)
   Follow-Up Visit   Subjective  Bryan Cowan is a 68 y.o. male who presents for the following: Annual Exam (scale on scalp, no other concerns).  General skin check Location:  Duration:  Quality:  Associated Signs/Symptoms: Modifying Factors:  Severity:  Timing: Context: Wife concerned with 2 crusts on front of scalp  Objective  Well appearing patient in no apparent distress; mood and affect are within normal limits.  A full examination was performed including scalp, head, eyes, ears, nose, lips, neck, chest, axillae, abdomen, back, buttocks, bilateral upper extremities, bilateral lower extremities, hands, feet, fingers, toes, fingernails, and toenails. All findings within normal limits unless otherwise noted below.   Assessment & Plan   Several issues discussed with Bryan Cowan date of birth Aug 02, 1950.  There is no recurrence and essentially no scar at the site of the nonmelanoma skin cancer removed from behind the left ear; as expected it did leave a little light spot.  Legs and waist up skin examination showed no atypical moles, no melanoma, probably no new nonmole skin cancers.  The barnacles that Bryan Cowan pointed out to me on the upper forehead and frontal scalp represent thick precancers and, along with a half dozen smaller spots,  were treated with 5-second liquid nitrogen freeze.  You can expect these to swell and they may peel in the next 2 weeks.  There is no special care.  You are a candidate in January to use a cream on the forehead and scalp called Tolak; the nurse reviewed with you obtaining this for a cash price under $50 by mail order or you can try your regular pharmacy.  The goal of this cream is to treat the area with these rough spots rather than just the individual spots.  It requires a total of 28 applications and will make you moderately sun sensitive so we like using this in the winter.  If there is too much irritation during the therapy, stop use for 1  week and restart applying the cream every other night.  Any problem with this you may call me anytime.  Clinically benign spots include multiple keratoses on the back and the dark spot by the left collarbone, multiple small red dots called angiomas on the torso, small cyst on the left outer back, and a possible dermatofibroma on the left inner back knee area.  None of these currently require intervention. AK (actinic keratosis) (8) Head - Anterior (Face)  Destruction of lesion - Head - Anterior (Face) Complexity: simple   Destruction method: cryotherapy   Informed consent: discussed and consent obtained   Timeout:  patient name, date of birth, surgical site, and procedure verified Lesion destroyed using liquid nitrogen: Yes   Region frozen until ice ball extended beyond lesion: Yes   Cryotherapy cycles:  5 Outcome: patient tolerated procedure well with no complications    Personal history of skin cancer Left Occipital Scalp  Annual skin check  Nevus Mid Back  Self examined skin twice annually  Seborrheic keratosis (2) Neck - Posterior  Leave if stable     I, Lavonna Monarch, MD, have reviewed all documentation for this visit.  The documentation on 11/29/19 for the exam, diagnosis, procedures, and orders are all accurate and complete.

## 2019-11-29 NOTE — Progress Notes (Signed)
Per patient previous ln2 on the scalp, also wife usually marks spots with pen but she is out of town.No other concerns.  ln2 scalp x 8

## 2019-11-29 NOTE — Patient Instructions (Addendum)
Several issues discussed with Bryan Cowan date of birth 17-Jan-1951.  There is no recurrence and essentially no scar at the site of the nonmelanoma skin cancer removed from behind the left ear; as expected it did leave a little light spot.  Legs and waist up skin examination showed no atypical moles, no melanoma, probably no new nonmole skin cancers.  The barnacles that Bryan Cowan pointed out to me on the upper forehead and frontal scalp represent thick precancers and, along with a half dozen smaller spots,  were treated with 5-second liquid nitrogen freeze.  You can expect these to swell and they may peel in the next 2 weeks.  There is no special care.  You are a candidate in January to use a cream on the forehead and scalp called Tolak; the nurse reviewed with you obtaining this for a cash price under $50 by mail order or you can try your regular pharmacy.  The goal of this cream is to treat the area with these rough spots rather than just the individual spots.  It requires a total of 28 applications and will make you moderately sun sensitive so we like using this in the winter.  If there is too much irritation during the therapy, stop use for 1 week and restart applying the cream every other night.  Any problem with this you may call me anytime.  Clinically benign spots include multiple keratoses on the back and the dark spot by the left collarbone, multiple small red dots called angiomas on the torso, small cyst on the left outer back, and a possible dermatofibroma on the left inner back knee area.  None of these currently require intervention.

## 2019-12-06 DIAGNOSIS — Z23 Encounter for immunization: Secondary | ICD-10-CM | POA: Diagnosis not present

## 2019-12-21 DIAGNOSIS — Z23 Encounter for immunization: Secondary | ICD-10-CM | POA: Diagnosis not present

## 2019-12-27 DIAGNOSIS — H40022 Open angle with borderline findings, high risk, left eye: Secondary | ICD-10-CM | POA: Diagnosis not present

## 2019-12-27 DIAGNOSIS — H35372 Puckering of macula, left eye: Secondary | ICD-10-CM | POA: Diagnosis not present

## 2019-12-27 DIAGNOSIS — Z961 Presence of intraocular lens: Secondary | ICD-10-CM | POA: Diagnosis not present

## 2019-12-27 DIAGNOSIS — H33001 Unspecified retinal detachment with retinal break, right eye: Secondary | ICD-10-CM | POA: Diagnosis not present

## 2019-12-27 DIAGNOSIS — H401113 Primary open-angle glaucoma, right eye, severe stage: Secondary | ICD-10-CM | POA: Diagnosis not present

## 2020-01-05 DIAGNOSIS — Z20822 Contact with and (suspected) exposure to covid-19: Secondary | ICD-10-CM | POA: Diagnosis not present

## 2020-01-07 ENCOUNTER — Other Ambulatory Visit: Payer: Medicare Other

## 2020-02-07 ENCOUNTER — Other Ambulatory Visit: Payer: Self-pay

## 2020-02-07 ENCOUNTER — Encounter: Payer: Self-pay | Admitting: Nurse Practitioner

## 2020-02-08 ENCOUNTER — Encounter: Payer: Self-pay | Admitting: Family

## 2020-02-08 ENCOUNTER — Ambulatory Visit (INDEPENDENT_AMBULATORY_CARE_PROVIDER_SITE_OTHER): Payer: Medicare Other | Admitting: Family

## 2020-02-08 VITALS — BP 122/66 | HR 76 | Temp 97.2°F | Ht 73.0 in | Wt 225.6 lb

## 2020-02-08 DIAGNOSIS — L02212 Cutaneous abscess of back [any part, except buttock]: Secondary | ICD-10-CM | POA: Diagnosis not present

## 2020-02-08 MED ORDER — CEPHALEXIN 500 MG PO CAPS: 500.0000 mg | ORAL_CAPSULE | Freq: Four times a day (QID) | ORAL | 0 refills | Status: DC

## 2020-02-08 NOTE — Patient Instructions (Signed)
Incision and Drainage, Care After This sheet gives you information about how to care for yourself after your procedure. Your health care provider may also give you more specific instructions. If you have problems or questions, contact your health care provider. What can I expect after the procedure? After the procedure, it is common to have:  Pain or discomfort around the incision site.  Blood, fluid, or pus (drainage) from the incision.  Redness and firm skin around the incision site. Follow these instructions at home: Medicines  Take over-the-counter and prescription medicines only as told by your health care provider.  If you were prescribed an antibiotic medicine, use or take it as told by your health care provider. Do not stop using the antibiotic even if you start to feel better. Wound care Follow instructions from your health care provider about how to take care of your wound. Make sure you:  Wash your hands with soap and water before and after you change your bandage (dressing). If soap and water are not available, use hand sanitizer.  Change your dressing and packing as told by your health care provider. ? If your dressing is dry or stuck when you try to remove it, moisten or wet the dressing with saline or water so that it can be removed without harming your skin or tissues. ? If your wound is packed, leave it in place until your health care provider tells you to remove it. To remove the packing, moisten or wet the packing with saline or water so that it can be removed without harming your skin or tissues.  Leave stitches (sutures), skin glue, or adhesive strips in place. These skin closures may need to stay in place for 2 weeks or longer. If adhesive strip edges start to loosen and curl up, you may trim the loose edges. Do not remove adhesive strips completely unless your health care provider tells you to do that. Check your wound every day for signs of infection. Check  for:  More redness, swelling, or pain.  More fluid or blood.  Warmth.  Pus or a bad smell. If you were sent home with a drain tube in place, follow instructions from your health care provider about:  How to empty it.  How to care for it at home.  General instructions  Rest the affected area.  Do not take baths, swim, or use a hot tub until your health care provider approves. Ask your health care provider if you may take showers. You may only be allowed to take sponge baths.  Return to your normal activities as told by your health care provider. Ask your health care provider what activities are safe for you. Your health care provider may put you on activity or lifting restrictions.  The incision will continue to drain. It is normal to have some clear or slightly bloody drainage. The amount of drainage should lessen each day.  Do not apply any creams, ointments, or liquids unless you have been told to by your health care provider.  Keep all follow-up visits as told by your health care provider. This is important. Contact a health care provider if:  Your cyst or abscess returns.  You have a fever or chills.  You have more redness, swelling, or pain around your incision.  You have more fluid or blood coming from your incision.  Your incision feels warm to the touch.  You have pus or a bad smell coming from your incision.  You have red streaks   above or below the incision site. Get help right away if:  You have severe pain or bleeding.  You cannot eat or drink without vomiting.  You have decreased urine output.  You become short of breath.  You have chest pain.  You cough up blood.  The affected area becomes numb or starts to tingle. These symptoms may represent a serious problem that is an emergency. Do not wait to see if the symptoms will go away. Get medical help right away. Call your local emergency services (911 in the [place] Do not drive yourself to the  hospital. Summary  After this procedure, it is common to have fluid, blood, or pus coming from the surgery site.  Follow all home care instructions. You will be told how to take care of your incision, how to check for infection, and how to take medicines.  If you were prescribed an antibiotic medicine, take it as told by your health care provider. Do not stop taking the antibiotic even if you start to feel better.  Contact a health care provider if you have increased redness, swelling, or pain around your incision. Get help right away if you have chest pain, you vomit, you cough up blood, or you have shortness of breath.  Keep all follow-up visits as told by your health care provider. This is important. This information is not intended to replace advice given to you by your health care provider. Make sure you discuss any questions you have with your health care provider. Document Revised: 01/05/2018 Document Reviewed: 01/05/2018 [employer]vier Patient Education  2020 [employer]   

## 2020-02-08 NOTE — Progress Notes (Signed)
Acute Office Visit  Subjective:    Patient ID: Bryan Cowan, male    DOB: 01/18/1951, 68 y.o.   MRN: [id number]  Chief Complaint  Patient presents with  . Mass    Lump on back that looks infected present x 2 weeks. Little itchy at time.     HPI Patient is in today with c/o a sore lump on his back that has been present x 2 weeks. Recalls a similar incident about 7 years ago that had to be I&D by dermatology. Has not been trying any therapy  Past Medical History:  Diagnosis Date  . Basal cell carcinoma 05/19/2017   behind left ear cx3 80fu  . History of retinal detachment   . Hyperlipidemia   . Prostate cancer [facility])     Past Surgical History:  Procedure Laterality Date  . BASAL CELL CARCINOMA EXCISION  2019  . COLONOSCOPY  2013  . INGUINAL HERNIA REPAIR Left 11/04/2019   Procedure: LEFT INGUINAL HERNIA REPAIR WITH MESH;  Surgeon: Erroll Luna, MD;  Location: [place];  Service: General;  Laterality: Left;  . INSERTION OF MESH Left 11/04/2019   Procedure: INSERTION OF MESH;  Surgeon: Erroll Luna, MD;  Location: [place] [facility];  Service: General;  Laterality: Left;  . PROSTATECTOMY  2004  . REPLACEMENT TOTAL KNEE BILATERAL Bilateral 2009  . RETINAL DETACHMENT SURGERY Right 2015  . RETINAL LASER PROCEDURE  2017  . SIGMOIDOSCOPY  2000    Family History  Problem Relation Age of Onset  . Colon cancer Neg Hx   . Esophageal cancer Neg Hx   . Prostate cancer Neg Hx   . Rectal cancer Neg Hx     Social History   Socioeconomic History  . Marital status: Married    Spouse name: Not on file  . Number of children: Not on file  . Years of education: Not on file  . Highest education level: Not on file  Occupational History  . Not on file  Tobacco Use  . Smoking status: Never Smoker  . Smokeless tobacco: Never Used  Vaping Use  . Vaping Use: Never used  Substance and Sexual Activity  . Alcohol use: Yes    Comment: Beer per day  . Drug use:  Never  . Sexual activity: Not Currently  Other Topics Concern  . Not on file  Social History Narrative   Moved here from [place], [place] in 05/2015.   Works part time at [employer].   Social Determinants of Health   Financial Resource Strain: Low Risk   . Difficulty of Paying Living Expenses: Not hard at all  Food Insecurity: No Food Insecurity  . Worried About Charity fundraiser in the Last Year: Never true  . Ran Out of Food in the Last Year: Never true  Transportation Needs: No Transportation Needs  . Lack of Transportation (Medical): No  . Lack of Transportation (Non-Medical): No  Physical Activity: Not on file  Stress: Not on file  Social Connections: Not on file  Intimate Partner Violence: Not on file    Outpatient Medications Prior to Visit  Medication Sig Dispense Refill  . Ascorbic Acid (VITAMIN C) 1000 MG tablet Take 1,000 mg by mouth daily.    [name] b complex vitamins tablet Take 1 tablet by mouth daily.    . brimonidine (ALPHAGAN) 0.2 % ophthalmic solution Place 1 drop into the right eye 2 (two) times daily.    . calcium-vitamin D (OSCAL WITH  D) 500-200 MG-UNIT tablet Take 1 tablet by mouth.    . Coenzyme Q10 100 MG capsule Take 100 mg by mouth.     . Fluorouracil (TOLAK) 4 % CREA Apply 1 application topically at bedtime. 40 g 0  . magnesium 30 MG tablet Take 30 mg by mouth 2 (two) times daily.    [name] MILK THISTLE PO Take by mouth.    . Multiple Vitamin (MULTIVITAMIN) capsule Take by mouth.    . Omega-3 Fatty Acids (FISH OIL) 1000 MG CAPS Take by mouth.    . brimonidine (ALPHAGAN P) 0.1 % SOLN     . HYDROcodone-acetaminophen (NORCO/VICODIN) 5-325 MG tablet Take 1 tablet by mouth every 6 (six) hours as needed for moderate pain. (Patient not taking: No sig reported) 15 tablet 0  . ibuprofen (ADVIL) 800 MG tablet Take 1 tablet (800 mg total) by mouth every 8 (eight) hours as needed. 30 tablet 0   No facility-administered medications prior to visit.    No Known  Allergies  Review of Systems  Constitutional: Negative.   Respiratory: Negative.   Cardiovascular: Negative.   Musculoskeletal: Negative.   Skin:       Abscess to the left back  All other systems reviewed and are negative.      Objective:    Physical Exam Vitals reviewed.  Constitutional:      Appearance: Normal appearance. He is normal weight.  Cardiovascular:     Pulses: Normal pulses.  Pulmonary:     Effort: Pulmonary effort is normal.     Breath sounds: Normal breath sounds.  Musculoskeletal:     Cervical back: Normal range of motion and neck supple.  Skin:    General: Skin is warm and dry.     Comments: Incision and Drainage: Under sterile technique, 2 ccs of Lidocaine with epi was used to anesthetize the abscess. 1/2 cm incision made to expel a large amount of foul smelling pustular thick discharge. Patient tolerated the procedure well. Dressed and bandaged.   Neurological:     Mental Status: He is alert and oriented to person, place, and time.  Psychiatric:        Mood and Affect: Mood normal.        Behavior: Behavior normal.     BP 122/66   Pulse 76   Temp (!) 97.2 F (36.2 C) (Temporal)   Ht 6\' 1"  (1.854 m)   Wt 225 lb 9.6 oz (102.3 kg)   SpO2 97%   BMI 29.76 kg/m  Wt Readings from Last 3 Encounters:  02/08/20 225 lb 9.6 oz (102.3 kg)  11/04/19 223 lb 5.2 oz (101.3 kg)  08/26/19 221 lb 6.4 oz (100.4 kg)    There are no preventive care reminders to display for this patient.  There are no preventive care reminders to display for this patient.   Lab Results  Component Value Date   TSH 1.80 01/13/2018   Lab Results  Component Value Date   WBC 6.0 11/01/2019   HGB 13.8 11/01/2019   HCT 43.5 11/01/2019   MCV 83.8 11/01/2019   PLT 216 11/01/2019   Lab Results  Component Value Date   NA 138 11/01/2019   K 4.4 11/01/2019   CO2 25 11/01/2019   GLUCOSE 102 (H) 11/01/2019   BUN 10 11/01/2019   CREATININE 0.75 11/01/2019   BILITOT 0.6  11/01/2019   ALKPHOS 51 11/01/2019   AST 29 11/01/2019   ALT 19 11/01/2019   PROT 6.4 (L) 11/01/2019  ALBUMIN 3.7 11/01/2019   CALCIUM 8.9 11/01/2019   ANIONGAP 8 11/01/2019   GFR 107.67 09/09/2019   Lab Results  Component Value Date   CHOL 248 (H) 02/04/2018   Lab Results  Component Value Date   HDL 54.60 02/04/2018   Lab Results  Component Value Date   LDLCALC 181 (H) 02/04/2018   Lab Results  Component Value Date   TRIG 63.0 02/04/2018   Lab Results  Component Value Date   CHOLHDL 5 02/04/2018   No results found for: HGBA1C     Assessment & Plan:   Problem List Items Addressed This Visit   None   Visit Diagnoses    Abscess of back    -  Primary       Meds ordered this encounter  Medications  . cephALEXin (KEFLEX) 500 MG capsule    Sig: Take 1 capsule (500 mg total) by mouth 4 (four) times daily.    Dispense:  28 capsule    Refill:  0    Call the office with any questions or concerns. Aftercare instructions provided. Kennyth Arnold, FNP

## 2020-02-25 ENCOUNTER — Encounter: Payer: Self-pay | Admitting: Nurse Practitioner

## 2020-02-29 ENCOUNTER — Ambulatory Visit (INDEPENDENT_AMBULATORY_CARE_PROVIDER_SITE_OTHER): Payer: Medicare Other

## 2020-02-29 VITALS — Ht 73.0 in | Wt 220.0 lb

## 2020-02-29 DIAGNOSIS — Z0000 Encounter for general adult medical examination without abnormal findings: Secondary | ICD-10-CM

## 2020-02-29 NOTE — Patient Instructions (Signed)
Mr. Bryan Cowan , Thank you for taking time to complete your Medicare Wellness Visit. I appreciate your ongoing commitment to your health goals. Please review the following plan we discussed and let me know if I can assist you in the future.   Screening recommendations/referrals: Colonoscopy: Completed 02/08/2019-Due 02/07/2022 Recommended yearly ophthalmology/optometry visit for glaucoma screening and checkup Recommended yearly dental visit for hygiene and checkup  Vaccinations: Influenza vaccine: Up to date Pneumococcal vaccine: Completed vaccines Tdap vaccine: Up to date -Due-06/18/2025 Shingles vaccine: Discuss with pharmacy Covid-19: Completed vaccines  Advanced directives: Please bring a copy for your chart  Conditions/risks identified: See problem list  Next appointment: Follow up in one year for your annual wellness visit.   Preventive Care 7 Years and Older, Male Preventive care refers to lifestyle choices and visits with your health care provider that can promote health and wellness. What does preventive care include?  A yearly physical exam. This is also called an annual well check.  Dental exams once or twice a year.  Routine eye exams. Ask your health care provider how often you should have your eyes checked.  Personal lifestyle choices, including:  Daily care of your teeth and gums.  Regular physical activity.  Eating a healthy diet.  Avoiding tobacco and drug use.  Limiting alcohol use.  Practicing safe sex.  Taking low doses of aspirin every day.  Taking vitamin and mineral supplements as recommended by your health care provider. What happens during an annual well check? The services and screenings done by your health care provider during your annual well check will depend on your age, overall health, lifestyle risk factors, and family history of disease. Counseling  Your health care provider may ask you questions about your:  Alcohol use.  Tobacco  use.  Drug use.  Emotional well-being.  Home and relationship well-being.  Sexual activity.  Eating habits.  History of falls.  Memory and ability to understand (cognition).  Work and work Statistician. Screening  You may have the following tests or measurements:  Height, weight, and BMI.  Blood pressure.  Lipid and cholesterol levels. These may be checked every 5 years, or more frequently if you are over 75 years old.  Skin check.  Lung cancer screening. You may have this screening every year starting at age 15 if you have a 30-pack-year history of smoking and currently smoke or have quit within the past 15 years.  Fecal occult blood test (FOBT) of the stool. You may have this test every year starting at age 44.  Flexible sigmoidoscopy or colonoscopy. You may have a sigmoidoscopy every 5 years or a colonoscopy every 10 years starting at age 56.  Prostate cancer screening. Recommendations will vary depending on your family history and other risks.  Hepatitis C blood test.  Hepatitis B blood test.  Sexually transmitted disease (STD) testing.  Diabetes screening. This is done by checking your blood sugar (glucose) after you have not eaten for a while (fasting). You may have this done every 1-3 years.  Abdominal aortic aneurysm (AAA) screening. You may need this if you are a current or former smoker.  Osteoporosis. You may be screened starting at age 50 if you are at high risk. Talk with your health care provider about your test results, treatment options, and if necessary, the need for more tests. Vaccines  Your health care provider may recommend certain vaccines, such as:  Influenza vaccine. This is recommended every year.  Tetanus, diphtheria, and acellular pertussis (Tdap, Td)  vaccine. You may need a Td booster every 10 years.  Zoster vaccine. You may need this after age 15.  Pneumococcal 13-valent conjugate (PCV13) vaccine. One dose is recommended after age  65.  Pneumococcal polysaccharide (PPSV23) vaccine. One dose is recommended after age 48. Talk to your health care provider about which screenings and vaccines you need and how often you need them. This information is not intended to replace advice given to you by your health care provider. Make sure you discuss any questions you have with your health care provider. Document Released: 03/03/2015 Document Revised: 10/25/2015 Document Reviewed: 12/06/2014 Elsevier Interactive Patient Education  2017 [place] Prevention in the Home Falls can cause injuries. They can happen to people of all ages. There are many things you can do to make your home safe and to help prevent falls. What can I do on the outside of my home?  Regularly fix the edges of walkways and driveways and fix any cracks.  Remove anything that might make you trip as you walk through a door, such as a raised step or threshold.  Trim any bushes or trees on the path to your home.  Use bright outdoor lighting.  Clear any walking paths of anything that might make someone trip, such as rocks or tools.  Regularly check to see if handrails are loose or broken. Make sure that both sides of any steps have handrails.  Any raised decks and porches should have guardrails on the edges.  Have any leaves, snow, or ice cleared regularly.  Use sand or salt on walking paths during winter.  Clean up any spills in your garage right away. This includes oil or grease spills. What can I do in the bathroom?  Use night lights.  Install grab bars by the toilet and in the tub and shower. Do not use towel bars as grab bars.  Use non-skid mats or decals in the tub or shower.  If you need to sit down in the shower, use a plastic, non-slip stool.  Keep the floor dry. Clean up any water that spills on the floor as soon as it happens.  Remove soap buildup in the tub or shower regularly.  Attach bath mats securely with double-sided  non-slip rug tape.  Do not have throw rugs and other things on the floor that can make you trip. What can I do in the bedroom?  Use night lights.  Make sure that you have a light by your bed that is easy to reach.  Do not use any sheets or blankets that are too big for your bed. They should not hang down onto the floor.  Have a firm chair that has side arms. You can use this for support while you get dressed.  Do not have throw rugs and other things on the floor that can make you trip. What can I do in the kitchen?  Clean up any spills right away.  Avoid walking on wet floors.  Keep items that you use a lot in easy-to-reach places.  If you need to reach something above you, use a strong step stool that has a grab bar.  Keep electrical cords out of the way.  Do not use floor polish or wax that makes floors slippery. If you must use wax, use non-skid floor wax.  Do not have throw rugs and other things on the floor that can make you trip. What can I do with my stairs?  Do not leave  any items on the stairs.  Make sure that there are handrails on both sides of the stairs and use them. Fix handrails that are broken or loose. Make sure that handrails are as long as the stairways.  Check any carpeting to make sure that it is firmly attached to the stairs. Fix any carpet that is loose or worn.  Avoid having throw rugs at the top or bottom of the stairs. If you do have throw rugs, attach them to the floor with carpet tape.  Make sure that you have a light switch at the top of the stairs and the bottom of the stairs. If you do not have them, ask someone to add them for you. What else can I do to help prevent falls?  Wear shoes that:  Do not have high heels.  Have rubber bottoms.  Are comfortable and fit you well.  Are closed at the toe. Do not wear sandals.  If you use a stepladder:  Make sure that it is fully opened. Do not climb a closed stepladder.  Make sure that both  sides of the stepladder are locked into place.  Ask someone to hold it for you, if possible.  Clearly mark and make sure that you can see:  Any grab bars or handrails.  First and last steps.  Where the edge of each step is.  Use tools that help you move around (mobility aids) if they are needed. These include:  Canes.  Walkers.  Scooters.  Crutches.  Turn on the lights when you go into a dark area. Replace any light bulbs as soon as they burn out.  Set up your furniture so you have a clear path. Avoid moving your furniture around.  If any of your floors are uneven, fix them.  If there are any pets around you, be aware of where they are.  Review your medicines with your doctor. Some medicines can make you feel dizzy. This can increase your chance of falling. Ask your doctor what other things that you can do to help prevent falls. This information is not intended to replace advice given to you by your health care provider. Make sure you discuss any questions you have with your health care provider. Document Released: 12/01/2008 Document Revised: 07/13/2015 Document Reviewed: 03/11/2014 Elsevier Interactive Patient Education  2017 [employer].

## 2020-02-29 NOTE — Progress Notes (Signed)
Subjective:   Bryan Cowan is a 70 y.o. male who presents for Medicare Annual/Subsequent preventive examination.  I connected with Zyah today by telephone and verified that I am speaking with the correct person using two identifiers. Location patient: home Location provider: work Persons participating in the virtual visit: patient, Marine scientist.    I discussed the limitations, risks, security and privacy concerns of performing an evaluation and management service by telephone and the availability of in person appointments. I also discussed with the patient that there may be a patient responsible charge related to this service. The patient expressed understanding and verbally consented to this telephonic visit.    Interactive audio and video telecommunications were attempted between this provider and patient, however failed, due to patient having technical difficulties OR patient did not have access to video capability.  We continued and completed visit with audio only.  Some vital signs may be absent or patient reported.   Time Spent with patient on telephone encounter: 20 minutes    Review of Systems     Cardiac Risk Factors include: advanced age (>66men, >52 women);dyslipidemia;male gender     Objective:    Today's Vitals   02/29/20 0817  Weight: 220 lb (99.8 kg)  Height: 6\' 1"  (1.854 m)   Body mass index is 29.03 kg/m.  Advanced Directives 02/29/2020 11/04/2019 10/29/2019 02/24/2019 02/04/2018  Does Patient Have a Medical Advance Directive? Yes Yes Yes Yes Yes  Type of Paramedic of [place];Living will - [place];Living will [place];Living will [place];Living will  Does patient want to make changes to medical advance directive? - No - Guardian declined No - Patient declined No - Patient declined No - Patient declined  Copy of [place] in Chart? No - copy requested - No - copy  requested No - copy requested No - copy requested  Would patient like information on creating a medical advance directive? - No - Patient declined - - -    Current Medications (verified) Outpatient Encounter Medications as of 02/29/2020  Medication Sig  . Ascorbic Acid (VITAMIN C) 1000 MG tablet Take 1,000 mg by mouth daily.  [name] b complex vitamins tablet Take 1 tablet by mouth daily.  . brimonidine (ALPHAGAN) 0.2 % ophthalmic solution Place 1 drop into the right eye 2 (two) times daily.  . calcium-vitamin D (OSCAL WITH D) 500-200 MG-UNIT tablet Take 1 tablet by mouth.  . Coenzyme Q10 100 MG capsule Take 100 mg by mouth.   . Fluorouracil (TOLAK) 4 % CREA Apply 1 application topically at bedtime.  . magnesium 30 MG tablet Take 30 mg by mouth 2 (two) times daily.  [name] MILK THISTLE PO Take by mouth.  . Multiple Vitamin (MULTIVITAMIN) capsule Take by mouth.  . Omega-3 Fatty Acids (FISH OIL) 1000 MG CAPS Take by mouth.  . [DISCONTINUED] cephALEXin (KEFLEX) 500 MG capsule Take 1 capsule (500 mg total) by mouth 4 (four) times daily.   No facility-administered encounter medications on file as of 02/29/2020.    Allergies (verified) Patient has no known allergies.   History: Past Medical History:  Diagnosis Date  . Basal cell carcinoma 05/19/2017   behind left ear cx3 16fu  . History of retinal detachment   . Hyperlipidemia   . Prostate cancer [facility])    Past Surgical History:  Procedure Laterality Date  . BASAL CELL CARCINOMA EXCISION  2019  . COLONOSCOPY  2013  . HERNIA REPAIR    .  INGUINAL HERNIA REPAIR Left 11/04/2019   Procedure: LEFT INGUINAL HERNIA REPAIR WITH MESH;  Surgeon: Erroll Luna, MD;  Location: [place];  Service: General;  Laterality: Left;  . INSERTION OF MESH Left 11/04/2019   Procedure: INSERTION OF MESH;  Surgeon: Erroll Luna, MD;  Location: [place];  Service: General;  Laterality: Left;  . JOINT REPLACEMENT    . PROSTATECTOMY   2004  . REPLACEMENT TOTAL KNEE BILATERAL Bilateral 2009  . RETINAL DETACHMENT SURGERY Right 2015  . RETINAL LASER PROCEDURE  2017  . SIGMOIDOSCOPY  2000   Family History  Problem Relation Age of Onset  . Colon cancer Neg Hx   . Esophageal cancer Neg Hx   . Prostate cancer Neg Hx   . Rectal cancer Neg Hx    Social History   Socioeconomic History  . Marital status: Married    Spouse name: Not on file  . Number of children: Not on file  . Years of education: Not on file  . Highest education level: Not on file  Occupational History  . Not on file  Tobacco Use  . Smoking status: Never Smoker  . Smokeless tobacco: Never Used  Vaping Use  . Vaping Use: Never used  Substance and Sexual Activity  . Alcohol use: Yes    Comment: Beer per day  . Drug use: Never  . Sexual activity: Not Currently  Other Topics Concern  . Not on file  Social History Narrative   Moved here from [place], [place] in 05/2015.   Works part time at [employer].   Social Determinants of Health   Financial Resource Strain: Low Risk   . Difficulty of Paying Living Expenses: Not hard at all  Food Insecurity: No Food Insecurity  . Worried About Charity fundraiser in the Last Year: Never true  . Ran Out of Food in the Last Year: Never true  Transportation Needs: No Transportation Needs  . Lack of Transportation (Medical): No  . Lack of Transportation (Non-Medical): No  Physical Activity: Sufficiently Active  . Days of Exercise per Week: 5 days  . Minutes of Exercise per Session: 90 min  Stress: No Stress Concern Present  . Feeling of Stress : Not at all  Social Connections: Moderately Isolated  . Frequency of Communication with Friends and Family: More than three times a week  . Frequency of Social Gatherings with Friends and Family: More than three times a week  . Attends Religious Services: Never  . Active Member of Clubs or Organizations: No  . Attends Archivist Meetings: Never  . Marital  Status: Married    Tobacco Counseling Counseling given: Not Answered   Clinical Intake:  Pre-visit preparation completed: Yes  Pain : No/denies pain     Nutritional Status: BMI 25 -29 Overweight Nutritional Risks: None Diabetes: No  How often do you need to have someone help you when you read instructions, pamphlets, or other written materials from your doctor or pharmacy?: 1 - Never  Diabetic?No  Interpreter Needed?: No  Information entered by :: Caroleen Hamman LPN   Activities of Daily Living In your present state of health, do you have any difficulty performing the following activities: 02/29/2020 11/04/2019  Hearing? Y N  Comment hearing aids -  Vision? N N  Difficulty concentrating or making decisions? N N  Walking or climbing stairs? N N  Dressing or bathing? N N  Doing errands, shopping? N -  Conservation officer, nature and  eating ? N -  Using the Toilet? N -  In the past six months, have you accidently leaked urine? N -  Do you have problems with loss of bowel control? N -  Managing your Medications? N -  Managing your Finances? N -  Housekeeping or managing your Housekeeping? N -  Some recent data might be hidden    Patient Care Team: Nche, Charlene Brooke, NP as PCP - General (Internal Medicine) Garrel Ridgel, DPM as Consulting Physician (Podiatry) Lavonna Monarch, MD as Consulting Physician (Dermatology)  Indicate any recent Medical Services you may have received from other than [facility] providers in the past year (date may be approximate).     Assessment:   This is a routine wellness examination for Itzae.  Hearing/Vision screen  Hearing Screening   125Hz  250Hz  500Hz  1000Hz  2000Hz  3000Hz  4000Hz  6000Hz  8000Hz   Right ear:           Left ear:           Comments: Bilateral hearing aids  Vision Screening Comments: Wears glasses Last eye exam-2021-Dr.Groat  Dietary issues and exercise activities discussed: Current Exercise Habits: Home exercise routine, Type of  exercise: strength training/weights;walking, Time (Minutes): 60, Frequency (Times/Week): 5, Weekly Exercise (Minutes/Week): 300, Intensity: Mild, Exercise limited by: None identified  Goals    . Maintain healthy active lifestyle.    . Patient Stated     Improve balance      Depression Screen PHQ 2/9 Scores 02/29/2020 02/08/2020 02/24/2019 02/04/2018  PHQ - 2 Score 0 0 0 0    Fall Risk Fall Risk  02/29/2020 02/08/2020 02/24/2019 02/04/2018 02/04/2018  Falls in the past year? 0 0 0 0 0  Number falls in past yr: 0 - - - -  Injury with Fall? 0 - - - -  Follow up Falls prevention discussed - Education provided;Falls prevention discussed Falls evaluation completed -    FALL RISK PREVENTION PERTAINING TO THE HOME:  Any stairs in or around the home? No  Home free of loose throw rugs in walkways, pet beds, electrical cords, etc? Yes  Adequate lighting in your home to reduce risk of falls? Yes   ASSISTIVE DEVICES UTILIZED TO PREVENT FALLS:  Life alert? No  Use of a cane, walker or w/c? No  Grab bars in the bathroom? No  Shower chair or bench in shower? No  Elevated toilet seat or a handicapped toilet? No   TIMED UP AND GO:  Was the test performed? No . Phone visit   Cognitive Function:Normal cognitive status assessed by this Nurse Health Advisor. No abnormalities found.   MMSE - Mini Mental State Exam 02/04/2018  Orientation to time 5  Orientation to Place 5  Registration 3  Attention/ Calculation 5  Recall 2  Language- name 2 objects 2  Language- repeat 1  Language- follow 3 step command 3  Language- read & follow direction 1  Write a sentence 1  Copy design 1  Total score 29        Immunizations Immunization History  Administered Date(s) Administered  . Fluad Quad(high Dose 65+) 12/02/2018  . Influenza Split 12/03/2019  . Influenza, High Dose Seasonal PF 11/19/2015, 01/13/2018  . Moderna Sars-Covid-2 Vaccination 04/02/2019, 04/30/2019, 12/21/2019  . Pneumococcal  Conjugate-13 03/05/2016  . Pneumococcal Polysaccharide-23 01/13/2018  . Tdap 06/19/2015  . Zoster 02/28/2009    TDAP status: Up to date  Flu Vaccine status: Up to date  Pneumococcal vaccine status: Up to date  Covid-19 vaccine  status: Completed vaccines  Qualifies for Shingles Vaccine? Yes   Zostavax completed Yes   Shingrix Completed?: No.    Education has been provided regarding the importance of this vaccine. Patient has been advised to call insurance company to determine out of pocket expense if they have not yet received this vaccine. Advised may also receive vaccine at local pharmacy or Health Dept. Verbalized acceptance and understanding.  Screening Tests Health Maintenance  Topic Date Due  . COVID-19 Vaccine (4 - Booster for Moderna series) 06/19/2020  . COLONOSCOPY (Pts 45-46yrs Insurance coverage will need to be confirmed)  02/07/2022  . TETANUS/TDAP  06/18/2025  . INFLUENZA VACCINE  Completed  . PNA vac Low Risk Adult  Completed  . Hepatitis C Screening  Discontinued    Health Maintenance  There are no preventive care reminders to display for this patient.  Colorectal cancer screening: Type of screening: Colonoscopy. Completed 02/08/2019. Repeat every 3 years  Lung Cancer Screening: (Low Dose CT Chest recommended if Age 52-80 years, 30 pack-year currently smoking OR have quit w/in 15years.) does not qualify.    Additional Screening:  Hepatitis C Screening: does qualify; Discuss with PCP  Vision Screening: Recommended annual ophthalmology exams for early detection of glaucoma and other disorders of the eye. Is the patient up to date with their annual eye exam?  Yes  Who is the provider or what is the name of the office in which the patient attends annual eye exams? Dr. [name]    Dental Screening: Recommended annual dental exams for proper oral hygiene  Community Resource Referral / Chronic Care Management: CRR required this visit?  No   CCM required this  visit?  No      Plan:     I have personally reviewed and noted the following in the patient's chart:   . Medical and social history . Use of alcohol, tobacco or illicit drugs  . Current medications and supplements . Functional ability and status . Nutritional status . Physical activity . Advanced directives . List of other physicians . Hospitalizations, surgeries, and ER visits in previous 12 months . Vitals . Screenings to include cognitive, depression, and falls . Referrals and appointments  In addition, I have reviewed and discussed with patient certain preventive protocols, quality metrics, and best practice recommendations. A written personalized care plan for preventive services as well as general preventive health recommendations were provided to patient.   Due to this being a telephonic visit, the after visit summary with patients personalized plan was offered to patient via mail or my-chart. Patient would like to access on my-chart.   Marta Antu, LPN   [id number]  Nurse Health Advisor  Nurse Notes: None

## 2020-05-02 DIAGNOSIS — M25512 Pain in left shoulder: Secondary | ICD-10-CM | POA: Diagnosis not present

## 2020-05-02 DIAGNOSIS — S43422A Sprain of left rotator cuff capsule, initial encounter: Secondary | ICD-10-CM | POA: Diagnosis not present

## 2020-05-02 DIAGNOSIS — M19012 Primary osteoarthritis, left shoulder: Secondary | ICD-10-CM | POA: Diagnosis not present

## 2020-05-02 DIAGNOSIS — M7542 Impingement syndrome of left shoulder: Secondary | ICD-10-CM | POA: Diagnosis not present

## 2020-06-06 DIAGNOSIS — H40022 Open angle with borderline findings, high risk, left eye: Secondary | ICD-10-CM | POA: Diagnosis not present

## 2020-06-06 DIAGNOSIS — Z961 Presence of intraocular lens: Secondary | ICD-10-CM | POA: Diagnosis not present

## 2020-06-06 DIAGNOSIS — H401113 Primary open-angle glaucoma, right eye, severe stage: Secondary | ICD-10-CM | POA: Diagnosis not present

## 2020-06-06 DIAGNOSIS — H33001 Unspecified retinal detachment with retinal break, right eye: Secondary | ICD-10-CM | POA: Diagnosis not present

## 2020-06-06 DIAGNOSIS — H35372 Puckering of macula, left eye: Secondary | ICD-10-CM | POA: Diagnosis not present

## 2020-07-07 DIAGNOSIS — M545 Low back pain, unspecified: Secondary | ICD-10-CM | POA: Diagnosis not present

## 2020-07-11 DIAGNOSIS — H40022 Open angle with borderline findings, high risk, left eye: Secondary | ICD-10-CM | POA: Diagnosis not present

## 2020-07-11 DIAGNOSIS — H35372 Puckering of macula, left eye: Secondary | ICD-10-CM | POA: Diagnosis not present

## 2020-07-11 DIAGNOSIS — H33001 Unspecified retinal detachment with retinal break, right eye: Secondary | ICD-10-CM | POA: Diagnosis not present

## 2020-07-11 DIAGNOSIS — Z961 Presence of intraocular lens: Secondary | ICD-10-CM | POA: Diagnosis not present

## 2020-07-11 DIAGNOSIS — H401113 Primary open-angle glaucoma, right eye, severe stage: Secondary | ICD-10-CM | POA: Diagnosis not present

## 2020-08-28 ENCOUNTER — Other Ambulatory Visit: Payer: Self-pay

## 2020-08-28 ENCOUNTER — Telehealth: Payer: Self-pay | Admitting: Family Medicine

## 2020-08-28 ENCOUNTER — Encounter: Payer: Self-pay | Admitting: Family Medicine

## 2020-08-28 ENCOUNTER — Ambulatory Visit (INDEPENDENT_AMBULATORY_CARE_PROVIDER_SITE_OTHER): Payer: Medicare Other | Admitting: Family Medicine

## 2020-08-28 VITALS — BP 122/72 | HR 61 | Temp 97.8°F | Ht 73.0 in | Wt 225.4 lb

## 2020-08-28 DIAGNOSIS — E860 Dehydration: Secondary | ICD-10-CM | POA: Diagnosis not present

## 2020-08-28 NOTE — Progress Notes (Signed)
[facility] [address] [place] [address] Dept: [phone] Dept Fax: [phone]  Office Visit  Subjective:    Patient ID: Bryan Cowan, male    DOB: September 15, 1950, 70 y.o..   MRN: [id number]  Chief Complaint  Patient presents with   Acute Visit    C/o having some vertigo off/on with some nausea x 5 days.  He has taken magnesium with some relief.       History of Present Illness:  Patient is in today for evaluation of recent episodes of dizziness associated with nausea. Bryan Cowan states that starting last Wed., he has had several episodes of dizziness. Sometimes these have felt like vertigo (room spinning). Other times, it has been more of a woozy feeling. He has noted this more with bending and then standing up. He has not noted this with any particular head rotation. He denies any focal weakness, numbness or speech issues. Bryan Cowan had left inguinal hernia repair last year. He notes the nausea seems similar to what he had then, but admits he did not have the lightheadedness/vertigo with that condition. He does feel he may be developing a similar issue on the right.  Past Medical History: Patient Active Problem List   Diagnosis Date Noted   Chronic granulomatous disease with uncertain genetic cause ([place]) 09/16/2019   Left retinal lattice degeneration 09/13/2019   Primary open angle glaucoma of right eye, severe stage 09/13/2019   Optic disc atrophy, right 09/13/2019   Macular pucker, left eye 09/13/2019   Left inguinal pain 12/02/2018   Left upper quadrant abdominal pain 12/02/2018   Family history of malignant melanoma 01/13/2018   History of retinal detachment 01/13/2018   Vision loss, right eye 01/13/2018   Prostate cancer ([place])    Hyperlipidemia    History of prostate cancer 02/29/2016   Bilateral tinnitus 02/29/2016   Bilateral hearing loss 02/29/2016   Past Surgical History:  Procedure Laterality Date    BASAL CELL CARCINOMA EXCISION  2019   COLONOSCOPY  2013   [place] Left 11/04/2019   Procedure: LEFT INGUINAL HERNIA REPAIR WITH MESH;  Surgeon: Erroll Luna, MD;  Location: [place];  Service: General;  Laterality: Left;   INSERTION OF MESH Left 11/04/2019   Procedure: INSERTION OF MESH;  Surgeon: Erroll Luna, MD;  Location: [place];  Service: General;  Laterality: Left;   JOINT REPLACEMENT     PROSTATECTOMY  2004   REPLACEMENT TOTAL KNEE BILATERAL Bilateral 2009   RETINAL DETACHMENT SURGERY Right 2015   RETINAL LASER PROCEDURE  2017   SIGMOIDOSCOPY  2000   Family History  Problem Relation Age of Onset   Colon cancer Neg Hx    Esophageal cancer Neg Hx    Prostate cancer Neg Hx    Rectal cancer Neg Hx    Outpatient Medications Prior to Visit  Medication Sig Dispense Refill   Ascorbic Acid (VITAMIN C) 1000 MG tablet Take 1,000 mg by mouth daily.     b complex vitamins tablet Take 1 tablet by mouth daily.     brimonidine (ALPHAGAN) 0.2 % ophthalmic solution Place 1 drop into the right eye 2 (two) times daily.     brimonidine-timolol (COMBIGAN) 0.2-0.5 % ophthalmic solution Apply 1 drop to eye 2 (two) times daily.     calcium-vitamin D (OSCAL WITH D) 500-200 MG-UNIT tablet Take 1 tablet by mouth.     Coenzyme Q10  100 MG capsule Take 100 mg by mouth.      magnesium 30 MG tablet Take 30 mg by mouth 2 (two) times daily.     MILK THISTLE PO Take by mouth.     Multiple Vitamin (MULTIVITAMIN) capsule Take by mouth.     Omega-3 Fatty Acids (FISH OIL) 1000 MG CAPS Take by mouth.     timolol (TIMOPTIC) 0.5 % ophthalmic solution timolol maleate 0.5 % eye drops  PLACE 1 DROP INTO BOTH EYES TWICE DAILY     Fluorouracil (TOLAK) 4 % CREA Apply 1 application topically at bedtime. (Patient not taking: Reported on 08/28/2020) 40 g 0   No facility-administered medications prior to visit.   No Known Allergies    Objective:    Today's Vitals   08/28/20 1436  BP: 122/72  Pulse: 61  Temp: 97.8 F (36.6 C)  TempSrc: Temporal  SpO2: 95%  Weight: 225 lb 6.4 oz (102.2 kg)  Height: 6\' 1"  (1.854 m)   Body mass index is 29.74 kg/m.   General: Well developed, well nourished. No acute distress. HEENT: Normocephalic, non-traumatic. PERRL, EOMI. Conjunctiva clear. Fundiscopic exam shows normal    disc and vasculature. External ears normal. EAC and TMs normal bilaterally. Mucous membranes moist.   Oropharynx clear. Good dentition. Lungs: Clear to auscultation bilaterally. No wheezing, rales or rhonchi. CV: RRR without murmurs or rubs. Pulses 2+ bilaterally. Neuro:CN II-XII grossly intact. No focal neurological deficits. Dix-Halpike maneuver negative for nystagmus. Psych: Alert and oriented. Normal mood and affect.  Health Maintenance Due  Topic Date Due   Zoster Vaccines- Shingrix (1 of 2) Never done   COVID-19 Vaccine (4 - Booster for Moderna series) 03/22/2020     Assessment & Plan:   1. Dehydration I suspect this may be due to some mild dehydration. We discussed Bryan Cowan increasing his fluid intake. We will monitor for resolution of his symptoms. If not improved or worsening, he should return for reassessment.  Haydee Salter, MD

## 2020-08-28 NOTE — Telephone Encounter (Signed)
Pt is wanting to transfer his care from [facility] to Dr. Gena Fray. Is this OK? I can call him back to schedule.

## 2020-09-05 DIAGNOSIS — Z23 Encounter for immunization: Secondary | ICD-10-CM | POA: Diagnosis not present

## 2020-09-12 ENCOUNTER — Encounter (INDEPENDENT_AMBULATORY_CARE_PROVIDER_SITE_OTHER): Payer: Medicare Other | Admitting: Ophthalmology

## 2020-09-12 NOTE — Telephone Encounter (Signed)
Pt is checking on status of this message.

## 2020-10-10 ENCOUNTER — Ambulatory Visit: Payer: Medicare Other | Admitting: Physician Assistant

## 2020-11-13 DIAGNOSIS — H40022 Open angle with borderline findings, high risk, left eye: Secondary | ICD-10-CM | POA: Diagnosis not present

## 2020-11-13 DIAGNOSIS — H401113 Primary open-angle glaucoma, right eye, severe stage: Secondary | ICD-10-CM | POA: Diagnosis not present

## 2020-11-13 DIAGNOSIS — H35372 Puckering of macula, left eye: Secondary | ICD-10-CM | POA: Diagnosis not present

## 2020-11-13 DIAGNOSIS — H33001 Unspecified retinal detachment with retinal break, right eye: Secondary | ICD-10-CM | POA: Diagnosis not present

## 2020-11-13 DIAGNOSIS — Z961 Presence of intraocular lens: Secondary | ICD-10-CM | POA: Diagnosis not present

## 2020-11-14 ENCOUNTER — Ambulatory Visit: Payer: Medicare Other | Admitting: Physician Assistant

## 2020-11-16 ENCOUNTER — Encounter (INDEPENDENT_AMBULATORY_CARE_PROVIDER_SITE_OTHER): Payer: Self-pay

## 2020-11-20 ENCOUNTER — Encounter (INDEPENDENT_AMBULATORY_CARE_PROVIDER_SITE_OTHER): Payer: Medicare Other | Admitting: Ophthalmology

## 2020-11-21 ENCOUNTER — Encounter: Payer: Self-pay | Admitting: Family Medicine

## 2020-11-24 ENCOUNTER — Other Ambulatory Visit: Payer: Self-pay

## 2020-11-27 ENCOUNTER — Ambulatory Visit (INDEPENDENT_AMBULATORY_CARE_PROVIDER_SITE_OTHER): Payer: Medicare Other

## 2020-11-27 ENCOUNTER — Ambulatory Visit (INDEPENDENT_AMBULATORY_CARE_PROVIDER_SITE_OTHER): Payer: Medicare Other | Admitting: Family Medicine

## 2020-11-27 ENCOUNTER — Encounter: Payer: Self-pay | Admitting: Family Medicine

## 2020-11-27 ENCOUNTER — Other Ambulatory Visit: Payer: Self-pay

## 2020-11-27 VITALS — BP 112/68 | HR 49 | Temp 97.7°F | Ht 73.0 in | Wt 223.4 lb

## 2020-11-27 DIAGNOSIS — I440 Atrioventricular block, first degree: Secondary | ICD-10-CM | POA: Diagnosis not present

## 2020-11-27 DIAGNOSIS — Z8546 Personal history of malignant neoplasm of prostate: Secondary | ICD-10-CM | POA: Diagnosis not present

## 2020-11-27 DIAGNOSIS — K753 Granulomatous hepatitis, not elsewhere classified: Secondary | ICD-10-CM | POA: Diagnosis not present

## 2020-11-27 DIAGNOSIS — J8410 Pulmonary fibrosis, unspecified: Secondary | ICD-10-CM | POA: Diagnosis not present

## 2020-11-27 DIAGNOSIS — K219 Gastro-esophageal reflux disease without esophagitis: Secondary | ICD-10-CM | POA: Insufficient documentation

## 2020-11-27 DIAGNOSIS — R0609 Other forms of dyspnea: Secondary | ICD-10-CM

## 2020-11-27 DIAGNOSIS — R9389 Abnormal findings on diagnostic imaging of other specified body structures: Secondary | ICD-10-CM | POA: Diagnosis not present

## 2020-11-27 DIAGNOSIS — Z23 Encounter for immunization: Secondary | ICD-10-CM | POA: Diagnosis not present

## 2020-11-27 DIAGNOSIS — I700 Atherosclerosis of aorta: Secondary | ICD-10-CM | POA: Insufficient documentation

## 2020-11-27 DIAGNOSIS — R009 Unspecified abnormalities of heart beat: Secondary | ICD-10-CM

## 2020-11-27 DIAGNOSIS — J40 Bronchitis, not specified as acute or chronic: Secondary | ICD-10-CM | POA: Diagnosis not present

## 2020-11-27 DIAGNOSIS — R1031 Right lower quadrant pain: Secondary | ICD-10-CM | POA: Diagnosis not present

## 2020-11-27 DIAGNOSIS — I898 Other specified noninfective disorders of lymphatic vessels and lymph nodes: Secondary | ICD-10-CM | POA: Diagnosis not present

## 2020-11-27 DIAGNOSIS — E785 Hyperlipidemia, unspecified: Secondary | ICD-10-CM

## 2020-11-27 DIAGNOSIS — R053 Chronic cough: Secondary | ICD-10-CM

## 2020-11-27 LAB — LIPID PANEL
Cholesterol: 240 mg/dL — ABNORMAL HIGH (ref 0–200)
HDL: 53.3 mg/dL (ref >39.00)
LDL Cholesterol: 166 mg/dL — ABNORMAL HIGH (ref 0–99)
NonHDL: 186.26
Total CHOL/HDL Ratio: 4
Triglycerides: 100 mg/dL (ref 0.0–149.0)
VLDL: 20 mg/dL (ref 0.0–40.0)

## 2020-11-27 LAB — PSA: PSA: 0.04 ng/mL — ABNORMAL LOW (ref 0.10–4.00)

## 2020-11-27 NOTE — Progress Notes (Signed)
[facility] [address] [place] [address] Dept: [phone] Dept Fax: [phone]  Transfer of Care Office Visit  Subjective:    Patient ID: Bryan Cowan, male    DOB: 08/15/1950, 70 y.o..   MRN: [id number]  Chief Complaint  Patient presents with   Establish Care    [facility]- establish care. Wants flu shot today.      History of Present Illness:  Patient is in today to establish care. Bryan Cowan was born in [place], [place]. He spent time in the [employer], stationed in [place], [place]. 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 He lived at time near [place], [place] and later in [place], [place]. He moved to [facility] in 2017 at his retirement to be closer to one of his children. He denies tobacco or drug use. He only occasionally drinks alcohol. he gets regular physical activity through [employer], working on both cardiovascular exercises and weight training.  Bryan Cowan has a history of having had a prostatectomy due to prostate cancer. He notes he has not had a PSA test in some time.  Bryan Cowan has a number of eye issues. He sees Dr. [name] related to his glaucoma and Dr. Zadie Rhine related to his retinal issues.  Bryan Cowan has a prior history of granulomas noted in his liver. He is not sure of the significance of these or if other workup needs to be done.  Bryan Cowan has a history of hyperlipidemia. He has also had aortic atherosclerosis noted on a prior scan. He prefers not to start on a statin.  Bryan Cowan has a history of gastroesophageal reflux. He feels this may have been contributing to an issue of chronic cough that he has developed over the past several months. His cough is productive at times, but not others. He notes it is worse when he eats later in the evening. He prefers to avoid medications for this.  Bryan Cowan notes that he has had some mild dyspnea on exertion  over the past 3 months or so. He finds this occurs during his workouts or when he goes up hills while walking. He admits to occasional brief twinges of chest pain, but nothing sustained.  Bryan Cowan has a history of having had a left inguinal hernia repair last year. He is now having intermittent right inguinal pain with occasional bulging in the right inguinal area. He notes Dr. Wilma Flavin, who did his prior surgery, seemed unconcerned for this at the time of the left hernia repair. He would like to be evaluated by a different surgeon.  Bryan Cowan had an abnormal CT scan in the past that showed multiple granulomas in his liver. He did grow up in the [place]. He ahs not had significant travel to the [facility].  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   Left retinal lattice degeneration 09/13/2019   Primary open angle glaucoma of right eye, severe stage 09/13/2019   Optic disc atrophy, right 09/13/2019   Macular pucker, left eye 09/13/2019   History of retinal detachment 01/13/2018   Vision loss, right eye 01/13/2018   Hyperlipidemia    History of prostate cancer 02/29/2016   Bilateral tinnitus 02/29/2016   Bilateral hearing loss 02/29/2016   Past Surgical History:  Procedure Laterality Date   BASAL CELL CARCINOMA EXCISION  2019   COLONOSCOPY  2013   INGUINAL HERNIA REPAIR Left 11/04/2019   Procedure: LEFT INGUINAL HERNIA REPAIR WITH MESH;  Surgeon: Erroll Luna, MD;  Location: [place];  Service: General;  Laterality: Left;   INSERTION OF MESH Left 11/04/2019   Procedure: INSERTION OF MESH;  Surgeon: Erroll Luna, MD;  Location: [place];  Service: General;  Laterality: Left;   PROSTATECTOMY  2004   RETINAL DETACHMENT SURGERY Right 2015   RETINAL LASER PROCEDURE  2017   SIGMOIDOSCOPY  2000   TOTAL KNEE ARTHROPLASTY Bilateral 2009   Family  History  Problem Relation Age of Onset   COPD Mother    Cancer Father        Lung   Cancer Daughter        Melanoma   Cancer Paternal Uncle        Liver   Colon cancer Neg Hx    Esophageal cancer Neg Hx    Prostate cancer Neg Hx    Rectal cancer Neg Hx    Outpatient Medications Prior to Visit  Medication Sig Dispense Refill   Ascorbic Acid (VITAMIN C) 1000 MG tablet Take 1,000 mg by mouth daily.     b complex vitamins tablet Take 1 tablet by mouth daily.     brimonidine (ALPHAGAN) 0.2 % ophthalmic solution Place 1 drop into the right eye 2 (two) times daily.     calcium-vitamin D (OSCAL WITH D) 500-200 MG-UNIT tablet Take 1 tablet by mouth.     Coenzyme Q10 100 MG capsule Take 100 mg by mouth.      magnesium 30 MG tablet Take 30 mg by mouth 2 (two) times daily.     MILK THISTLE PO Take by mouth.     Multiple Vitamin (MULTIVITAMIN) capsule Take by mouth.     Omega-3 Fatty Acids (FISH OIL) 1000 MG CAPS Take by mouth.     timolol (TIMOPTIC) 0.5 % ophthalmic solution timolol maleate 0.5 % eye drops  PLACE 1 DROP INTO BOTH EYES TWICE DAILY     brimonidine-timolol (COMBIGAN) 0.2-0.5 % ophthalmic solution Apply 1 drop to eye 2 (two) times daily.     No facility-administered medications prior to visit.   No Known Allergies    Objective:   Today's Vitals   11/27/20 0855  BP: 112/68  Pulse: (!) 49  Temp: 97.7 F (36.5 C)  TempSrc: Temporal  SpO2: 93%  Weight: 223 lb 6.4 oz (101.3 kg)  Height: 6\' 1"  (1.854 m)   Body mass index is 29.47 kg/m.   General: Well developed, well nourished. No acute distress. Lungs: Clear to auscultation bilaterally. No wheezing, rales or rhonchi. CV: Irregular heart rate with frequent pauses. No murmurs or rubs. Pulses 2+ bilaterally. Psych: Alert and oriented. Normal mood and affect.  Health Maintenance Due  Topic Date Due   Zoster Vaccines- Shingrix (1 of 2) Never done   COVID-19 Vaccine (4 - Booster for Moderna series) 03/14/2020    INFLUENZA VACCINE  09/18/2020   Lab Results CMP Latest Ref Rng & Units 11/01/2019 09/09/2019 12/02/2018  Glucose 70 - 99 mg/dL 102(H) 101(H) 99  BUN 8 - 23 mg/dL 10 14 11   Creatinine 0.61 - 1.24 mg/dL 0.75 0.72 0.66  Sodium 135 - 145 mmol/L 138 137 137  Potassium 3.5 - 5.1 mmol/L 4.4 4.4 3.8  Chloride 98 - 111 mmol/L 105 103 103  CO2 22 - 32 mmol/L 25 28 25   Calcium 8.9 - 10.3 mg/dL  8.9 9.9 9.2  Total Protein 6.5 - 8.1 g/dL 6.4(L) - 6.9  Total Bilirubin 0.3 - 1.2 mg/dL 0.6 - 0.7  Alkaline Phos 38 - 126 U/L 51 - 53  AST 15 - 41 U/L 29 - 23  ALT 0 - 44 U/L 19 - 17   Imaging: CT of Abdomen and Pelvis (12/02/2018) IMPRESSION: 1. No acute abnormality in the abdomen or pelvis. 2. Prostatectomy. Mild distention of the urinary bladder without hydronephrosis. 3. Several small calcifications in the liver and spleen. Findings are suggestive for old granulomatous disease. 4. Multiple small low densities in the liver are too small to definitively characterize but could represent small hepatic cysts.  CT of Abdomen and Pelvis (09/09/2019) IMPRESSION: Chronic changes of prior granulomatous disease. No acute abnormality to correspond with the patient's given clinical symptomatology is seen. No hernia is noted. Stable lipoma in the right oblique musculature is seen.    Chest x-ray: There are a few smooth scattered calcified lesions int he lungs. There is a larger, irregular lesion in the right hilar area that may represent a larger granuloma.  EKG: Sinus rhythm (rate= 67) with a 1st degree heart block, occasional multifocal PACs, and occasional PVCs.  Assessment & Plan:   1. Abnormal heart rate 2. First degree heart block 3. Dyspnea on exertion Mr. Fells has some minor abnormalities on his EKG, none of which explains the DOE that he complains of. I will refer him to cardiology for an assessment.  - EKG 12-Lead - Ambulatory referral to Cardiology  4. Hyperlipidemia, unspecified  hyperlipidemia type Due for repeat of lipids.   - Lipid panel  5. Granuloma of liver We discussed the CT result. 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 As he is currently asymptomatic and past liver serum studies have been normal, there would be no indication for further assessment.  6. Right inguinal pain In light of the current inguinal complaints and the prior left inguinal hernia, I will refer Mr. Giangregorio to general surgery for assessment.  - Ambulatory referral to General Surgery  7. Abnormal chest x-ray The chest x-ray shows what could be a larger granuloma in the right perihilar area. He has not experienced weight loss or fever. This may be related to the same process that produced the liver granulomas noted on CT. However, he has a past history of prostate cancer. I will refer him to pulmonology to assess.  - DG Chest 2 View - Ambulatory referral to Pulmonology  8. Chronic cough The etiology of this is not readily apparent. He feels this may be related to GERD, which is certainly a possibility.  9. Gastroesophageal reflux disease without esophagitis Mr. Sandt prefers to manage this by eating earlier int he evening. I also recommended he consider adding 3" blocks under the head of his bed.  10. History of prostate cancer We will repeat his PSA for screening.  - PSA  11. Need for influenza vaccination  - Flu Vaccine QUAD High Dose(Fluad)  Haydee Salter, MD

## 2020-11-28 ENCOUNTER — Ambulatory Visit (INDEPENDENT_AMBULATORY_CARE_PROVIDER_SITE_OTHER): Payer: Medicare Other | Admitting: Ophthalmology

## 2020-11-28 ENCOUNTER — Encounter (INDEPENDENT_AMBULATORY_CARE_PROVIDER_SITE_OTHER): Payer: Self-pay | Admitting: Ophthalmology

## 2020-11-28 DIAGNOSIS — H35372 Puckering of macula, left eye: Secondary | ICD-10-CM | POA: Diagnosis not present

## 2020-11-28 DIAGNOSIS — H35412 Lattice degeneration of retina, left eye: Secondary | ICD-10-CM | POA: Diagnosis not present

## 2020-11-28 NOTE — Progress Notes (Signed)
11/28/2020     CHIEF COMPLAINT Patient presents for  Chief Complaint  Patient presents with   Retina Follow Up      HISTORY OF PRESENT ILLNESS: Bryan Cowan is a 70 y.o. male who presents to the clinic today for:   HPI     Retina Follow Up   Patient presents with  Other.  In both eyes.  This started 1 year ago.  Severity is mild.  Duration of 1 year.  Since onset it is stable.        Comments   1 year fu OU and OCT/FP Pt states VA OU stable since last visit. Pt denies FOL, floaters, or ocular pain OU.  Pt reports using Brimonidine and Timolol BID OU       Last edited by Kendra Opitz, COA on 11/28/2020  7:58 AM.      Referring physician: Flossie Buffy, NP [place],  [place] [id number]  HISTORICAL INFORMATION:   Selected notes from the MEDICAL RECORD NUMBER       CURRENT MEDICATIONS: Current Outpatient Medications (Ophthalmic Drugs)  Medication Sig   brimonidine (ALPHAGAN) 0.2 % ophthalmic solution Place 1 drop into the right eye 2 (two) times daily.   timolol (TIMOPTIC) 0.5 % ophthalmic solution timolol maleate 0.5 % eye drops  PLACE 1 DROP INTO BOTH EYES TWICE DAILY   No current facility-administered medications for this visit. (Ophthalmic Drugs)   Current Outpatient Medications (Other)  Medication Sig   Ascorbic Acid (VITAMIN C) 1000 MG tablet Take 1,000 mg by mouth daily.   b complex vitamins tablet Take 1 tablet by mouth daily.   calcium-vitamin D (OSCAL WITH D) 500-200 MG-UNIT tablet Take 1 tablet by mouth.   Coenzyme Q10 100 MG capsule Take 100 mg by mouth.    magnesium 30 MG tablet Take 30 mg by mouth 2 (two) times daily.   MILK THISTLE PO Take by mouth.   Multiple Vitamin (MULTIVITAMIN) capsule Take by mouth.   Omega-3 Fatty Acids (FISH OIL) 1000 MG CAPS Take by mouth.   No current facility-administered medications for this visit. (Other)      REVIEW OF SYSTEMS:    ALLERGIES No Known Allergies  PAST  MEDICAL HISTORY Past Medical History:  Diagnosis Date   Basal cell carcinoma 05/19/2017   behind left ear [id number]   History of retinal detachment    Hyperlipidemia    Prostate cancer [facility])    Past Surgical History:  Procedure Laterality Date   BASAL CELL CARCINOMA EXCISION  2019   COLONOSCOPY  2013   INGUINAL HERNIA REPAIR Left 11/04/2019   Procedure: LEFT INGUINAL HERNIA REPAIR WITH MESH;  Surgeon: Erroll Luna, MD;  Location: [place];  Service: General;  Laterality: Left;   INSERTION OF MESH Left 11/04/2019   Procedure: INSERTION OF MESH;  Surgeon: Erroll Luna, MD;  Location: [place];  Service: General;  Laterality: Left;   PROSTATECTOMY  2004   RETINAL DETACHMENT SURGERY Right 2015   RETINAL LASER PROCEDURE  2017   SIGMOIDOSCOPY  2000   TOTAL KNEE ARTHROPLASTY Bilateral 2009    FAMILY HISTORY Family History  Problem Relation Age of Onset   COPD Mother    Cancer Father        Lung   Cancer Daughter        Melanoma   Cancer Paternal Uncle        Liver   Colon cancer Neg Hx  Esophageal cancer Neg Hx    Prostate cancer Neg Hx    Rectal cancer Neg Hx     SOCIAL HISTORY Social History   Tobacco Use   Smoking status: Never   Smokeless tobacco: Never  Vaping Use   Vaping Use: Never used  Substance Use Topics   Alcohol use: Yes    Comment: Beer per day   Drug use: Never         OPHTHALMIC EXAM:  Base Eye Exam     Visual Acuity (ETDRS)       Right Left   Dist [place] HM 20/30 +2   Dist ph [place] NI NI         Tonometry (Tonopen, 8:01 AM)       Right Left   Pressure 12 11         Pupils       Pupils Dark Light Shape React APD   Right PERRL 3 2 Round Brisk +1   Left PERRL 4 3 Round Brisk None         Visual Fields (Counting fingers)       Left Right    Full Full         Extraocular Movement       Right Left    Full Full         Neuro/Psych     Oriented x3: Yes   Mood/Affect: Normal          Dilation     Both eyes: 1.0% Mydriacyl, 2.5% Phenylephrine @ 8:01 AM           Slit Lamp and Fundus Exam     External Exam       Right Left   External Normal Normal         Slit Lamp Exam       Right Left   Lids/Lashes Normal Normal   Conjunctiva/Sclera White and quiet White and quiet   Cornea Clear Clear   Anterior Chamber Deep and quiet Deep and quiet   Iris Round and reactive Round and reactive   Lens Centered posterior chamber intraocular lens Centered posterior chamber intraocular lens   Anterior Vitreous Normal Normal         Fundus Exam       Right Left   Posterior Vitreous Clear, Posterior vitreous detachment, Central vitreous floaters Posterior vitreous detachment, Central vitreous floaters   Disc 4+ Optic disc atrophy, 4+ Pallor Normal   C/D Ratio 0.95 0.55   Macula Normal Epiretinal membrane mild topographic distortion   Vessels Normal Normal   Periphery Scleral buckle nearly 360, and 8/10. Iattice  at 12 & 6 degeneration inferotemporal and supranasal, good laser photocoagulation from previous holes.            IMAGING AND PROCEDURES  Imaging and Procedures for 11/28/20  OCT, Retina - OU - Both Eyes       Right Eye Scan locations included subfoveal. Central Foveal Thickness: 208. Findings include abnormal foveal contour, outer retinal atrophy, central retinal atrophy.   Left Eye Quality was good. Scan locations included subfoveal. Central Foveal Thickness: 307. Findings include epiretinal membrane, abnormal foveal contour.   Notes OS, nondistorting epiretinal membrane, minor overall observe       Color Fundus Photography Optos - OU - Both Eyes       Right Eye Progression has been stable. Disc findings include increased cup to disc ratio, pallor. Vessels : normal observations. Periphery : normal observations.  Left Eye Progression has been stable. Disc findings include normal observations, increased cup to disc ratio.  Macula : epiretinal membrane. Vessels : normal observations. Periphery : lattice.   Notes OD clear media, PVD, good scleral buckle peripherally, no new breaks  OS with lattice degeneration superiorly at the 12 o'clock position good retinopexy as well as 6 o'clock position.  No new breaks.             ASSESSMENT/PLAN:  No problem-specific Assessment & Plan notes found for this encounter.      ICD-10-CM   1. Left retinal lattice degeneration  H35.412 OCT, Retina - OU - Both Eyes    Color Fundus Photography Optos - OU - Both Eyes    2. Macular pucker, left eye  H35.372       1.  OS stable, with minor epiretinal membrane.  Peripheral retinal breaks well treated left eye, no new retinal breaks detected.  2.  OD retina attached, vision limited by diffuse optic atrophy leading to current visual acuity which is stable.  No new breaks.  3.  Ophthalmic Meds Ordered this visit:  No orders of the defined types were placed in this encounter.      No follow-ups on file.  There are no Patient Instructions on file for this visit.   Explained the diagnoses, plan, and follow up with the patient and they expressed understanding.  Patient expressed understanding of the importance of proper follow up care.   Clent Demark Jaysten Essner M.D. Diseases & Surgery of the Retina and Vitreous [facility] [place] 11/28/20     Abbreviations: M myopia (nearsighted); A astigmatism; H hyperopia (farsighted); P presbyopia; Mrx spectacle prescription;  CTL contact lenses; OD right eye; OS left eye; OU both eyes  XT exotropia; ET esotropia; PEK punctate epithelial keratitis; PEE punctate epithelial erosions; DES dry eye syndrome; MGD meibomian gland dysfunction; ATs artificial tears; PFAT's preservative free artificial tears; [place] nuclear sclerotic cataract; PSC posterior subcapsular cataract; ERM epi-retinal membrane; PVD posterior vitreous detachment; RD retinal detachment; DM diabetes mellitus; DR  diabetic retinopathy; NPDR non-proliferative diabetic retinopathy; PDR proliferative diabetic retinopathy; CSME clinically significant macular edema; DME diabetic macular edema; dbh dot blot hemorrhages; CWS cotton wool spot; POAG primary open angle glaucoma; C/D cup-to-disc ratio; HVF humphrey visual field; GVF goldmann visual field; OCT optical coherence tomography; IOP intraocular pressure; BRVO Branch retinal vein occlusion; CRVO central retinal vein occlusion; CRAO central retinal artery occlusion; BRAO branch retinal artery occlusion; RT retinal tear; SB scleral buckle; PPV pars plana vitrectomy; VH Vitreous hemorrhage; PRP panretinal laser photocoagulation; IVK intravitreal kenalog; VMT vitreomacular traction; MH Macular hole;  NVD neovascularization of the disc; NVE neovascularization elsewhere; AREDS age related eye disease study; ARMD age related macular degeneration; POAG primary open angle glaucoma; EBMD epithelial/anterior basement membrane dystrophy; ACIOL anterior chamber intraocular lens; IOL intraocular lens; PCIOL posterior chamber intraocular lens; Phaco/IOL phacoemulsification with intraocular lens placement; [place] photorefractive keratectomy; LASIK laser assisted in situ keratomileusis; HTN hypertension; DM diabetes mellitus; COPD chronic obstructive pulmonary disease

## 2020-12-01 ENCOUNTER — Encounter: Payer: Self-pay | Admitting: Family Medicine

## 2020-12-04 ENCOUNTER — Other Ambulatory Visit: Payer: Self-pay

## 2020-12-04 ENCOUNTER — Ambulatory Visit (INDEPENDENT_AMBULATORY_CARE_PROVIDER_SITE_OTHER): Payer: Medicare Other | Admitting: Dermatology

## 2020-12-04 ENCOUNTER — Encounter: Payer: Self-pay | Admitting: Dermatology

## 2020-12-04 DIAGNOSIS — Z1283 Encounter for screening for malignant neoplasm of skin: Secondary | ICD-10-CM | POA: Diagnosis not present

## 2020-12-04 DIAGNOSIS — L570 Actinic keratosis: Secondary | ICD-10-CM

## 2020-12-17 ENCOUNTER — Encounter: Payer: Self-pay | Admitting: Family Medicine

## 2020-12-18 ENCOUNTER — Encounter: Payer: Self-pay | Admitting: Dermatology

## 2020-12-18 NOTE — Progress Notes (Signed)
   Follow-Up Visit   Subjective  Bryan Cowan is a 70 y.o. male who presents for the following: Annual Exam (No new concerns).  General skin examination, crusty spots hands Location:  Duration:  Quality:  Associated Signs/Symptoms: Modifying Factors:  Severity:  Timing: Context:   Objective  Well appearing patient in no apparent distress; mood and affect are within normal limits. Torso - Posterior (Back) Waist up skin examination: No atypical pigmented lesions or nonmelanoma skin cancer.  Left Dorsal Hand Multiple small gritty and hornlike pink crusts    All skin waist up examined.   Assessment & Plan    Encounter for screening for malignant neoplasm of skin Torso - Posterior (Back)  Annual skin examination.  Continue ultraviolet protection.  AK (actinic keratosis) Left Dorsal Hand  Tolak Cream this winter, goal of 28 total applications.  1 thick lesion treated with LN2 freeze.  Destruction of lesion - Left Dorsal Hand Complexity: simple   Destruction method: cryotherapy   Informed consent: discussed and consent obtained   Timeout:  patient name, date of birth, surgical site, and procedure verified Lesion destroyed using liquid nitrogen: Yes   Cryotherapy cycles:  3 Outcome: patient tolerated procedure well with no complications        I, Lavonna Monarch, MD, have reviewed all documentation for this visit.  The documentation on 12/18/20 for the exam, diagnosis, procedures, and orders are all accurate and complete.

## 2020-12-20 ENCOUNTER — Encounter: Payer: Self-pay | Admitting: Pulmonary Disease

## 2020-12-20 ENCOUNTER — Ambulatory Visit (INDEPENDENT_AMBULATORY_CARE_PROVIDER_SITE_OTHER): Payer: Medicare Other | Admitting: Pulmonary Disease

## 2020-12-20 ENCOUNTER — Other Ambulatory Visit: Payer: Self-pay

## 2020-12-20 VITALS — BP 132/78 | HR 80 | Ht 73.0 in | Wt 227.6 lb

## 2020-12-20 DIAGNOSIS — K219 Gastro-esophageal reflux disease without esophagitis: Secondary | ICD-10-CM

## 2020-12-20 DIAGNOSIS — R0602 Shortness of breath: Secondary | ICD-10-CM

## 2020-12-20 DIAGNOSIS — R059 Cough, unspecified: Secondary | ICD-10-CM

## 2020-12-20 DIAGNOSIS — J40 Bronchitis, not specified as acute or chronic: Secondary | ICD-10-CM

## 2020-12-20 MED ORDER — ADVAIR HFA 115-21 MCG/ACT IN AERO: 2.0000 | INHALATION_SPRAY | Freq: Two times a day (BID) | RESPIRATORY_TRACT | 6 refills | Status: DC

## 2020-12-20 MED ORDER — PANTOPRAZOLE SODIUM 40 MG PO TBEC: 40.0000 mg | DELAYED_RELEASE_TABLET | Freq: Every day | ORAL | 6 refills | Status: DC

## 2020-12-20 NOTE — Patient Instructions (Addendum)
Start advair inhaler 2 puffs twice daily - rinse mouth out after each use  Start pantoprazole 40mg  daily 30 minutes before breakfast for GERD  Recommend eating 2 hours before bedtime  Recommend sleeping with the head of your bed elevated or a trial period of sleeping in your recliner over the next month to reduce night time reflux symptoms  We will schedule you for pulmonary function tests at your earliest convenience  We will schedule you for CT chest scan in the next week or two  Follow up in 6 weeks

## 2020-12-20 NOTE — Progress Notes (Signed)
Synopsis: Referred in November 2022 for abnormal chest x-ray by Arlester Marker, MD  Subjective:   PATIENT ID: Bryan Cowan GENDER: male DOB: May 01, 1950, MRN: [id number]  HPI  Chief Complaint  Patient presents with   Consult    Productive cough for 3-6 months, SOB. PCP referred after CXR   Bryan Cowan is a 70 year old man, never smoker with history of prostate cancer and hyperlipidemia who is referred to pulmonary clinic for abnormal chest x-ray.  Patient reports developing shortness of breath, cough with clear sputum production and intermittent wheezing over the past 3 to 6 months.  He had a chest radiograph on 11/27/2020 which showed calcified granuloma of the right upper lobe and calcified right hilar lymph node along with scattered peribronchial thickening.  He is a never smoker but reports being exposed to secondhand smoke exposure from both his parents in childhood.  In the past he has had issues with seasonal allergies but over recent years denies any symptoms from seasonal allergies.  He does report reflux symptoms on a regular basis.  His wife has asked if the timolol eyedrops that he was started on 6 months ago could be contributing to his respiratory symptoms.  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  He denies any significant dust or chemical exposures over the years.  Past Medical History:  Diagnosis Date   Basal cell carcinoma 05/19/2017   behind left ear cx3 54fu   History of retinal detachment    Hyperlipidemia    Prostate cancer ([place])      Family History  Problem Relation Age of Onset   COPD Mother    Cancer Father        Lung   Cancer Daughter        Melanoma   Cancer Paternal Uncle        Liver   Colon cancer Neg Hx    Esophageal cancer Neg Hx    Prostate cancer Neg Hx    Rectal cancer Neg Hx      Social History   Socioeconomic History   Marital status: Married    Spouse name: Not on file   Number of  children: 3   Years of education: Not on file   Highest education level: Not on file  Occupational History   Occupation: Retired- HR Management  Tobacco Use   Smoking status: Never   Smokeless tobacco: Never  Vaping Use   Vaping Use: Never used  Substance and Sexual Activity   Alcohol use: Yes    Comment: Beer per day   Drug use: Never   Sexual activity: Yes  Other Topics Concern   Not on file  Social History Narrative   Moved here from [place], [place] in 05/2015.   Social Determinants of Health   Financial Resource Strain: Low Risk    Difficulty of Paying Living Expenses: Not hard at all  Food Insecurity: No Food Insecurity   Worried About Charity fundraiser in the Last Year: Never true   Eugenio Saenz in the Last Year: Never true  Transportation Needs: No Transportation Needs   Lack of Transportation (Medical): No   Lack of Transportation (Non-Medical): No  Physical Activity: Sufficiently Active   Days of Exercise per Week: 5 days   Minutes of Exercise per Session: 90 min  Stress: No Stress Concern Present   Feeling of Stress : Not at all  Social Connections:  Moderately Isolated   Frequency of Communication with Friends and Family: More than three times a week   Frequency of Social Gatherings with Friends and Family: More than three times a week   Attends Religious Services: Never   Marine scientist or Organizations: No   Attends Music therapist: Never   Marital Status: Married  Human resources officer Violence: Not At Risk   Fear of Current or Ex-Partner: No   Emotionally Abused: No   Physically Abused: No   Sexually Abused: No     No Known Allergies   Outpatient Medications Prior to Visit  Medication Sig Dispense Refill   Ascorbic Acid (VITAMIN C) 1000 MG tablet Take 1,000 mg by mouth daily.     b complex vitamins tablet Take 1 tablet by mouth daily.     brimonidine (ALPHAGAN) 0.2 % ophthalmic solution Place 1 drop into the right eye 2 (two)  times daily.     calcium-vitamin D (OSCAL WITH D) 500-200 MG-UNIT tablet Take 1 tablet by mouth.     Coenzyme Q10 100 MG capsule Take 100 mg by mouth.      magnesium 30 MG tablet Take 30 mg by mouth 2 (two) times daily.     MILK THISTLE PO Take by mouth.     Multiple Vitamin (MULTIVITAMIN) capsule Take by mouth.     Omega-3 Fatty Acids (FISH OIL) 1000 MG CAPS Take by mouth.     timolol (TIMOPTIC) 0.5 % ophthalmic solution timolol maleate 0.5 % eye drops  PLACE 1 DROP INTO BOTH EYES TWICE DAILY     No facility-administered medications prior to visit.    Review of Systems  Constitutional:  Negative for chills, fever, malaise/fatigue and weight loss.  HENT:  Negative for congestion, sinus pain and sore throat.   Eyes: Negative.   Respiratory:  Positive for cough, shortness of breath and wheezing. Negative for hemoptysis and sputum production.   Cardiovascular:  Negative for chest pain, palpitations, orthopnea, claudication and leg swelling.  Gastrointestinal:  Positive for heartburn. Negative for abdominal pain, nausea and vomiting.  Genitourinary: Negative.   Musculoskeletal:  Negative for joint pain and myalgias.  Skin:  Negative for rash.  Neurological:  Negative for weakness.  Endo/Heme/Allergies: Negative.   Psychiatric/Behavioral: Negative.     Objective:   Vitals:   12/20/20 1354  BP: 132/78  Pulse: 80  SpO2: 96%  Weight: 227 lb 9.6 oz (103.2 kg)  Height: 6\' 1"  (1.854 m)   Physical Exam Constitutional:      General: He is not in acute distress. HENT:     Head: Normocephalic and atraumatic.  Eyes:     Extraocular Movements: Extraocular movements intact.     Conjunctiva/sclera: Conjunctivae normal.     Pupils: Pupils are equal, round, and reactive to light.  Cardiovascular:     Rate and Rhythm: Normal rate and regular rhythm.     Pulses: Normal pulses.     Heart sounds: Normal heart sounds. No murmur heard. Abdominal:     General: Bowel sounds are normal.      Palpations: Abdomen is soft.  Musculoskeletal:     Right lower leg: No edema.     Left lower leg: No edema.  Lymphadenopathy:     Cervical: No cervical adenopathy.  Skin:    General: Skin is warm and dry.  Neurological:     General: No focal deficit present.     Mental Status: He is alert.  Psychiatric:  Mood and Affect: Mood normal.        Behavior: Behavior normal.        Thought Content: Thought content normal.        Judgment: Judgment normal.   CBC    Component Value Date/Time   WBC 6.0 11/01/2019 0939   RBC 5.19 11/01/2019 0939   HGB 13.8 11/01/2019 0939   HCT 43.5 11/01/2019 0939   PLT 216 11/01/2019 0939   MCV 83.8 11/01/2019 0939   MCH 26.6 11/01/2019 0939   MCHC 31.7 11/01/2019 0939   RDW 13.5 11/01/2019 0939   LYMPHSABS 2.0 11/01/2019 0939   MONOABS 0.6 11/01/2019 0939   EOSABS 0.5 11/01/2019 0939   BASOSABS 0.1 11/01/2019 0939   BMP Latest Ref Rng & Units 11/01/2019 09/09/2019 12/02/2018  Glucose 70 - 99 mg/dL 102(H) 101(H) 99  BUN 8 - 23 mg/dL 10 14 11   Creatinine 0.61 - 1.24 mg/dL 0.75 0.72 0.66  Sodium 135 - 145 mmol/L 138 137 137  Potassium 3.5 - 5.1 mmol/L 4.4 4.4 3.8  Chloride 98 - 111 mmol/L 105 103 103  CO2 22 - 32 mmol/L 25 28 25   Calcium 8.9 - 10.3 mg/dL 8.9 9.9 9.2   Chest imaging: CXR 11/27/2020 1. Mild diffuse peribronchial cuffing which could be seen in the setting of acute or chronic bronchitis. 2. Old granulomatous disease, as above. 3. Aortic atherosclerosis.  PFT: No flowsheet data found.  Labs:  Path:  Echo:  Heart Catheterization:  Assessment & Plan:   Bronchitis - Plan: fluticasone-salmeterol (ADVAIR HFA) 115-21 MCG/ACT inhaler  Cough, unspecified type - Plan: Pulmonary Function Test, fluticasone-salmeterol (ADVAIR HFA) 115-21 MCG/ACT inhaler  Shortness of breath - Plan: CT Chest High Resolution, Pulmonary Function Test  Gastroesophageal reflux disease without esophagitis - Plan: pantoprazole (PROTONIX) 40 MG  tablet  Discussion: Rae Sutcliffe is a 70 year old man, never smoker with history of prostate cancer and hyperlipidemia who is referred to pulmonary clinic for abnormal chest x-ray.  His chest radiograph shows a calcified granuloma and a calcified right hilar lymph node likely from a previous fungal [place]   The diffuse peribronchial cuffing along with his symptoms of dyspnea, cough and intermittent wheezing over the past 3-6 months are concerning for bronchitis. The etiology of why he would develop bronchitis is possibly due to underlying reflux disease vs allergies vs side effect of the timolol eye drops.  He is to start advair 115-43mcg 2 puffs twice daily for bronchitis and pantoprazole 40mg  daily for GERD. Also recommended elevated the head of the bed at night when sleeping and eating 2 hours prior to bedtime to avoid nocturnal reflux symptoms.  We will check pulmonary function tests and a HRCT chest for further evaluation.  Follow up in 6 weeks.  Freda Jackson, MD [facility] Office: [phone]    Current Outpatient Medications:    Ascorbic Acid (VITAMIN C) 1000 MG tablet, Take 1,000 mg by mouth daily., Disp: , Rfl:    b complex vitamins tablet, Take 1 tablet by mouth daily., Disp: , Rfl:    brimonidine (ALPHAGAN) 0.2 % ophthalmic solution, Place 1 drop into the right eye 2 (two) times daily., Disp: , Rfl:    calcium-vitamin D (OSCAL WITH D) 500-200 MG-UNIT tablet, Take 1 tablet by mouth., Disp: , Rfl:    Coenzyme Q10 100 MG capsule, Take 100 mg by mouth. , Disp: , Rfl:    fluticasone-salmeterol (ADVAIR HFA) 115-21 MCG/ACT  inhaler, Inhale 2 puffs into the lungs 2 (two) times daily., Disp: 1 each, Rfl: 6   magnesium 30 MG tablet, Take 30 mg by mouth 2 (two) times daily., Disp: , Rfl:    MILK THISTLE PO, Take by mouth., Disp: , Rfl:    Multiple Vitamin (MULTIVITAMIN) capsule, Take by mouth., Disp: , Rfl:    Omega-3  Fatty Acids (FISH OIL) 1000 MG CAPS, Take by mouth., Disp: , Rfl:    pantoprazole (PROTONIX) 40 MG tablet, Take 1 tablet (40 mg total) by mouth daily., Disp: 30 tablet, Rfl: 6   timolol (TIMOPTIC) 0.5 % ophthalmic solution, timolol maleate 0.5 % eye drops  PLACE 1 DROP INTO BOTH EYES TWICE DAILY, Disp: , Rfl:

## 2020-12-21 ENCOUNTER — Telehealth: Payer: Self-pay | Admitting: Pulmonary Disease

## 2020-12-22 ENCOUNTER — Other Ambulatory Visit (HOSPITAL_COMMUNITY): Payer: Self-pay

## 2020-12-22 NOTE — Telephone Encounter (Signed)
Will send message over to the pharmacy to see if they can help with cost of meds.  Pt stated that his advair is too expensive.  Is there another cheaper alternative?  thanks

## 2020-12-22 NOTE — Telephone Encounter (Signed)
Please run test claims for the following: Advair Diskus Advair HFA Dulera Symbicort AirDuo Respiclick Breo Ellipta  Routing to [employer] team for test claims and they will route back to triage Triage - If all are unaffordable (which may be case since he has Medicare), patient will need to pursue patient assistance  Knox Saliva, PharmD, MPH, BCPS Clinical Pharmacist (Rheumatology and Pulmonology)

## 2020-12-22 NOTE — Telephone Encounter (Signed)
Pt still has deductible to meet ($327.38) which why the cost is higher than expected. Dulera- not on formulary Symbicort- 105.28 (co-pay would be $43 if deductible was met) Airduo-  not on formulary Breo- 105.28 (co-pay would be $43 if deductible was met)  Patient will have to pursue patient assistance

## 2020-12-22 NOTE — Telephone Encounter (Signed)
Spoke to patient and relayed below message.  Patient stated that he would like to think this over and call back with update.

## 2020-12-22 NOTE — Telephone Encounter (Signed)
Will await response from PA team.

## 2020-12-27 NOTE — Telephone Encounter (Signed)
JD please advise on message from pt.  thanks

## 2020-12-28 ENCOUNTER — Encounter: Payer: Self-pay | Admitting: Interventional Cardiology

## 2020-12-28 ENCOUNTER — Ambulatory Visit (INDEPENDENT_AMBULATORY_CARE_PROVIDER_SITE_OTHER): Payer: Medicare Other | Admitting: Interventional Cardiology

## 2020-12-28 ENCOUNTER — Other Ambulatory Visit: Payer: Self-pay

## 2020-12-28 VITALS — BP 142/72 | HR 91 | Ht 73.0 in | Wt 228.2 lb

## 2020-12-28 DIAGNOSIS — I700 Atherosclerosis of aorta: Secondary | ICD-10-CM | POA: Diagnosis not present

## 2020-12-28 DIAGNOSIS — R0600 Dyspnea, unspecified: Secondary | ICD-10-CM | POA: Diagnosis not present

## 2020-12-28 DIAGNOSIS — E782 Mixed hyperlipidemia: Secondary | ICD-10-CM | POA: Diagnosis not present

## 2020-12-28 DIAGNOSIS — R072 Precordial pain: Secondary | ICD-10-CM | POA: Diagnosis not present

## 2020-12-28 NOTE — Telephone Encounter (Signed)
JD  please advise. Thanks  Pt would like to wait until the first of the year when he changes his insurance over to a different plan.  He is wondering if waiting the 6 more weeks to start an inhaler will cause his condition to worsen?  Please advise.  thanks

## 2020-12-28 NOTE — Progress Notes (Signed)
Cardiology Office Note   Date:  12/28/2020   ID:  Bryan Cowan, DOB 1950/04/11, MRN [id number]  PCP:  Haydee Salter, MD    No chief complaint on file.  DOE  [facility] Readings from Last 3 Encounters:  12/28/20 228 lb 3.2 oz (103.5 kg)  12/20/20 227 lb 9.6 oz (103.2 kg)  11/27/20 223 lb 6.4 oz (101.3 kg)       History of Present Illness: Bryan Cowan is a 70 y.o. male who is being seen today for the evaluation of DOE at the request of Haydee Salter, MD.   He remains active exercising several times a week.  He has noted some DOE and dizziness with exertion over the past few months.  Worse when walking up a hill or with lifting weights on occasion.    Exercised yesterday, walks dog a few times a day.   He has had high cholesterol.  He has avoided a statin.   Denies : exertional Chest pain. Dizziness. Leg edema. Nitroglycerin use. Orthopnea. Palpitations. Paroxysmal nocturnal dyspnea. Shortness of breath. Syncope.    Past Medical History:  Diagnosis Date   Basal cell carcinoma 05/19/2017   behind left ear cx3 65fu   History of retinal detachment    Hyperlipidemia    Prostate cancer [facility])     Past Surgical History:  Procedure Laterality Date   BASAL CELL CARCINOMA EXCISION  2019   COLONOSCOPY  2013   INGUINAL HERNIA REPAIR Left 11/04/2019   Procedure: LEFT INGUINAL HERNIA REPAIR WITH MESH;  Surgeon: Erroll Luna, MD;  Location: [facility];  Service: General;  Laterality: Left;   INSERTION OF MESH Left 11/04/2019   Procedure: INSERTION OF MESH;  Surgeon: Erroll Luna, MD;  Location: [facility];  Service: General;  Laterality: Left;   PROSTATECTOMY  2004   RETINAL DETACHMENT SURGERY Right 2015   RETINAL LASER PROCEDURE  2017   SIGMOIDOSCOPY  2000   TOTAL KNEE ARTHROPLASTY Bilateral 2009     Current Outpatient Medications  Medication Sig Dispense Refill   Ascorbic Acid (VITAMIN C) 1000 MG tablet Take 1,000 mg by mouth daily.     b  complex vitamins tablet Take 1 tablet by mouth daily.     brimonidine (ALPHAGAN) 0.2 % ophthalmic solution Place 1 drop into the right eye 2 (two) times daily.     calcium-vitamin D (OSCAL WITH D) 500-200 MG-UNIT tablet Take 1 tablet by mouth.     Coenzyme Q10 100 MG capsule Take 100 mg by mouth.      magnesium 30 MG tablet Take 30 mg by mouth 2 (two) times daily.     MILK THISTLE PO Take by mouth.     Multiple Vitamin (MULTIVITAMIN) capsule Take by mouth.     Omega-3 Fatty Acids (FISH OIL) 1000 MG CAPS Take by mouth.     pantoprazole (PROTONIX) 40 MG tablet Take 1 tablet (40 mg total) by mouth daily. 30 tablet 6   timolol (TIMOPTIC) 0.5 % ophthalmic solution timolol maleate 0.5 % eye drops  PLACE 1 DROP INTO BOTH EYES TWICE DAILY     fluticasone-salmeterol (ADVAIR HFA) 115-21 MCG/ACT inhaler Inhale 2 puffs into the lungs 2 (two) times daily. 1 each 6   No current facility-administered medications for this visit.    Allergies:   Patient has no known allergies.    Social History:  The patient  reports that he has never smoked. He has never used smokeless tobacco. He  reports current alcohol use. He reports that he does not use drugs.   Family History:  The patient's family history includes COPD in his mother; Cancer in his daughter, father, and paternal uncle.    ROS:  Please see the history of present illness.   Otherwise, review of systems are positive for DOE.   All other systems are reviewed and negative.    PHYSICAL EXAM: VS:  BP (!) 142/72   Pulse 91   Ht 6\' 1"  (1.854 m)   Wt 228 lb 3.2 oz (103.5 kg)   SpO2 93%   BMI 30.11 kg/m  , BMI Body mass index is 30.11 kg/m. GEN: Well nourished, well developed, in no acute distress HEENT: normal Neck: no JVD, carotid bruits, or masses Cardiac: RRR; no murmurs, rubs, or gallops,no edema  Respiratory:  clear to auscultation bilaterally, normal work of breathing GI: soft, nontender, nondistended, + BS MS: no deformity or atrophy , 2+  DP pulses bilaterally Skin: warm and dry, no rash Neuro:  Strength and sensation are intact Psych: euthymic mood, full affect   EKG:   The ekg ordered today demonstrates NSR, PAC, PVC, no ST changes   Recent Labs: No results found for requested labs within last 8760 hours.   Lipid Panel    Component Value Date/Time   CHOL 240 (H) 11/27/2020 1017   TRIG 100.0 11/27/2020 1017   HDL 53.30 11/27/2020 1017   CHOLHDL 4 11/27/2020 1017   VLDL 20.0 11/27/2020 1017   LDLCALC 166 (H) 11/27/2020 1017     Other studies Reviewed: Additional studies/ records that were reviewed today with results demonstrating: LDL 166; .   ASSESSMENT AND PLAN:  DOE: Unclear etiology.  No obvious volume overload.  Plan for echocardiogram to rule out cardiac source.  In addition, he has had longstanding hyperlipidemia.  Now with dyspnea on exertion and dizziness on exertion, need to rule out coronary artery disease he had an episode of chest tightness as well.  Plan for coronary CTA.  We will see if the lung findings from a coronary CTA would be sufficient for his pulmonologist, Dr. Raeanne Barry.  If so, can combine the 2 studies.  If not, will have to plan for 2 separate CT scans. Aortic atherosclerosis: LDL 166.  He was hesitant to take a statin.  This is an indication for statin.  If he does have a high calcium score, he would be willing.  Await coronary CT result. Hyperlipidemia: Continue regular exercise.  Would benefit from statin.  Await further testing as noted above. BP usually in the 120/70 range.      Current medicines are reviewed at length with the patient today.  The patient concerns regarding his medicines were addressed.  The following changes have been made:  No change  Labs/ tests ordered today include:  No orders of the defined types were placed in this encounter.   Recommend 150 minutes/week of aerobic exercise Low fat, low carb, high fiber diet recommended  Disposition:   FU for  testing   Signed, Larae Grooms, MD  12/28/2020 1:40 PM    [facility] [address], [place], [place]  [address] Phone: [phone]; Fax: [phone]

## 2020-12-28 NOTE — Patient Instructions (Signed)
Medication Instructions:  Your physician recommends that you continue on your current medications as directed. Please refer to the Current Medication list given to you today.  *If you need a refill on your cardiac medications before your next appointment, please call your pharmacy*   Lab Work: none If you have labs (blood work) drawn today and your tests are completely normal, you will receive your results only by: [place] (if you have MyChart) OR A paper copy in the mail If you have any lab test that is abnormal or we need to change your treatment, we will call you to review the results.   Testing/Procedures: Your physician has requested that you have an echocardiogram. Echocardiography is a painless test that uses sound waves to create images of your heart. It provides your doctor with information about the size and shape of your heart and how well your heart's chambers and valves are working. This procedure takes approximately one hour. There are no restrictions for this procedure.    Follow-Up: At [facility], you and your health needs are our priority.  As part of our continuing mission to provide you with exceptional heart care, we have created designated Provider Care Teams.  These Care Teams include your primary Cardiologist (physician) and Advanced Practice Providers (APPs -  Physician Assistants and Nurse Practitioners) who all work together to provide you with the care you need, when you need it.  We recommend signing up for the patient portal called "MyChart".  Sign up information is provided on this After Visit Summary.  MyChart is used to connect with patients for Virtual Visits (Telemedicine).  Patients are able to view lab/test results, encounter notes, upcoming appointments, etc.  Non-urgent messages can be sent to your provider as well.   To learn more about what you can do with MyChart, go to [email or web address].    Your next appointment:   Based on test  results  The format for your next appointment:   In Person  Provider:   Larae Grooms, MD     Other Instructions

## 2021-01-05 ENCOUNTER — Encounter: Payer: Self-pay | Admitting: *Deleted

## 2021-01-05 ENCOUNTER — Ambulatory Visit: Payer: Self-pay | Admitting: Surgery

## 2021-01-05 ENCOUNTER — Telehealth: Payer: Self-pay | Admitting: *Deleted

## 2021-01-05 DIAGNOSIS — R0600 Dyspnea, unspecified: Secondary | ICD-10-CM

## 2021-01-05 DIAGNOSIS — K4090 Unilateral inguinal hernia, without obstruction or gangrene, not specified as recurrent: Secondary | ICD-10-CM | POA: Diagnosis not present

## 2021-01-05 DIAGNOSIS — R072 Precordial pain: Secondary | ICD-10-CM

## 2021-01-05 MED ORDER — METOPROLOL TARTRATE 100 MG PO TABS: ORAL_TABLET | ORAL | 0 refills | Status: DC

## 2021-01-05 NOTE — Telephone Encounter (Signed)
Bryan Cowan is returning Bryan Cowan's call.

## 2021-01-05 NOTE — Telephone Encounter (Signed)
Left message to call office.  Will need BMP prior to Coronary CTA

## 2021-01-05 NOTE — Telephone Encounter (Signed)
-----   Message from Jettie Booze, MD sent at 01/03/2021 10:40 AM EST ----- Clarita Crane.  Fraser Din, We can order separate coronary CTA.  Thanks.  JV ----- Message ----- From: Freddi Starr, MD Sent: 01/02/2021   3:33 PM EST To: Jettie Booze, MD, #  He will need two separate CT protocols. I have ordered a high resolution CT chest while the CTA coronary scan does not get the lung apices or bases sometimes.   They could be done on the same day though unless that is too much radiation per radiology protocol.  Jon   ----- Message ----- From: Jettie Booze, MD Sent: 12/28/2020   2:31 PM EST To: Thompson Grayer, RN, Freddi Starr, MD  Roderic Palau, need to rule out coronary artery disease.  Wanted to order coronary CTA.  He has lung CT scheduled for November 22.  Wondering if the lung findings from a coronary CTA would be sufficient for your purposes.  If so, can combine the 2 studies.  If not, will have to plan for 2 separate CT scans.  Please let me know.  Thanks.

## 2021-01-05 NOTE — Telephone Encounter (Signed)
Patient notified. He would like to proceed with Coronary CTA.  I verbally went over all instructions with patient and will send copy to him through my chart.  Prescription for Lopressor 100 mg prior to CTA sent to [facility] on [address].  Patient will have BMP done when in office for echo on 11/30.  He is aware he will be called to come in sooner for BMP if CTA scheduled before 11/30.

## 2021-01-09 ENCOUNTER — Ambulatory Visit
Admission: RE | Admit: 2021-01-09 | Discharge: 2021-01-09 | Disposition: A | Discharge status: Home / Self Care | Payer: Medicare Other | Source: Ambulatory Visit | Attending: Pulmonary Disease | Admitting: Pulmonary Disease

## 2021-01-09 DIAGNOSIS — R0602 Shortness of breath: Secondary | ICD-10-CM | POA: Diagnosis not present

## 2021-01-09 DIAGNOSIS — I898 Other specified noninfective disorders of lymphatic vessels and lymph nodes: Secondary | ICD-10-CM | POA: Diagnosis not present

## 2021-01-09 DIAGNOSIS — K449 Diaphragmatic hernia without obstruction or gangrene: Secondary | ICD-10-CM | POA: Diagnosis not present

## 2021-01-09 DIAGNOSIS — I2510 Atherosclerotic heart disease of native coronary artery without angina pectoris: Secondary | ICD-10-CM | POA: Diagnosis not present

## 2021-01-17 ENCOUNTER — Other Ambulatory Visit: Payer: Medicare Other | Admitting: *Deleted

## 2021-01-17 ENCOUNTER — Other Ambulatory Visit: Payer: Self-pay

## 2021-01-17 ENCOUNTER — Ambulatory Visit (HOSPITAL_COMMUNITY): Payer: Medicare Other | Attending: Cardiology

## 2021-01-17 ENCOUNTER — Telehealth (HOSPITAL_COMMUNITY): Payer: Self-pay | Admitting: Emergency Medicine

## 2021-01-17 DIAGNOSIS — R0600 Dyspnea, unspecified: Secondary | ICD-10-CM | POA: Insufficient documentation

## 2021-01-17 DIAGNOSIS — R072 Precordial pain: Secondary | ICD-10-CM | POA: Diagnosis not present

## 2021-01-17 LAB — BASIC METABOLIC PANEL
BUN/Creatinine Ratio: 15 (ref 10–24)
BUN: 12 mg/dL (ref 8–27)
CO2: 25 mmol/L (ref 20–29)
Calcium: 8.9 mg/dL (ref 8.6–10.2)
Chloride: 102 mmol/L (ref 96–106)
Creatinine, Ser: 0.82 mg/dL (ref 0.76–1.27)
Glucose: 95 mg/dL (ref 70–99)
Potassium: 4.2 mmol/L (ref 3.5–5.2)
Sodium: 138 mmol/L (ref 134–144)
eGFR: 93 mL/min/{1.73_m2} (ref >59)

## 2021-01-17 LAB — ECHOCARDIOGRAM COMPLETE
Area-P 1/2: 4.68 cm2
S' Lateral: 3.8 cm

## 2021-01-17 NOTE — Telephone Encounter (Signed)
Reaching out to patient to offer assistance regarding upcoming cardiac imaging study; pt verbalizes understanding of appt date/time, parking situation and where to check in, pre-test NPO status and medications ordered, and verified current allergies; name and call back number provided for further questions should they arise Marchia Bond RN Navigator Cardiac Imaging [facility] [phone] office [phone] cell  Arrival time 1230 100mg  metoprolol tart Denies iv issues

## 2021-01-19 ENCOUNTER — Ambulatory Visit (HOSPITAL_COMMUNITY)
Admission: RE | Admit: 2021-01-19 | Discharge: 2021-01-19 | Disposition: A | Discharge status: Home / Self Care | Payer: Medicare Other | Source: Ambulatory Visit | Attending: Interventional Cardiology | Admitting: Interventional Cardiology

## 2021-01-19 ENCOUNTER — Ambulatory Visit (HOSPITAL_COMMUNITY)
Admission: RE | Admit: 2021-01-19 | Discharge: 2021-01-19 | Disposition: A | Discharge status: Home / Self Care | Payer: Medicare Other | Source: Ambulatory Visit | Attending: Cardiovascular Disease | Admitting: Cardiovascular Disease

## 2021-01-19 ENCOUNTER — Other Ambulatory Visit (HOSPITAL_BASED_OUTPATIENT_CLINIC_OR_DEPARTMENT_OTHER): Payer: Self-pay | Admitting: Cardiovascular Disease

## 2021-01-19 ENCOUNTER — Other Ambulatory Visit (HOSPITAL_COMMUNITY): Payer: Self-pay | Admitting: *Deleted

## 2021-01-19 ENCOUNTER — Telehealth: Payer: Self-pay | Admitting: *Deleted

## 2021-01-19 ENCOUNTER — Other Ambulatory Visit: Payer: Self-pay

## 2021-01-19 DIAGNOSIS — I2510 Atherosclerotic heart disease of native coronary artery without angina pectoris: Secondary | ICD-10-CM | POA: Diagnosis not present

## 2021-01-19 DIAGNOSIS — R931 Abnormal findings on diagnostic imaging of heart and coronary circulation: Secondary | ICD-10-CM

## 2021-01-19 DIAGNOSIS — I77819 Aortic ectasia, unspecified site: Secondary | ICD-10-CM

## 2021-01-19 DIAGNOSIS — E782 Mixed hyperlipidemia: Secondary | ICD-10-CM

## 2021-01-19 DIAGNOSIS — R072 Precordial pain: Secondary | ICD-10-CM | POA: Insufficient documentation

## 2021-01-19 DIAGNOSIS — I25119 Atherosclerotic heart disease of native coronary artery with unspecified angina pectoris: Secondary | ICD-10-CM

## 2021-01-19 MED ORDER — IOHEXOL 350 MG/ML SOLN
100.0000 mL | Freq: Once | INTRAVENOUS | Status: AC | PRN
  Administered 2021-01-19: 100 mL via INTRAVENOUS

## 2021-01-19 MED ORDER — NITROGLYCERIN 0.4 MG SL SUBL
SUBLINGUAL_TABLET | SUBLINGUAL | Status: AC
  Filled 2021-01-19: qty 2

## 2021-01-19 MED ORDER — NITROGLYCERIN 0.4 MG SL SUBL
0.8000 mg | SUBLINGUAL_TABLET | Freq: Once | SUBLINGUAL | Status: AC
  Administered 2021-01-19: 0.8 mg via SUBLINGUAL

## 2021-01-19 NOTE — Telephone Encounter (Signed)
OK to lift light weights where he is not straining.  WOuld avoid doing a weights where he has to bear down to lift the weight.  OK to do multiple reps of lighter weights where is not straining.

## 2021-01-19 NOTE — Telephone Encounter (Signed)
-----   Message from Jettie Booze, MD sent at 01/18/2021  9:34 PM EST ----- Low normal LV funciton.  Normal RV function and valvular function.  Dilated aorta.  Will need CTA of aorta in 6 months to check for any change.

## 2021-01-19 NOTE — Telephone Encounter (Signed)
I spoke with patient and gave him information from Dr Varanasi 

## 2021-01-19 NOTE — Telephone Encounter (Signed)
I spoke with patient and reviewed results with him.  Order placed for CTA in 6 months to be done at [facility]. I discussed weight lifting with patient. He reports going to the gym frequently where he uses resistance machines.  Will forward to Dr Irish Lack for activity restriction recommendations.

## 2021-01-22 ENCOUNTER — Telehealth: Payer: Self-pay | Admitting: *Deleted

## 2021-01-22 DIAGNOSIS — I2510 Atherosclerotic heart disease of native coronary artery without angina pectoris: Secondary | ICD-10-CM | POA: Diagnosis not present

## 2021-01-22 MED ORDER — ROSUVASTATIN CALCIUM 20 MG PO TABS: 20.0000 mg | ORAL_TABLET | Freq: Every day | ORAL | 3 refills | Status: DC

## 2021-01-22 NOTE — Telephone Encounter (Signed)
Pt is returning a call toTriage

## 2021-01-22 NOTE — Telephone Encounter (Signed)
   Pre-operative Risk Assessment    Patient Name: Bryan Cowan  DOB: February 07, 1951 MRN: [id number]      Request for Surgical Clearance   Procedure:   HERNIA SURGERY  Date of Surgery: Clearance TBD                                 Surgeon:  DR. Erroll Luna Surgeon's Group or Practice Name:  [facility] Phone number:  [phone] Fax number:  [phone] ATTN: Carlene Coria, CMA   Type of Clearance Requested: - Medical    Type of Anesthesia:   General    Additional requests/questions:   Jiles Prows   01/22/2021, 3:18 PM

## 2021-01-22 NOTE — Telephone Encounter (Signed)
Jettie Booze, MD  01/21/2021  8:59 PM EST     Elevated calcium score with at least moderate stenosis in circumflex.  Would start a statin given LDL 166.  Await CT FFR to determine significance of circumflex lesion.  Start rosuvastatin 20 mg daily.  Liver and lipids in 3 months.    The patient has been notified of the result and verbalized understanding.  All questions (if any) were answered. Antonieta Iba, RN 01/22/2021 1:51 PM

## 2021-01-22 NOTE — Addendum Note (Signed)
Addended by: Antonieta Iba on: 01/22/2021 01:52 PM   Modules accepted: Orders

## 2021-01-24 NOTE — Telephone Encounter (Signed)
Dr. Irish Lack,  Looks like this patient has significant, by FFR, stenosis in OM1. We are asked for clearance for hernia surgery. Do you recommend proceeding with surgery, or does he need a heart cath with the understanding that PCI would interrupt scheduled surgery?

## 2021-01-25 NOTE — Telephone Encounter (Signed)
His CT is still relatively low risk.  He is not on medical therapy.  His SHOB could also be from his lung issues.  WOuld try Imdur 30 mg daily if he was still symptomatic before suggesting cardiac cath unless sx  become daily with mildly strenuous activities.    I should probably have an appt to discuss these findings with him. Ok to try the Imdur before then. Virtual appt is fine.  Ultimately, if his hernia is bothering him more than the OM1 lesion. He should be fine to have surgery and treat CAD with meds.   Jettie Booze, MD

## 2021-01-26 ENCOUNTER — Encounter: Payer: Self-pay | Admitting: Interventional Cardiology

## 2021-01-26 ENCOUNTER — Ambulatory Visit (INDEPENDENT_AMBULATORY_CARE_PROVIDER_SITE_OTHER): Payer: Medicare Other | Admitting: Interventional Cardiology

## 2021-01-26 ENCOUNTER — Other Ambulatory Visit: Payer: Self-pay

## 2021-01-26 VITALS — BP 130/70 | HR 88 | Ht 73.0 in | Wt 227.0 lb

## 2021-01-26 DIAGNOSIS — E782 Mixed hyperlipidemia: Secondary | ICD-10-CM

## 2021-01-26 DIAGNOSIS — I25119 Atherosclerotic heart disease of native coronary artery with unspecified angina pectoris: Secondary | ICD-10-CM

## 2021-01-26 DIAGNOSIS — Z01810 Encounter for preprocedural cardiovascular examination: Secondary | ICD-10-CM

## 2021-01-26 DIAGNOSIS — I77819 Aortic ectasia, unspecified site: Secondary | ICD-10-CM | POA: Diagnosis not present

## 2021-01-26 DIAGNOSIS — I2510 Atherosclerotic heart disease of native coronary artery without angina pectoris: Secondary | ICD-10-CM

## 2021-01-26 MED ORDER — NITROGLYCERIN 0.4 MG SL SUBL: 0.4000 mg | SUBLINGUAL_TABLET | SUBLINGUAL | 6 refills | Status: AC | PRN

## 2021-01-26 NOTE — Telephone Encounter (Signed)
No further cardiac testing needed before surgery.

## 2021-01-26 NOTE — Telephone Encounter (Signed)
Pt has appt with DOD Dr. Irish Lack today @ 4:30. Will send notes to MD for upcoming appt today. Will send Dr. Yaakov Guthrie pt has appt today.

## 2021-01-26 NOTE — Progress Notes (Signed)
Cardiology Office Note   Date:  01/26/2021   ID:  Briyan Kleven, DOB 01-20-1951, MRN [id number]  PCP:  Haydee Salter, MD    No chief complaint on file.  CAD  Wt Readings from Last 3 Encounters:  01/26/21 227 lb (103 kg)  12/28/20 228 lb 3.2 oz (103.5 kg)  12/20/20 227 lb 9.6 oz (103.2 kg)       History of Present Illness: Bryan Cowan is a 70 y.o. male who I saw for dyspnea on exertion in November 2022.  He had a coronary CTA showing: "Aortic Valve:  Trileaflet.  Mild calcifications.   Coronary Arteries:  Normal coronary origin.  Right dominance.   RCA is a large dominant artery that gives rise to PDA and PLVB. There is mild (25-49%) calcified plaque in the proximal and mid RCA with minimal (<25%) calcified plaque distally.   Left main is a large artery that gives rise to LAD, RI, and LCX arteries.   LAD is a large vessel that has scattered mild (25-49%) calcified plaque. D1 is a tiny vessel with minimal (<25%) calcified plaque.   RI is a small (1.6 mm) vessel with mild (25-49%) calcified plaque.   LCX is a non-dominant artery that gives rise to a tiny OM1 vessel with mild (25-49%) calcified plaque. There is severe (>70%) mixed plaque in the mid LCX."  CT FFR in the OM1 was significant.  He continues to exercise regularly.  He denies any chest discomfort.  He has chronic cough for which he is seeing pulmonary.  He has not had to stop exercising or stop walking at any point because of discomfort.  Denies : Chest pain. Dizziness. Leg edema. Nitroglycerin use. Orthopnea. Palpitations. Paroxysmal nocturnal dyspnea.  Syncope.      Past Medical History:  Diagnosis Date   Basal cell carcinoma 05/19/2017   behind left ear cx3 85fu   History of retinal detachment    Hyperlipidemia    Prostate cancer [facility])     Past Surgical History:  Procedure Laterality Date   BASAL CELL CARCINOMA EXCISION  2019   COLONOSCOPY  2013   INGUINAL HERNIA REPAIR Left 11/04/2019    Procedure: LEFT INGUINAL HERNIA REPAIR WITH MESH;  Surgeon: Erroll Luna, MD;  Location: [place];  Service: General;  Laterality: Left;   INSERTION OF MESH Left 11/04/2019   Procedure: INSERTION OF MESH;  Surgeon: Erroll Luna, MD;  Location: [place];  Service: General;  Laterality: Left;   PROSTATECTOMY  2004   RETINAL DETACHMENT SURGERY Right 2015   RETINAL LASER PROCEDURE  2017   SIGMOIDOSCOPY  2000   TOTAL KNEE ARTHROPLASTY Bilateral 2009     Current Outpatient Medications  Medication Sig Dispense Refill   Ascorbic Acid (VITAMIN C) 1000 MG tablet Take 1,000 mg by mouth daily.     b complex vitamins tablet Take 1 tablet by mouth daily.     brimonidine (ALPHAGAN) 0.2 % ophthalmic solution Place 1 drop into the right eye 2 (two) times daily.     calcium-vitamin D (OSCAL WITH D) 500-200 MG-UNIT tablet Take 1 tablet by mouth.     Coenzyme Q10 100 MG capsule Take 100 mg by mouth.      magnesium 30 MG tablet Take 30 mg by mouth 2 (two) times daily.     metoprolol tartrate (LOPRESSOR) 100 MG tablet Take one tablet by mouth 2 hours prior to CT Scan 1 tablet 0   MILK THISTLE PO  Take by mouth.     Multiple Vitamin (MULTIVITAMIN) capsule Take by mouth.     nitroGLYCERIN (NITROSTAT) 0.4 MG SL tablet Place 1 tablet (0.4 mg total) under the tongue every 5 (five) minutes as needed for chest pain. 25 tablet 6   Omega-3 Fatty Acids (FISH OIL) 1000 MG CAPS Take by mouth.     pantoprazole (PROTONIX) 40 MG tablet Take 1 tablet (40 mg total) by mouth daily. 30 tablet 6   rosuvastatin (CRESTOR) 20 MG tablet Take 1 tablet (20 mg total) by mouth daily. 90 tablet 3   timolol (TIMOPTIC) 0.5 % ophthalmic solution timolol maleate 0.5 % eye drops  PLACE 1 DROP INTO BOTH EYES TWICE DAILY     fluticasone-salmeterol (ADVAIR HFA) 115-21 MCG/ACT inhaler Inhale 2 puffs into the lungs 2 (two) times daily. 1 each 6   No current facility-administered medications for this visit.     Allergies:   Patient has no known allergies.    Social History:  The patient  reports that he has never smoked. He has never used smokeless tobacco. He reports current alcohol use. He reports that he does not use drugs.   Family History:  The patient's family history includes COPD in his mother; Cancer in his daughter, father, and paternal uncle.    ROS:  Please see the history of present illness.   Otherwise, review of systems are positive for chronic cough.   All other systems are reviewed and negative.    PHYSICAL EXAM: VS:  BP 130/70   Pulse 88   Ht 6\' 1"  (1.854 m)   Wt 227 lb (103 kg)   SpO2 97%   BMI 29.95 kg/m  , BMI Body mass index is 29.95 kg/m. GEN: Well nourished, well developed, in no acute distress HEENT: normal Neck: no JVD, carotid bruits, or masses Cardiac: RRR; no murmurs, rubs, or gallops,no edema  Respiratory:  clear to auscultation bilaterally, normal work of breathing GI: soft, nontender, nondistended, + BS MS: no deformity or atrophy Skin: warm and dry, no rash Neuro:  Strength and sensation are intact Psych: euthymic mood, full affect   Recent Labs: 01/17/2021: BUN 12; Creatinine, Ser 0.82; Potassium 4.2; Sodium 138   Lipid Panel    Component Value Date/Time   CHOL 240 (H) 11/27/2020 1017   TRIG 100.0 11/27/2020 1017   HDL 53.30 11/27/2020 1017   CHOLHDL 4 11/27/2020 1017   VLDL 20.0 11/27/2020 1017   LDLCALC 166 (H) 11/27/2020 1017     Other studies Reviewed: Additional studies/ records that were reviewed today with results demonstrating: Labs reviewed.  LDL 66, HDL 53, total cholesterol 240, triglycerides 100 in October 2022..   ASSESSMENT AND PLAN:  CAD: Significant OM lesion noted by CTA.  No angina.  Prescribe sublingual nitroglycerin.  We discussed Imdur but given his lack of symptoms, will hold off. Hyperlipidemia: LDL 166.  Rosuvastatin 20 mg started.  Ideally, we will get his LDL down to 70.  We talked about a whole food,  plant-based diet. Thoracic aortic aneurysm: 43 mm on last check.  CT scan in 6 months to reassess.  We discussed metoprolol to help slow the progression.  He prefers not to take any prescription medication if he can avoid it.  We will continue to monitor his heart rate and blood pressure. Preoperative evaluation: No further cardiac testing needed prior to hernia surgery.  No indication for revascularization.  I think placing a stent in a small circumflex vessel in an asymptomatic  patient would only increase his risk of surgery since Plavix would have to be held.  Would proceed with surgery without any revascularization.  The risks and benefits of both strategies were discussed with the patient and they are in agreement.   Current medicines are reviewed at length with the patient today.  The patient concerns regarding his medicines were addressed.  The following changes have been made:  No change  Labs/ tests ordered today include:  No orders of the defined types were placed in this encounter.   Recommend 150 minutes/week of aerobic exercise Low fat, low carb, high fiber diet recommended  Disposition:   FU in 6 months   Signed, Larae Grooms, MD  01/26/2021 5:10 PM    [facility] [address], [place], [place]  [address] Phone: [phone]; Fax: [phone]

## 2021-01-26 NOTE — Patient Instructions (Addendum)
Medication Instructions:  Your physician recommends that you continue on your current medications as directed. Please refer to the Current Medication list given to you today. A prescription for nitroglycerin to use as needed has been sent to your pharmacy *If you need a refill on your cardiac medications before your next appointment, please call your pharmacy*   Lab Work: none If you have labs (blood work) drawn today and your tests are completely normal, you will receive your results only by: [place] (if you have MyChart) OR A paper copy in the mail If you have any lab test that is abnormal or we need to change your treatment, we will call you to review the results.   Testing/Procedures: none   Follow-Up: At [facility], you and your health needs are our priority.  As part of our continuing mission to provide you with exceptional heart care, we have created designated Provider Care Teams.  These Care Teams include your primary Cardiologist (physician) and Advanced Practice Providers (APPs -  Physician Assistants and Nurse Practitioners) who all work together to provide you with the care you need, when you need it.  We recommend signing up for the patient portal called "MyChart".  Sign up information is provided on this After Visit Summary.  MyChart is used to connect with patients for Virtual Visits (Telemedicine).  Patients are able to view lab/test results, encounter notes, upcoming appointments, etc.  Non-urgent messages can be sent to your provider as well.   To learn more about what you can do with MyChart, go to [email or web address].    Your next appointment:   August 01, 2021 at 8:20  The format for your next appointment:   In Person  Provider:   Larae Grooms, MD     Other Instructions Nitroglycerin Sublingual Tablets What is this medication? NITROGLYCERIN (nye troe GLI ser in) prevents and treats chest pain (angina). It works by relaxing blood vessels,  which decreases the amount of work the heart has to do. It belongs to a group of medications called nitrates. This medicine may be used for other purposes; ask your health care provider or pharmacist if you have questions. COMMON BRAND NAME(S): Nitroquick, Nitrostat, Nitrotab What should I tell my care team before I take this medication? They need to know if you have any of these conditions: Anemia Head injury, recent stroke, or bleeding in the brain Liver disease Previous heart attack An unusual or allergic reaction to nitroglycerin, other medications, foods, dyes, or preservatives Pregnant or trying to get pregnant Breast-feeding How should I use this medication? Take this medication by mouth as needed. Use at the first sign of an angina attack (chest pain or tightness). You can also take this medication 5 to 10 minutes before an event likely to produce chest pain. Follow the directions exactly as written on the prescription label. Place one tablet under your tongue and let it dissolve. Do not swallow whole. Replace the dose if you accidentally swallow it. It will help if your mouth is not dry. Saliva around the tablet will help it to dissolve more quickly. Do not eat or drink, smoke or chew tobacco while a tablet is dissolving. Sit down when taking this medication. In an angina attack, you should feel better within 5 minutes after your first dose. You can take a dose every 5 minutes up to a total of 3 doses. If you do not feel better or feel worse after 1 dose, call 9-1-1 at once. Do not take  more than 3 doses in 15 minutes. Your care team might give you other directions. Follow those directions if they do. Do not take your medication more often than directed. Talk to your care team about the use of this medication in children. Special care may be needed. Overdosage: If you think you have taken too much of this medicine contact a poison control center or emergency room at once. NOTE: This  medicine is only for you. Do not share this medicine with others. What if I miss a dose? This does not apply. This medication is only used as needed. What may interact with this medication? Do not take this medication with any of the following: Certain migraine medications like ergotamine and dihydroergotamine (DHE) Medications used to treat erectile dysfunction like sildenafil, tadalafil, and vardenafil Riociguat This medication may also interact with the following: Alteplase Aspirin Heparin Medications for high blood pressure Medications for mental depression Other medications used to treat angina Phenothiazines like chlorpromazine, mesoridazine, prochlorperazine, thioridazine This list may not describe all possible interactions. Give your health care provider a list of all the medicines, herbs, non-prescription drugs, or dietary supplements you use. Also tell them if you smoke, drink alcohol, or use illegal drugs. Some items may interact with your medicine. What should I watch for while using this medication? Tell your care team if you feel your medication is no longer working. Keep this medication with you at all times. Sit or lie down when you take your medication to prevent falling if you feel dizzy or faint after using it. Try to remain calm. This will help you to feel better faster. If you feel dizzy, take several deep breaths and lie down with your feet propped up, or bend forward with your head resting between your knees. You may get drowsy or dizzy. Do not drive, use machinery, or do anything that needs mental alertness until you know how this medication affects you. Do not stand or sit up quickly, especially if you are an older patient. This reduces the risk of dizzy or fainting spells. Alcohol can make you more drowsy and dizzy. Avoid alcoholic drinks. Do not treat yourself for coughs, colds, or pain while you are taking this medication without asking your care team for advice. Some  ingredients may increase your blood pressure. What side effects may I notice from receiving this medication? Side effects that you should report to your care team as soon as possible: Allergic reactions--skin rash, itching, hives, swelling of the face, lips, tongue, or throat Headache, unusual weakness or fatigue, shortness of breath, nausea, vomiting, rapid heartbeat, blue skin or lips, which may be signs of methemoglobinemia Increased pressure around the brain--severe headache, blurry vision, change in vision, nausea, vomiting Low blood pressure--dizziness, feeling faint or lightheaded, blurry vision Slow heartbeat--dizziness, feeling faint or lightheaded, confusion, trouble breathing, unusual weakness or fatigue Worsening chest pain (angina)--pain, pressure, or tightness in the chest, neck, back, or arms Side effects that usually do not require medical attention (report to your care team if they continue or are bothersome): Dizziness Flushing Headache This list may not describe all possible side effects. Call your doctor for medical advice about side effects. You may report side effects to FDA at 1-800-FDA-1088. Where should I keep my medication? Keep out of the reach of children. Store at room temperature between 20 and 25 degrees C (68 and 77 degrees F). Store in Chief of Staff. Protect from light and moisture. Keep tightly closed. Throw away any unused medication after the  expiration date. NOTE: This sheet is a summary. It may not cover all possible information. If you have questions about this medicine, talk to your doctor, pharmacist, or health care provider.  2022 Elsevier/Gold Standard (2020-05-16 00:00:00)

## 2021-01-26 NOTE — Telephone Encounter (Signed)
   Name: Bryan Cowan  DOB: February 04, 1951  MRN: [id number]  Primary Cardiologist: Larae Grooms, MD  Chart reviewed as part of pre-operative protocol coverage. Because of Bryan Cowan past medical history and time since last visit, he will require a follow-up visit in order to better assess preoperative cardiovascular risk. See notes below from Dr. Irish Lack requesting appt. He also says virtual is fine.  Pre-op covering staff: - Please schedule appointment and call patient to inform them.  - Please contact requesting surgeon's office via preferred method (i.e, phone, fax) to inform them of need for appointment prior to surgery.  I also do not see that patient is on any ASA - will also route to Dr. Irish Lack to advise whether he wants to start an aspirin or discuss at visit. Plan for stopping prior to surgery can be reviewed based on symptoms at OV.  [name], PA-C  01/26/2021, 7:46 AM

## 2021-01-26 NOTE — Telephone Encounter (Signed)
I spoke with patient and scheduled him to see Dr Irish Lack today at 4:30 in office

## 2021-01-29 NOTE — Telephone Encounter (Signed)
Notes have been faxed to requesting office 

## 2021-01-31 NOTE — Telephone Encounter (Signed)
Callback team already faxed OV/clearance. Will remove from preop box. Marck Mcclenny PA-C

## 2021-02-01 ENCOUNTER — Encounter (HOSPITAL_BASED_OUTPATIENT_CLINIC_OR_DEPARTMENT_OTHER): Payer: Self-pay | Admitting: Surgery

## 2021-02-05 ENCOUNTER — Other Ambulatory Visit: Payer: Self-pay

## 2021-02-05 ENCOUNTER — Encounter: Payer: Self-pay | Admitting: Pulmonary Disease

## 2021-02-05 ENCOUNTER — Ambulatory Visit (INDEPENDENT_AMBULATORY_CARE_PROVIDER_SITE_OTHER): Payer: Medicare Other | Admitting: Pulmonary Disease

## 2021-02-05 VITALS — BP 118/72 | HR 68 | Ht 72.0 in | Wt 225.0 lb

## 2021-02-05 DIAGNOSIS — R0602 Shortness of breath: Secondary | ICD-10-CM

## 2021-02-05 DIAGNOSIS — R059 Cough, unspecified: Secondary | ICD-10-CM | POA: Diagnosis not present

## 2021-02-05 DIAGNOSIS — J449 Chronic obstructive pulmonary disease, unspecified: Secondary | ICD-10-CM

## 2021-02-05 LAB — PULMONARY FUNCTION TEST
DL/VA % pred: 90 %
DL/VA: 3.58 ml/min/mmHg/L
DLCO cor % pred: 94 %
DLCO cor: 26.44 ml/min/mmHg
DLCO unc % pred: 94 %
DLCO unc: 26.44 ml/min/mmHg
FEF 25-75 Post: 2.04 L/sec
FEF 25-75 Pre: 1.32 L/sec
FEF2575-%Change-Post: 54 %
FEF2575-%Pred-Post: 76 %
FEF2575-%Pred-Pre: 49 %
FEV1-%Change-Post: 14 %
FEV1-%Pred-Post: 81 %
FEV1-%Pred-Pre: 71 %
FEV1-Post: 2.89 L
FEV1-Pre: 2.54 L
FEV1FVC-%Change-Post: 7 %
FEV1FVC-%Pred-Pre: 86 %
FEV6-%Change-Post: 6 %
FEV6-%Pred-Post: 90 %
FEV6-%Pred-Pre: 84 %
FEV6-Post: 4.16 L
FEV6-Pre: 3.89 L
FEV6FVC-%Change-Post: 0 %
FEV6FVC-%Pred-Post: 103 %
FEV6FVC-%Pred-Pre: 103 %
FVC-%Change-Post: 6 %
FVC-%Pred-Post: 87 %
FVC-%Pred-Pre: 81 %
FVC-Post: 4.24 L
FVC-Pre: 3.98 L
Post FEV1/FVC ratio: 68 %
Post FEV6/FVC ratio: 98 %
Pre FEV1/FVC ratio: 64 %
Pre FEV6/FVC Ratio: 98 %
RV % pred: 158 %
RV: 4.2 L
TLC % pred: 108 %
TLC: 8.33 L

## 2021-02-05 NOTE — Progress Notes (Signed)
Synopsis: Referred in November 2022 for abnormal chest x-ray by Arlester Marker, MD  Subjective:   PATIENT ID: Bryan Cowan GENDER: male DOB: May 07, 1950, MRN: [id number]  HPI  Chief Complaint  Patient presents with   Follow-up    55mo f/u after PFT and CT scan. States his breathing has actually improved since last visit.    Bryan Cowan is a 70 year old man, never smoker with history of prostate cancer and hyperlipidemia who returns to pulmonary clinic for bronchitis.  Patient was started on pantoprazole 40 mg daily for history of reflux which she notes improvement in his cough and wheezing since starting this medication.  He has not started ICS/LABA inhaler therapy with Advair as he is waiting for his insurance to change over in the new year.  Pulmonary function tests show mild obstructive defect with normal TLC and normal DLCO.  HRCT chest shows no significant pulmonary parenchymal findings.  There are scattered airways with mild bronchial thickening.  OV 12/20/20 Patient reports developing shortness of breath, cough with clear sputum production and intermittent wheezing over the past 3 to 6 months.  He had a chest radiograph on 11/27/2020 which showed calcified granuloma of the right upper lobe and calcified right hilar lymph node along with scattered peribronchial thickening.  He is a never smoker but reports being exposed to secondhand smoke exposure from both his parents in childhood.  In the past he has had issues with seasonal allergies but over recent years denies any symptoms from seasonal allergies.  He does report reflux symptoms on a regular basis.  His wife has asked if the timolol eyedrops that he was started on 6 months ago could be contributing to his respiratory symptoms.  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  He denies any significant dust or chemical exposures over the years.  Past Medical History:  Diagnosis Date   Basal  cell carcinoma 05/19/2017   behind left ear cx3 46fu   Coronary artery disease    History of retinal detachment    Hyperlipidemia    Prostate cancer ([name])      Family History  Problem Relation Age of Onset   COPD Mother    Cancer Father        Lung   Cancer Daughter        Melanoma   Cancer Paternal Uncle        Liver   Colon cancer Neg Hx    Esophageal cancer Neg Hx    Prostate cancer Neg Hx    Rectal cancer Neg Hx      Social History   Socioeconomic History   Marital status: Married    Spouse name: Not on file   Number of children: 3   Years of education: Not on file   Highest education level: Not on file  Occupational History   Occupation: Retired- HR Management  Tobacco Use   Smoking status: Never   Smokeless tobacco: Never  Vaping Use   Vaping Use: Never used  Substance and Sexual Activity   Alcohol use: Yes    Comment: Beer per day   Drug use: Never   Sexual activity: Yes  Other Topics Concern   Not on file  Social History Narrative   Moved here from [place], [place] in 05/2015.   Social Determinants of Health   Financial Resource Strain: Low Risk    Difficulty of Paying Living Expenses: Not hard at  all  Food Insecurity: No Food Insecurity   Worried About Charity fundraiser in the Last Year: Never true   Ran Out of Food in the Last Year: Never true  Transportation Needs: No Transportation Needs   Lack of Transportation (Medical): No   Lack of Transportation (Non-Medical): No  Physical Activity: Sufficiently Active   Days of Exercise per Week: 5 days   Minutes of Exercise per Session: 90 min  Stress: No Stress Concern Present   Feeling of Stress : Not at all  Social Connections: Moderately Isolated   Frequency of Communication with Friends and Family: More than three times a week   Frequency of Social Gatherings with Friends and Family: More than three times a week   Attends Religious Services: Never   Marine scientist or Organizations: No    Attends Music therapist: Never   Marital Status: Married  Human resources officer Violence: Not At Risk   Fear of Current or Ex-Partner: No   Emotionally Abused: No   Physically Abused: No   Sexually Abused: No     No Known Allergies   Outpatient Medications Prior to Visit  Medication Sig Dispense Refill   Ascorbic Acid (VITAMIN C) 1000 MG tablet Take 1,000 mg by mouth daily.     aspirin EC 81 MG tablet Take 81 mg by mouth daily. Swallow whole.     b complex vitamins tablet Take 1 tablet by mouth daily.     brimonidine (ALPHAGAN) 0.2 % ophthalmic solution Place 1 drop into the right eye 2 (two) times daily.     cholecalciferol (VITAMIN D3) 25 MCG (1000 UNIT) tablet Take 1,000 Units by mouth daily.     Coenzyme Q10 100 MG capsule Take 100 mg by mouth.      magnesium 30 MG tablet Take 30 mg by mouth 2 (two) times daily.     MILK THISTLE PO Take by mouth.     nitroGLYCERIN (NITROSTAT) 0.4 MG SL tablet Place 1 tablet (0.4 mg total) under the tongue every 5 (five) minutes as needed for chest pain. 25 tablet 6   Omega-3 Fatty Acids (FISH OIL) 1000 MG CAPS Take by mouth.     pantoprazole (PROTONIX) 40 MG tablet Take 1 tablet (40 mg total) by mouth daily. 30 tablet 6   rosuvastatin (CRESTOR) 20 MG tablet Take 1 tablet (20 mg total) by mouth daily. 90 tablet 3   timolol (TIMOPTIC) 0.5 % ophthalmic solution timolol maleate 0.5 % eye drops  PLACE 1 DROP INTO BOTH EYES TWICE DAILY     fluticasone-salmeterol (ADVAIR HFA) 115-21 MCG/ACT inhaler Inhale 2 puffs into the lungs 2 (two) times daily. (Patient not taking: Reported on 02/05/2021) 1 each 6   No facility-administered medications prior to visit.    Review of Systems  Constitutional:  Negative for chills, fever, malaise/fatigue and weight loss.  HENT:  Negative for congestion, sinus pain and sore throat.   Eyes: Negative.   Respiratory:  Positive for cough, shortness of breath and wheezing. Negative for hemoptysis and sputum  production.   Cardiovascular:  Negative for chest pain, palpitations, orthopnea, claudication and leg swelling.  Gastrointestinal:  Positive for heartburn. Negative for abdominal pain, nausea and vomiting.  Genitourinary: Negative.   Musculoskeletal:  Negative for joint pain and myalgias.  Skin:  Negative for rash.  Neurological:  Negative for weakness.  Endo/Heme/Allergies: Negative.   Psychiatric/Behavioral: Negative.     Objective:   Vitals:   02/05/21 1111  BP:  118/72  Pulse: 68  SpO2: 99%  Weight: 225 lb (102.1 kg)  Height: 6' (1.829 m)   Physical Exam Constitutional:      General: He is not in acute distress. HENT:     Head: Normocephalic and atraumatic.  Eyes:     Extraocular Movements: Extraocular movements intact.     Conjunctiva/sclera: Conjunctivae normal.     Pupils: Pupils are equal, round, and reactive to light.  Cardiovascular:     Rate and Rhythm: Normal rate and regular rhythm.     Pulses: Normal pulses.     Heart sounds: Normal heart sounds. No murmur heard. Pulmonary:     Effort: Pulmonary effort is normal.     Breath sounds: Normal breath sounds.  Musculoskeletal:     Right lower leg: No edema.     Left lower leg: No edema.  Lymphadenopathy:     Cervical: No cervical adenopathy.  Skin:    General: Skin is warm and dry.  Neurological:     General: No focal deficit present.     Mental Status: He is alert.  Psychiatric:        Mood and Affect: Mood normal.        Behavior: Behavior normal.        Thought Content: Thought content normal.        Judgment: Judgment normal.   CBC    Component Value Date/Time   WBC 6.0 11/01/2019 0939   RBC 5.19 11/01/2019 0939   HGB 13.8 11/01/2019 0939   HCT 43.5 11/01/2019 0939   PLT 216 11/01/2019 0939   MCV 83.8 11/01/2019 0939   MCH 26.6 11/01/2019 0939   MCHC 31.7 11/01/2019 0939   RDW 13.5 11/01/2019 0939   LYMPHSABS 2.0 11/01/2019 0939   MONOABS 0.6 11/01/2019 0939   EOSABS 0.5 11/01/2019 0939    BASOSABS 0.1 11/01/2019 0939   BMP Latest Ref Rng & Units 01/17/2021 11/01/2019 09/09/2019  Glucose 70 - 99 mg/dL 95 102(H) 101(H)  BUN 8 - 27 mg/dL 12 10 14   Creatinine 0.76 - 1.27 mg/dL 0.82 0.75 0.72  BUN/Creat Ratio 10 - 24 15 - -  Sodium 134 - 144 mmol/L 138 138 137  Potassium 3.5 - 5.2 mmol/L 4.2 4.4 4.4  Chloride 96 - 106 mmol/L 102 105 103  CO2 20 - 29 mmol/L 25 25 28   Calcium 8.6 - 10.2 mg/dL 8.9 8.9 9.9   Chest imaging: CXR 11/27/2020 1. Mild diffuse peribronchial cuffing which could be seen in the setting of acute or chronic bronchitis. 2. Old granulomatous disease, as above. 3. Aortic atherosclerosis.  PFT: PFT Results Latest Ref Rng & Units 02/05/2021  FVC-Pre L 3.98  FVC-Predicted Pre % 81  FVC-Post L 4.24  FVC-Predicted Post % 87  Pre FEV1/FVC % % 64  Post FEV1/FCV % % 68  FEV1-Pre L 2.54  FEV1-Predicted Pre % 71  FEV1-Post L 2.89  DLCO uncorrected ml/min/mmHg 26.44  DLCO UNC% % 94  DLCO corrected ml/min/mmHg 26.44  DLCO COR %Predicted % 94  DLVA Predicted % 90  TLC L 8.33  TLC % Predicted % 108  RV % Predicted % 158    Labs:  Path:  Echo:  Heart Catheterization:  Assessment & Plan:   Chronic obstructive pulmonary disease, unspecified COPD type (HCC)  Discussion: Emett Stapel is a 70 year old man, never smoker with history of prostate cancer and hyperlipidemia who returns to pulmonary clinic for bronchitis.  His chest radiograph and CT chest scan  shows a calcified granuloma and a calcified right hilar lymph node likely from a previous fungal [place]   The diffuse peribronchial cuffing along with his symptoms of dyspnea, cough and intermittent wheezing over the past 3-6 months are concerning for bronchitis. The etiology of why he would develop bronchitis is possibly due to underlying reflux disease.  His symptoms have improved with the addition of 40 mg pantoprazole daily which he is to continue. He is to  elevate the head of the bed at night when sleeping and eating 2 hours prior to bedtime to avoid nocturnal reflux symptoms.  He is to start advair 115-66mcg 2 puffs twice daily for obstructive lung disease as noted on his pulmonary function tests today.   Follow up in 4 months.  Freda Jackson, MD [facility] Office: [phone]    Current Outpatient Medications:    Ascorbic Acid (VITAMIN C) 1000 MG tablet, Take 1,000 mg by mouth daily., Disp: , Rfl:    aspirin EC 81 MG tablet, Take 81 mg by mouth daily. Swallow whole., Disp: , Rfl:    b complex vitamins tablet, Take 1 tablet by mouth daily., Disp: , Rfl:    brimonidine (ALPHAGAN) 0.2 % ophthalmic solution, Place 1 drop into the right eye 2 (two) times daily., Disp: , Rfl:    cholecalciferol (VITAMIN D3) 25 MCG (1000 UNIT) tablet, Take 1,000 Units by mouth daily., Disp: , Rfl:    Coenzyme Q10 100 MG capsule, Take 100 mg by mouth. , Disp: , Rfl:    magnesium 30 MG tablet, Take 30 mg by mouth 2 (two) times daily., Disp: , Rfl:    MILK THISTLE PO, Take by mouth., Disp: , Rfl:    nitroGLYCERIN (NITROSTAT) 0.4 MG SL tablet, Place 1 tablet (0.4 mg total) under the tongue every 5 (five) minutes as needed for chest pain., Disp: 25 tablet, Rfl: 6   Omega-3 Fatty Acids (FISH OIL) 1000 MG CAPS, Take by mouth., Disp: , Rfl:    pantoprazole (PROTONIX) 40 MG tablet, Take 1 tablet (40 mg total) by mouth daily., Disp: 30 tablet, Rfl: 6   rosuvastatin (CRESTOR) 20 MG tablet, Take 1 tablet (20 mg total) by mouth daily., Disp: 90 tablet, Rfl: 3   timolol (TIMOPTIC) 0.5 % ophthalmic solution, timolol maleate 0.5 % eye drops  PLACE 1 DROP INTO BOTH EYES TWICE DAILY, Disp: , Rfl:    fluticasone-salmeterol (ADVAIR HFA) 115-21 MCG/ACT inhaler, Inhale 2 puffs into the lungs 2 (two) times daily. (Patient not taking: Reported on 02/05/2021), Disp: 1 each, Rfl: 6

## 2021-02-05 NOTE — Patient Instructions (Addendum)
Start advair inhaler 2 puffs twice daily - rinse mouth out after each use   You breathing tests show mild obstructive defect which should improve with inhaler therapy.   Continue pantoprazole 40mg  daily 30 minutes before breakfast for GERD   Recommend eating 2 hours before bedtime to reduce night time reflux symptoms  Follow up in 4 months

## 2021-02-05 NOTE — Progress Notes (Signed)
Text message reminder sent to come to [facility] today to pick up pre-surgery drink and soap.

## 2021-02-05 NOTE — Progress Notes (Signed)
PFT done today. 

## 2021-02-05 NOTE — Progress Notes (Signed)
° ° ° ° °  Enhanced Recovery after Surgery for Orthopedics Enhanced Recovery after Surgery is a protocol used to improve the stress on your body and your recovery after surgery.  Patient Instructions  The night before surgery:  No food after midnight. ONLY clear liquids after midnight  The day of surgery (if you do NOT have diabetes):  Drink ONE (1) Pre-Surgery Clear Ensure as directed.   This drink was given to you during your hospital  pre-op appointment visit. The pre-op nurse will instruct you on the time to drink the  Pre-Surgery Ensure depending on your surgery time. Finish the drink at the designated time by the pre-op nurse.  Nothing else to drink after completing the  Pre-Surgery Clear Ensure.  The day of surgery (if you have diabetes): Drink ONE (1) Gatorade 2 (G2) as directed. This drink was given to you during your hospital  pre-op appointment visit.  The pre-op nurse will instruct you on the time to drink the   Gatorade 2 (G2) depending on your surgery time. Color of the Gatorade may vary. Red is not allowed. Nothing else to drink after completing the  Gatorade 2 (G2).         If you have questions, please contact your surgeons office.  Surgical soap given with instructions.

## 2021-02-06 ENCOUNTER — Encounter: Payer: Self-pay | Admitting: Pulmonary Disease

## 2021-02-07 ENCOUNTER — Encounter (HOSPITAL_BASED_OUTPATIENT_CLINIC_OR_DEPARTMENT_OTHER)
Admission: RE | Disposition: A | Discharge status: Home / Self Care | Payer: Self-pay | Source: Home / Self Care | Attending: Surgery

## 2021-02-07 ENCOUNTER — Ambulatory Visit (HOSPITAL_BASED_OUTPATIENT_CLINIC_OR_DEPARTMENT_OTHER): Payer: Medicare Other | Admitting: Anesthesiology

## 2021-02-07 ENCOUNTER — Encounter (HOSPITAL_BASED_OUTPATIENT_CLINIC_OR_DEPARTMENT_OTHER): Payer: Self-pay | Admitting: Surgery

## 2021-02-07 ENCOUNTER — Ambulatory Visit (HOSPITAL_BASED_OUTPATIENT_CLINIC_OR_DEPARTMENT_OTHER)
Admission: RE | Admit: 2021-02-07 | Discharge: 2021-02-07 | Disposition: A | Discharge status: Home / Self Care | Payer: Medicare Other | Attending: Surgery | Admitting: Surgery

## 2021-02-07 DIAGNOSIS — I2510 Atherosclerotic heart disease of native coronary artery without angina pectoris: Secondary | ICD-10-CM | POA: Insufficient documentation

## 2021-02-07 DIAGNOSIS — K4090 Unilateral inguinal hernia, without obstruction or gangrene, not specified as recurrent: Secondary | ICD-10-CM | POA: Insufficient documentation

## 2021-02-07 DIAGNOSIS — K219 Gastro-esophageal reflux disease without esophagitis: Secondary | ICD-10-CM | POA: Insufficient documentation

## 2021-02-07 DIAGNOSIS — G8918 Other acute postprocedural pain: Secondary | ICD-10-CM | POA: Diagnosis not present

## 2021-02-07 DIAGNOSIS — E669 Obesity, unspecified: Secondary | ICD-10-CM | POA: Diagnosis not present

## 2021-02-07 DIAGNOSIS — I700 Atherosclerosis of aorta: Secondary | ICD-10-CM | POA: Diagnosis not present

## 2021-02-07 DIAGNOSIS — Z6830 Body mass index (BMI) 30.0-30.9, adult: Secondary | ICD-10-CM | POA: Diagnosis not present

## 2021-02-07 DIAGNOSIS — E785 Hyperlipidemia, unspecified: Secondary | ICD-10-CM | POA: Diagnosis not present

## 2021-02-07 HISTORY — PX: INGUINAL HERNIA REPAIR: SHX194

## 2021-02-07 HISTORY — DX: Atherosclerotic heart disease of native coronary artery without angina pectoris: I25.10

## 2021-02-07 HISTORY — PX: INSERTION OF MESH: SHX5868

## 2021-02-07 SURGERY — REPAIR, HERNIA, INGUINAL, ADULT
Anesthesia: General | Site: Groin | Laterality: Right

## 2021-02-07 MED ORDER — PROPOFOL 500 MG/50ML IV EMUL
INTRAVENOUS | Status: AC
  Filled 2021-02-07: qty 50

## 2021-02-07 MED ORDER — PROPOFOL 10 MG/ML IV BOLUS
INTRAVENOUS | Status: AC
  Filled 2021-02-07: qty 20

## 2021-02-07 MED ORDER — FENTANYL CITRATE (PF) 100 MCG/2ML IJ SOLN
50.0000 ug | Freq: Once | INTRAMUSCULAR | Status: AC
  Administered 2021-02-07: 12:00:00 50 ug via INTRAVENOUS

## 2021-02-07 MED ORDER — AMISULPRIDE (ANTIEMETIC) 5 MG/2ML IV SOLN: 10.0000 mg | Freq: Once | INTRAVENOUS | Status: DC | PRN

## 2021-02-07 MED ORDER — ACETAMINOPHEN 500 MG PO TABS
ORAL_TABLET | ORAL | Status: AC
  Filled 2021-02-07: qty 2

## 2021-02-07 MED ORDER — ACETAMINOPHEN 500 MG PO TABS
1000.0000 mg | ORAL_TABLET | ORAL | Status: AC
  Administered 2021-02-07: 12:00:00 1000 mg via ORAL

## 2021-02-07 MED ORDER — ROCURONIUM BROMIDE 100 MG/10ML IV SOLN
INTRAVENOUS | Status: DC | PRN
  Administered 2021-02-07: 60 mg via INTRAVENOUS

## 2021-02-07 MED ORDER — ROCURONIUM BROMIDE 10 MG/ML (PF) SYRINGE
PREFILLED_SYRINGE | INTRAVENOUS | Status: AC
  Filled 2021-02-07: qty 10

## 2021-02-07 MED ORDER — SUGAMMADEX SODIUM 200 MG/2ML IV SOLN
INTRAVENOUS | Status: DC | PRN
  Administered 2021-02-07: 200 mg via INTRAVENOUS

## 2021-02-07 MED ORDER — FENTANYL CITRATE (PF) 100 MCG/2ML IJ SOLN
INTRAMUSCULAR | Status: DC | PRN
  Administered 2021-02-07 (×2): 50 ug via INTRAVENOUS

## 2021-02-07 MED ORDER — EPHEDRINE SULFATE 50 MG/ML IJ SOLN
INTRAMUSCULAR | Status: DC | PRN
Start: 2021-02-07 — End: 2021-02-07
  Administered 2021-02-07: 15 mg via INTRAVENOUS
  Administered 2021-02-07: 10 mg via INTRAVENOUS

## 2021-02-07 MED ORDER — LIDOCAINE HCL (CARDIAC) PF 100 MG/5ML IV SOSY
PREFILLED_SYRINGE | INTRAVENOUS | Status: DC | PRN
  Administered 2021-02-07: 80 mg via INTRAVENOUS

## 2021-02-07 MED ORDER — MIDAZOLAM HCL 2 MG/2ML IJ SOLN
INTRAMUSCULAR | Status: AC
  Filled 2021-02-07: qty 2

## 2021-02-07 MED ORDER — PROPOFOL 10 MG/ML IV BOLUS
INTRAVENOUS | Status: DC | PRN
  Administered 2021-02-07: 200 mg via INTRAVENOUS

## 2021-02-07 MED ORDER — OXYCODONE HCL 5 MG/5ML PO SOLN: 5.0000 mg | Freq: Once | ORAL | Status: DC | PRN

## 2021-02-07 MED ORDER — PROMETHAZINE HCL 25 MG/ML IJ SOLN: 6.2500 mg | INTRAMUSCULAR | Status: DC | PRN

## 2021-02-07 MED ORDER — IBUPROFEN 800 MG PO TABS: 800.0000 mg | ORAL_TABLET | Freq: Three times a day (TID) | ORAL | 0 refills | Status: DC | PRN

## 2021-02-07 MED ORDER — CHLORHEXIDINE GLUCONATE CLOTH 2 % EX PADS: 6.0000 | MEDICATED_PAD | Freq: Once | CUTANEOUS | Status: DC

## 2021-02-07 MED ORDER — LIDOCAINE 2% (20 MG/ML) 5 ML SYRINGE
INTRAMUSCULAR | Status: AC
  Filled 2021-02-07: qty 5

## 2021-02-07 MED ORDER — PHENYLEPHRINE 40 MCG/ML (10ML) SYRINGE FOR IV PUSH (FOR BLOOD PRESSURE SUPPORT)
PREFILLED_SYRINGE | INTRAVENOUS | Status: AC
  Filled 2021-02-07: qty 10

## 2021-02-07 MED ORDER — ONDANSETRON HCL 4 MG/2ML IJ SOLN
INTRAMUSCULAR | Status: DC | PRN
  Administered 2021-02-07: 4 mg via INTRAVENOUS

## 2021-02-07 MED ORDER — CEFAZOLIN SODIUM-DEXTROSE 2-4 GM/100ML-% IV SOLN
2.0000 g | INTRAVENOUS | Status: AC
  Administered 2021-02-07: 13:00:00 2 g via INTRAVENOUS

## 2021-02-07 MED ORDER — BUPIVACAINE-EPINEPHRINE 0.25% -1:200000 IJ SOLN
INTRAMUSCULAR | Status: DC | PRN
  Administered 2021-02-07: 12 mL

## 2021-02-07 MED ORDER — EPHEDRINE 5 MG/ML INJ
INTRAVENOUS | Status: AC
  Filled 2021-02-07: qty 5

## 2021-02-07 MED ORDER — ROPIVACAINE HCL 5 MG/ML IJ SOLN
INTRAMUSCULAR | Status: DC | PRN
  Administered 2021-02-07: 30 mL via PERINEURAL

## 2021-02-07 MED ORDER — DEXAMETHASONE SODIUM PHOSPHATE 4 MG/ML IJ SOLN
INTRAMUSCULAR | Status: DC | PRN
  Administered 2021-02-07: 5 mg via INTRAVENOUS

## 2021-02-07 MED ORDER — OXYCODONE HCL 5 MG PO TABS: 5.0000 mg | ORAL_TABLET | Freq: Four times a day (QID) | ORAL | 0 refills | Status: DC | PRN

## 2021-02-07 MED ORDER — FENTANYL CITRATE (PF) 100 MCG/2ML IJ SOLN
INTRAMUSCULAR | Status: AC
  Filled 2021-02-07: qty 2

## 2021-02-07 MED ORDER — OXYCODONE HCL 5 MG PO TABS: 5.0000 mg | ORAL_TABLET | Freq: Once | ORAL | Status: DC | PRN

## 2021-02-07 MED ORDER — ONDANSETRON HCL 4 MG/2ML IJ SOLN
INTRAMUSCULAR | Status: AC
  Filled 2021-02-07: qty 2

## 2021-02-07 MED ORDER — PHENYLEPHRINE HCL (PRESSORS) 10 MG/ML IV SOLN
INTRAVENOUS | Status: DC | PRN
  Administered 2021-02-07: 120 ug via INTRAVENOUS
  Administered 2021-02-07 (×2): 80 ug via INTRAVENOUS
  Administered 2021-02-07: 120 ug via INTRAVENOUS

## 2021-02-07 MED ORDER — MIDAZOLAM HCL 2 MG/2ML IJ SOLN
2.0000 mg | Freq: Once | INTRAMUSCULAR | Status: AC
  Administered 2021-02-07: 12:00:00 2 mg via INTRAVENOUS

## 2021-02-07 MED ORDER — CEFAZOLIN SODIUM-DEXTROSE 2-4 GM/100ML-% IV SOLN
INTRAVENOUS | Status: AC
  Filled 2021-02-07: qty 100

## 2021-02-07 MED ORDER — HYDROMORPHONE HCL 1 MG/ML IJ SOLN: 0.2500 mg | INTRAMUSCULAR | Status: DC | PRN

## 2021-02-07 MED ORDER — LACTATED RINGERS IV SOLN: INTRAVENOUS | Status: DC

## 2021-02-07 SURGICAL SUPPLY — 45 items
ADH SKN CLS APL DERMABOND .7 (GAUZE/BANDAGES/DRESSINGS) ×1
APL PRP STRL LF DISP 70% ISPRP (MISCELLANEOUS) ×1
BLADE CLIPPER SURG (BLADE) ×2 IMPLANT
BLADE SURG 15 STRL LF DISP TIS (BLADE) ×1 IMPLANT
BLADE SURG 15 STRL SS (BLADE) ×3
CHLORAPREP W/TINT 26 (MISCELLANEOUS) ×3 IMPLANT
COVER BACK TABLE 60X90IN (DRAPES) ×3 IMPLANT
COVER MAYO STAND STRL (DRAPES) ×3 IMPLANT
DERMABOND ADVANCED (GAUZE/BANDAGES/DRESSINGS) ×2
DERMABOND ADVANCED .7 DNX12 (GAUZE/BANDAGES/DRESSINGS) ×1 IMPLANT
DRAIN PENROSE 1/2X12 LTX STRL (WOUND CARE) ×3 IMPLANT
DRAPE LAPAROTOMY TRNSV 102X78 (DRAPES) ×3 IMPLANT
DRAPE UTILITY XL STRL (DRAPES) ×3 IMPLANT
ELECT COATED BLADE 2.86 ST (ELECTRODE) ×3 IMPLANT
ELECT REM PT RETURN 9FT ADLT (ELECTROSURGICAL) ×3
ELECTRODE REM PT RTRN 9FT ADLT (ELECTROSURGICAL) ×1 IMPLANT
GAUZE 4X4 16PLY ~~LOC~~+RFID DBL (SPONGE) ×2 IMPLANT
GLOVE SRG 8 PF TXTR STRL LF DI (GLOVE) ×1 IMPLANT
GLOVE SURG LTX SZ8 (GLOVE) ×3 IMPLANT
GLOVE SURG UNDER POLY LF SZ7 (GLOVE) ×2 IMPLANT
GLOVE SURG UNDER POLY LF SZ8 (GLOVE) ×3
GOWN STRL REUS W/ TWL LRG LVL3 (GOWN DISPOSABLE) ×2 IMPLANT
GOWN STRL REUS W/ TWL XL LVL3 (GOWN DISPOSABLE) ×1 IMPLANT
GOWN STRL REUS W/TWL LRG LVL3 (GOWN DISPOSABLE) ×6
GOWN STRL REUS W/TWL XL LVL3 (GOWN DISPOSABLE) ×3
MESH HERNIA SYS ULTRAPRO LRG (Mesh General) ×2 IMPLANT
NDL HYPO 25X1 1.5 SAFETY (NEEDLE) ×1 IMPLANT
NEEDLE HYPO 25X1 1.5 SAFETY (NEEDLE) ×3 IMPLANT
NS IRRIG 1000ML POUR BTL (IV SOLUTION) ×2 IMPLANT
PACK BASIN DAY SURGERY FS (CUSTOM PROCEDURE TRAY) ×3 IMPLANT
PENCIL SMOKE EVACUATOR (MISCELLANEOUS) ×3 IMPLANT
SLEEVE SCD COMPRESS KNEE MED (STOCKING) ×3 IMPLANT
SPONGE T-LAP 4X18 ~~LOC~~+RFID (SPONGE) ×1 IMPLANT
SUT MON AB 4-0 PC3 18 (SUTURE) ×3 IMPLANT
SUT NOVA 0 T19/GS 22DT (SUTURE) IMPLANT
SUT NOVA NAB DX-16 0-1 5-0 T12 (SUTURE) ×6 IMPLANT
SUT VIC AB 2-0 SH 27 (SUTURE) ×3
SUT VIC AB 2-0 SH 27XBRD (SUTURE) ×1 IMPLANT
SUT VIC AB 3-0 54X BRD REEL (SUTURE) IMPLANT
SUT VIC AB 3-0 BRD 54 (SUTURE)
SUT VICRYL 3-0 CR8 SH (SUTURE) ×3 IMPLANT
SUT VICRYL AB 2 0 TIE (SUTURE) IMPLANT
SUT VICRYL AB 2 0 TIES (SUTURE)
SYR CONTROL 10ML LL (SYRINGE) ×3 IMPLANT
TOWEL GREEN STERILE FF (TOWEL DISPOSABLE) ×3 IMPLANT

## 2021-02-07 NOTE — Anesthesia Preprocedure Evaluation (Signed)
Anesthesia Evaluation  Patient identified by MRN, date of birth, ID band Patient awake    Reviewed: Allergy & Precautions, NPO status , Patient's Chart, lab work & pertinent test results  Airway Mallampati: II  TM Distance: >3 FB Neck ROM: Full    Dental no notable dental hx. (+) Dental Advisory Given, Teeth Intact   Pulmonary neg pulmonary ROS,    Pulmonary exam normal breath sounds clear to auscultation       Cardiovascular + CAD  Normal cardiovascular exam Rhythm:Regular Rate:Normal     Neuro/Psych negative neurological ROS     GI/Hepatic Neg liver ROS, GERD  ,  Endo/Other  negative endocrine ROS  Renal/GU negative Renal ROS     Musculoskeletal negative musculoskeletal ROS (+)   Abdominal (+) + obese,   Peds  Hematology negative hematology ROS (+)   Anesthesia Other Findings   Reproductive/Obstetrics                             Anesthesia Physical  Anesthesia Plan  ASA: 3  Anesthesia Plan: General   Post-op Pain Management: GA combined w/ Regional for post-op pain and Regional block   Induction: Intravenous  PONV Risk Score and Plan: 2 and Ondansetron, Treatment may vary due to age or medical condition and Midazolam  Airway Management Planned: Oral ETT  Additional Equipment:   Intra-op Plan:   Post-operative Plan: Extubation in OR  Informed Consent: I have reviewed the patients History and Physical, chart, labs and discussed the procedure including the risks, benefits and alternatives for the proposed anesthesia with the patient or authorized representative who has indicated his/her understanding and acceptance.     Dental advisory given  Plan Discussed with: CRNA  Anesthesia Plan Comments:         Anesthesia Quick Evaluation

## 2021-02-07 NOTE — Op Note (Signed)
Preoperative diagnosis: Reducible 3 cm right inguinal hernia without obstruction or gangrene  Postoperative diagnosis: Same  Procedure: Repair of right inguinal hernia with mesh  Surgeon: Erroll Luna, MD  Anesthesia: General with TAP block and 0.25% Marcaine plain  EBL: Minimal  Specimen: None  Drains: None  Indications for procedure: The patient is a 70 year old male with a reducible right inguinal hernia.  This is getting larger and causing pain.  He presents today for repair.  The pros and cons of surgery were discussed with the patient.  Complications of surgery as well as nonoperative management discussed with the patient.  Laparoscopic and open techniques discussed with the patient.  After discussion of the above he was to proceed with open repair of his right inguinal hernia with mesh.The risk of hernia repair include bleeding,  Infection,   Recurrence of the hernia,  Mesh use, chronic pain,  Organ injury,  Bowel injury,  Bladder injury,   nerve injury with numbness around the incision,  Death,  and worsening of preexisting  medical problems.  The alternatives to surgery have been discussed as well..  Long term expectations of both operative and non operative treatments have been discussed.   The patient agrees to proceed.     Description of procedure: The patient was met in the holding area and questions were answered.  The right side was marked as the correct site.  He underwent block per anesthesia protocol.  He was then taken back to the operative room.  He is placed supine upon the OR table.  After induction of general esthesia, the right groin region was prepped and draped in a sterile fashion and timeout performed.  Proper patient, site and procedure verified.  After timeout was done and discussing the case as well as the administration of antibiotics, a right groin incision was made after infiltration with local anesthetic.  This was about 5 cm.  Dissection was carried down  through Scarpa's fascia into the aponeurosis of the external bleak was identified.  It was then opened in the direction of its fibers through the external ring.  Cord structures identified and encircled with 1/4 inch Penrose drain.  He had a small hernia sac that was indirect and this was dissected off the cord and reduced back into the preperitoneal space.  A large ultra pro hernia system was used with the relief leaflet placed into the preperitoneal space and the only placed onto the floor the anal canal.  A small slit was cut for the cord structures.  There is then secured to the shelving edge of the angle ligament, to the pubic tubercle and to the conjoined tendon and internal oblique with interrupted #1 Novafil.  The slit was closed around the cord as well with #1 Novafil.  This laid nice and flat.  There is no undue tension and no undue constriction around the cord.Ilioinguinal nerve was divided to prevent entrapment.  Hemostasis achieved.  No undue tension noted.  The external bleak was closed with 2-0 Vicryl.  3-0 Vicryl was used to approximate Scarpa's fascia and 4-0 Monocryl was used to close the skin in a subcuticular fashion.  Dermabond applied.  All counts found to be correct.  The patient was awoke extubated taken to recovery in satisfactory condition.

## 2021-02-07 NOTE — Interval H&P Note (Signed)
History and Physical Interval Note:  02/07/2021 12:38 PM  Bryan Cowan  has presented today for surgery, with the diagnosis of RIGHT INGUINAL HERNIA.  The various methods of treatment have been discussed with the patient and family. After consideration of risks, benefits and other options for treatment, the patient has consented to  Procedure(s): RIGHT INGUINAL HERNIA REPAIR WITH MESH (Right) as a surgical intervention.  The patient's history has been reviewed, patient examined, no change in status, stable for surgery.  I have reviewed the patient's chart and labs.  Questions were answered to the patient's satisfaction.     [place]

## 2021-02-07 NOTE — Anesthesia Procedure Notes (Signed)
Procedure Name: Intubation Date/Time: 02/07/2021 1:08 PM Performed by: Bufford Spikes, CRNA Pre-anesthesia Checklist: Patient identified, Emergency Drugs available, Suction available and Patient being monitored Patient Re-evaluated:Patient Re-evaluated prior to induction Oxygen Delivery Method: Circle system utilized Preoxygenation: Pre-oxygenation with 100% oxygen Induction Type: IV induction Ventilation: Mask ventilation without difficulty Laryngoscope Size: Miller and 2 Grade View: Grade II Tube type: Oral Tube size: 7.0 mm Number of attempts: 1 Airway Equipment and Method: Stylet Placement Confirmation: ETT inserted through vocal cords under direct vision, positive ETCO2 and breath sounds checked- equal and bilateral Secured at: 22 cm Tube secured with: Tape Dental Injury: Teeth and Oropharynx as per pre-operative assessment

## 2021-02-07 NOTE — Anesthesia Postprocedure Evaluation (Signed)
Anesthesia Post Note  Patient: Bryan Cowan  Procedure(s) Performed: RIGHT INGUINAL HERNIA REPAIR WITH MESH (Right: Groin) INSERTION OF MESH (Right: Groin)     Patient location during evaluation: PACU Anesthesia Type: General Level of consciousness: awake and alert Pain management: pain level controlled Vital Signs Assessment: post-procedure vital signs reviewed and stable Respiratory status: spontaneous breathing, nonlabored ventilation and respiratory function stable Cardiovascular status: blood pressure returned to baseline and stable Postop Assessment: no apparent nausea or vomiting Anesthetic complications: no   No notable events documented.  Last Vitals:  Vitals:   02/07/21 1500 02/07/21 1512  BP: 135/77 133/73  Pulse: 72 73  Resp: 13 14  Temp:  36.6 C  SpO2: 97% 94%    Last Pain:  Vitals:   02/07/21 1512  TempSrc:   PainSc: 0-No pain                 Lynda Rainwater

## 2021-02-07 NOTE — Discharge Instructions (Addendum)
[facility] _______[facility]  UMBILICAL OR INGUINAL HERNIA REPAIR: POST OP INSTRUCTIONS  Always review your discharge instruction sheet given to you by the facility where your surgery was performed. IF YOU HAVE DISABILITY OR FAMILY LEAVE FORMS, YOU MUST BRING THEM TO THE OFFICE FOR PROCESSING.   DO NOT GIVE THEM TO YOUR DOCTOR.  1. A  prescription for pain medication may be given to you upon discharge.  Take your pain medication as prescribed, if needed.  If narcotic pain medicine is not needed, then you may take acetaminophen (Tylenol) or ibuprofen (Advil) as needed. 2. Take your usually prescribed medications unless otherwise directed. If you need a refill on your pain medication, please contact your pharmacy.  They will contact our office to request authorization. Prescriptions will not be filled after 5 pm or on week-ends. 3. You should follow a light diet the first 24 hours after arrival home, such as soup and crackers, etc.  Be sure to include lots of fluids daily.  Resume your normal diet the day after surgery. 4.Most patients will experience some swelling and bruising around the umbilicus or in the groin and scrotum.  Ice packs and reclining will help.  Swelling and bruising can take several days to resolve.  6. It is common to experience some constipation if taking pain medication after surgery.  Increasing fluid intake and taking a stool softener (such as Colace) will usually help or prevent this problem from occurring.  A mild laxative (Milk of Magnesia or Miralax) should be taken according to package directions if there are no bowel movements after 48 hours. 7. Unless discharge instructions indicate otherwise, you may remove your bandages 24-48 hours after surgery, and you may shower at that time.  You may have steri-strips (small skin tapes) in place directly over the incision.  These strips should be left on the skin for 7-10 days.  If your surgeon used skin glue on the  incision, you may shower in 24 hours.  The glue will flake off over the next 2-3 weeks.  Any sutures or staples will be removed at the office during your follow-up visit. 8. ACTIVITIES:  You may resume regular (light) daily activities beginning the next day--such as daily self-care, walking, climbing stairs--gradually increasing activities as tolerated.  You may have sexual intercourse when it is comfortable.  Refrain from any heavy lifting or straining until approved by your doctor.  a.You may drive when you are no longer taking prescription pain medication, you can comfortably wear a seatbelt, and you can safely maneuver your car and apply brakes. b.RETURN TO WORK:   _____________________________________________  9.You should see your doctor in the office for a follow-up appointment approximately 2-3 weeks after your surgery.  Make sure that you call for this appointment within a day or two after you arrive home to insure a convenient appointment time. 10.OTHER INSTRUCTIONS: _________________________    _____________________________________  WHEN TO CALL YOUR DOCTOR: Fever over 101.0 Inability to urinate Nausea and/or vomiting Extreme swelling or bruising Continued bleeding from incision. Increased pain, redness, or drainage from the incision  The clinic staff is available to answer your questions during regular business hours.  Please don't hesitate to call and ask to speak to one of the nurses for clinical concerns.  If you have a medical emergency, go to the nearest emergency room or call 911.  A surgeon from [facility] is always on call at the hospital   [address], [address],   [place], [place]  [address] ?  [address], [place], [place]   [address] [phone] ? [phone] ? FAX [phone] Web site: [email or web address]   No Tylenol until 5:40 pm   Post Anesthesia Home Care Instructions  Activity: Get plenty of rest for the remainder of  the day. A responsible individual must stay with you for 24 hours following the procedure.  For the next 24 hours, DO NOT: -Drive a car -Paediatric nurse -Drink alcoholic beverages -Take any medication unless instructed by your physician -Make any legal decisions or sign important papers.  Meals: Start with liquid foods such as gelatin or soup. Progress to regular foods as tolerated. Avoid greasy, spicy, heavy foods. If nausea and/or vomiting occur, drink only clear liquids until the nausea and/or vomiting subsides. Call your physician if vomiting continues.  Special Instructions/Symptoms: Your throat may feel dry or sore from the anesthesia or the breathing tube placed in your throat during surgery. If this causes discomfort, gargle with warm salt water. The discomfort should disappear within 24 hours.  If you had a scopolamine patch placed behind your ear for the management of post- operative nausea and/or vomiting:  1. The medication in the patch is effective for 72 hours, after which it should be removed.  Wrap patch in a tissue and discard in the trash. Wash hands thoroughly with soap and water. 2. You may remove the patch earlier than 72 hours if you experience unpleasant side effects which may include dry mouth, dizziness or visual disturbances. 3. Avoid touching the patch. Wash your hands with soap and water after contact with the patch.

## 2021-02-07 NOTE — Transfer of Care (Signed)
Immediate Anesthesia Transfer of Care Note  Patient: Bryan Cowan  Procedure(s) Performed: RIGHT INGUINAL HERNIA REPAIR WITH MESH (Right: Groin) INSERTION OF MESH (Right: Groin)  Patient Location: PACU  Anesthesia Type:GA combined with regional for post-op pain  Level of Consciousness: awake, alert , oriented and patient cooperative  Airway & Oxygen Therapy: Patient Spontanous Breathing and Patient connected to nasal cannula oxygen  Post-op Assessment: Report given to RN, Post -op Vital signs reviewed and stable and Patient moving all extremities  Post vital signs: Reviewed and stable  Last Vitals:  Vitals Value Taken Time  BP 142/78 02/07/21 1421  Temp 36.9 C 02/07/21 1422  Pulse 87 02/07/21 1422  Resp 20 02/07/21 1422  SpO2 96 % 02/07/21 1422  Vitals shown include unvalidated device data.  Last Pain:  Vitals:   02/07/21 1138  TempSrc: Oral  PainSc: 0-No pain         Complications: No notable events documented.

## 2021-02-07 NOTE — Anesthesia Procedure Notes (Signed)
Anesthesia Regional Block: TAP block   Pre-Anesthetic Checklist: , timeout performed,  Correct Patient, Correct Site, Correct Laterality,  Correct Procedure, Correct Position, site marked,  Risks and benefits discussed,  Surgical consent,  Pre-op evaluation,  At surgeon's request and post-op pain management  Laterality: Right  Prep: chloraprep       Needles:  Injection technique: Single-shot  Needle Type: Stimiplex     Needle Length: 9cm  Needle Gauge: 21     Additional Needles:   Procedures:,,,, ultrasound used (permanent image in chart),,    Narrative:  Start time: 02/07/2021 12:33 PM End time: 02/07/2021 12:38 PM Injection made incrementally with aspirations every 5 mL.  Performed by: Personally  Anesthesiologist: Lynda Rainwater, MD

## 2021-02-07 NOTE — Progress Notes (Signed)
Assisted Dr. Sabra Heck with right, ultrasound guided, transabdominal plane block. Side rails up, monitors on throughout procedure. See vital signs in flow sheet. Tolerated Procedure well.

## 2021-02-07 NOTE — H&P (Signed)
History of Present Illness: Bryan Cowan is a 70 y.o. male who is seen today for evaluation of right inguinal hernia. He is developed more discomfort in his right groin especially during bowel movements. He states he notices a small bulge that pops and it pops out. Location is right groin. There is no radiation of pain from this area. The pain is mild to moderate lasting a few minutes and occurs twice a week or so. Its not limiting his lifestyle..  Review of Systems: A complete review of systems was obtained from the patient. I have reviewed this information and discussed as appropriate with the patient. See HPI as well for other ROS.    Medical History: Past Medical History:  Diagnosis Date   GERD (gastroesophageal reflux disease)   Glaucoma (increased eye pressure)   History of cancer   Hypertension   Pseudophakia of both eyes 08/19/2012   Retinal detachment 08/19/2012   Patient Active Problem List  Diagnosis   Hypertension   Pseudophakia of both eyes   H/O retinal detachment OD   Vision loss of right eye   Relative afferent pupillary defect OD   PVD (posterior vitreous detachment), both eyes   Lattice degeneration of peripheral retina   Past Surgical History:  Procedure Laterality Date   ARTHROPLASTY TOTAL KNEE Bilateral 02/19/2007   LENS EYE SURGERY Bilateral 02/18/2010   PROSTATOTOMY   REPAIR RETINAL DETACHMENT W/SCLERAL BUCKLE Right 07/20/2012    No Known Allergies  Current Outpatient Medications on File Prior to Visit  Medication Sig Dispense Refill   brimonidine (ALPHAGAN) 0.2 % ophthalmic solution Place 1 drop into the right eye 2 (two) times daily   ketorolac (ACULAR) 0.5 % ophthalmic solution Place 1 drop into the right eye 4 (four) times daily. 10 mL 30   losartan (COZAAR) 50 MG tablet Take 50 mg by mouth daily.   losartan-hydrochlorothiazide (HYZAAR) 100-12.5 mg tablet Take 1 tablet by mouth daily.   omega-3 fatty acids 1,000 mg capsule Take 1 g by mouth 2  (two) times daily.   prednisoLONE acetate (PRED FORTE) 1 % ophthalmic suspension One drop every 2 hours while awake for one week, then taper by one drop a week until 4 times a day then stay at that dose. 15 mL 4   No current facility-administered medications on file prior to visit.   Family History  Problem Relation Age of Onset   Skin cancer Mother   Cataracts Mother   Retinal detachment Mother   Glaucoma Neg Hx   Macular degeneration Neg Hx    Social History   Tobacco Use  Smoking Status Never  Smokeless Tobacco Not on file    Social History   Socioeconomic History   Marital status: Married  Tobacco Use   Smoking status: Never  Vaping Use   Vaping Use: Never used  Substance and Sexual Activity   Alcohol use: Yes   Drug use: Never   Objective:   Vitals:  01/05/21 0957  BP: (!) 142/80  Pulse: 103  Temp: 37.2 C (99 F)  SpO2: 98%  Weight: (!) 103.9 kg (229 lb)  Height: 185.4 cm (6\' 1" )   Body mass index is 30.21 kg/m.  Physical Exam Constitutional:  Appearance: Normal appearance.  HENT:  Head: Normocephalic.  Eyes:  Pupils: Pupils are equal, round, and reactive to light.  Cardiovascular:  Rate and Rhythm: Normal rate.  Pulmonary:  Effort: Pulmonary effort is normal.  Breath sounds: No stridor.  Abdominal:  General: Abdomen is flat.  Palpations: Abdomen is soft.  Hernia: A hernia is present. Hernia is present in the right inguinal area. There is no hernia in the left inguinal area.   Musculoskeletal:  General: Normal range of motion.  Cervical back: Normal range of motion.  Skin: General: Skin is warm and dry.  Neurological:  General: No focal deficit present.  Mental Status: He is alert and oriented to person, place, and time.  Psychiatric:  Mood and Affect: Mood normal.  Behavior: Behavior normal.     Assessment and Plan:  Diagnoses and all orders for this visit:  Non-recurrent unilateral inguinal hernia without obstruction or  gangrene Comments: right  3 cm x 3 cm  reducuble     Reducible right inguinal hernia. Discussed repair. Risks and benefits of repair reviewed. Expected recovery and long-term expectations reviewed. The use of mesh and the risk of chronic pain discussed. Risk of surgery include but not exclusive of bleeding, infection, hernia recurrence, numbness, nerve injury, blood vessel injury, injury to testicle, injury spermatic cord, injury to internal organs, anesthesia risk, cardiovascular risk, the need further treatments and/or procedures, DVT. He agrees to proceed with open repair of his right inguinal hernia.  No follow-ups on file.  Kennieth Francois, MD

## 2021-02-08 ENCOUNTER — Encounter (HOSPITAL_BASED_OUTPATIENT_CLINIC_OR_DEPARTMENT_OTHER): Payer: Self-pay | Admitting: Surgery

## 2021-02-08 NOTE — Addendum Note (Signed)
Addendum  created 02/08/21 0732 by Maryella Shivers, CRNA   Charge Capture section accepted

## 2021-02-16 DIAGNOSIS — K4090 Unilateral inguinal hernia, without obstruction or gangrene, not specified as recurrent: Secondary | ICD-10-CM | POA: Diagnosis not present

## 2021-02-16 DIAGNOSIS — G8918 Other acute postprocedural pain: Secondary | ICD-10-CM | POA: Diagnosis not present

## 2021-02-16 DIAGNOSIS — E785 Hyperlipidemia, unspecified: Secondary | ICD-10-CM | POA: Diagnosis not present

## 2021-02-16 DIAGNOSIS — I700 Atherosclerosis of aorta: Secondary | ICD-10-CM | POA: Diagnosis not present

## 2021-03-04 ENCOUNTER — Encounter: Payer: Self-pay | Admitting: Family Medicine

## 2021-03-04 ENCOUNTER — Encounter: Payer: Self-pay | Admitting: Pulmonary Disease

## 2021-03-27 DIAGNOSIS — H401113 Primary open-angle glaucoma, right eye, severe stage: Secondary | ICD-10-CM | POA: Diagnosis not present

## 2021-03-27 DIAGNOSIS — H35372 Puckering of macula, left eye: Secondary | ICD-10-CM | POA: Diagnosis not present

## 2021-03-27 DIAGNOSIS — H33001 Unspecified retinal detachment with retinal break, right eye: Secondary | ICD-10-CM | POA: Diagnosis not present

## 2021-03-27 DIAGNOSIS — H40022 Open angle with borderline findings, high risk, left eye: Secondary | ICD-10-CM | POA: Diagnosis not present

## 2021-03-27 DIAGNOSIS — Z961 Presence of intraocular lens: Secondary | ICD-10-CM | POA: Diagnosis not present

## 2021-04-10 ENCOUNTER — Ambulatory Visit (INDEPENDENT_AMBULATORY_CARE_PROVIDER_SITE_OTHER): Payer: Medicare Other

## 2021-04-10 DIAGNOSIS — Z0000 Encounter for general adult medical examination without abnormal findings: Secondary | ICD-10-CM | POA: Diagnosis not present

## 2021-04-10 NOTE — Progress Notes (Signed)
Subjective:   Bryan Cowan is a 71 y.o. male who presents for an Subsequent  Medicare Annual Wellness Visit.  Review of Systems     Cardiac Risk Factors include: advanced age (>11men, >38 women);male gender;hypertension     Objective:    Today's Vitals   There is no height or weight on file to calculate BMI.  Advanced Directives 04/10/2021 02/07/2021 02/29/2020 11/04/2019 10/29/2019 02/24/2019 02/04/2018  Does Patient Have a Medical Advance Directive? Yes No Yes Yes Yes Yes Yes  Type of Paramedic of [place];Living will - [place];Living will - [place];Living will [place];Living will [place];Living will  Does patient want to make changes to medical advance directive? - - - No - Guardian declined No - Patient declined No - Patient declined No - Patient declined  Copy of [place] in Chart? No - copy requested - No - copy requested - No - copy requested No - copy requested No - copy requested  Would patient like information on creating a medical advance directive? - No - Patient declined - No - Patient declined - - -    Current Medications (verified) Outpatient Encounter Medications as of 04/10/2021  Medication Sig   Ascorbic Acid (VITAMIN C) 1000 MG tablet Take 1,000 mg by mouth daily.   aspirin EC 81 MG tablet Take 81 mg by mouth daily. Swallow whole.   b complex vitamins tablet Take 1 tablet by mouth daily.   brimonidine (ALPHAGAN) 0.2 % ophthalmic solution Place 1 drop into the right eye 2 (two) times daily.   cholecalciferol (VITAMIN D3) 25 MCG (1000 UNIT) tablet Take 1,000 Units by mouth daily.   Coenzyme Q10 100 MG capsule Take 100 mg by mouth.    fluticasone-salmeterol (ADVAIR HFA) 115-21 MCG/ACT inhaler Inhale 2 puffs into the lungs 2 (two) times daily.   ibuprofen (ADVIL) 800 MG tablet Take 1 tablet (800 mg total) by mouth every 8 (eight) hours as needed.    magnesium 30 MG tablet Take 30 mg by mouth 2 (two) times daily.   MILK THISTLE PO Take by mouth.   nitroGLYCERIN (NITROSTAT) 0.4 MG SL tablet Place 1 tablet (0.4 mg total) under the tongue every 5 (five) minutes as needed for chest pain.   Omega-3 Fatty Acids (FISH OIL) 1000 MG CAPS Take by mouth.   oxyCODONE (OXY IR/ROXICODONE) 5 MG immediate release tablet Take 1 tablet (5 mg total) by mouth every 6 (six) hours as needed for severe pain.   pantoprazole (PROTONIX) 40 MG tablet Take 1 tablet (40 mg total) by mouth daily.   rosuvastatin (CRESTOR) 20 MG tablet Take 1 tablet (20 mg total) by mouth daily.   timolol (TIMOPTIC) 0.5 % ophthalmic solution timolol maleate 0.5 % eye drops  PLACE 1 DROP INTO BOTH EYES TWICE DAILY   No facility-administered encounter medications on file as of 04/10/2021.    Allergies (verified) Patient has no known allergies.   History: Past Medical History:  Diagnosis Date   Basal cell carcinoma 05/19/2017   behind left ear cx3 8fu   Coronary artery disease    History of retinal detachment    Hyperlipidemia    Prostate cancer [facility])    Past Surgical History:  Procedure Laterality Date   BASAL CELL CARCINOMA EXCISION  2019   COLONOSCOPY  2013   INGUINAL HERNIA REPAIR Left 11/04/2019   Procedure: LEFT INGUINAL HERNIA REPAIR WITH MESH;  Surgeon: Brantley Stage,  [name], MD;  Location: [facility];  Service: General;  Laterality: Left;   INGUINAL HERNIA REPAIR Right 02/07/2021   Procedure: RIGHT INGUINAL HERNIA REPAIR WITH MESH;  Surgeon: Erroll Luna, MD;  Location: [place];  Service: General;  Laterality: Right;   INSERTION OF MESH Left 11/04/2019   Procedure: INSERTION OF MESH;  Surgeon: Erroll Luna, MD;  Location: [place];  Service: General;  Laterality: Left;   INSERTION OF MESH Right 02/07/2021   Procedure: INSERTION OF MESH;  Surgeon: Erroll Luna, MD;  Location: [place];  Service:  General;  Laterality: Right;   PROSTATECTOMY  2004   RETINAL DETACHMENT SURGERY Right 2015   RETINAL LASER PROCEDURE  2017   SIGMOIDOSCOPY  2000   TOTAL KNEE ARTHROPLASTY Bilateral 2009   Family History  Problem Relation Age of Onset   COPD Mother    Cancer Father        Lung   Cancer Daughter        Melanoma   Cancer Paternal Uncle        Liver   Colon cancer Neg Hx    Esophageal cancer Neg Hx    Prostate cancer Neg Hx    Rectal cancer Neg Hx    Social History   Socioeconomic History   Marital status: Married    Spouse name: Not on file   Number of children: 3   Years of education: Not on file   Highest education level: Not on file  Occupational History   Occupation: Retired- HR Management  Tobacco Use   Smoking status: Never   Smokeless tobacco: Never  Vaping Use   Vaping Use: Never used  Substance and Sexual Activity   Alcohol use: Yes    Comment: Beer per day   Drug use: Never   Sexual activity: Yes  Other Topics Concern   Not on file  Social History Narrative   Moved here from [place], [place] in 05/2015.   Social Determinants of Health   Financial Resource Strain: Low Risk    Difficulty of Paying Living Expenses: Not hard at all  Food Insecurity: No Food Insecurity   Worried About Charity fundraiser in the Last Year: Never true   [place] in the Last Year: Never true  Transportation Needs: No Transportation Needs   Lack of Transportation (Medical): No   Lack of Transportation (Non-Medical): No  Physical Activity: Sufficiently Active   Days of Exercise per Week: 5 days   Minutes of Exercise per Session: 60 min  Stress: No Stress Concern Present   Feeling of Stress : Not at all  Social Connections: Moderately Isolated   Frequency of Communication with Friends and Family: Twice a week   Frequency of Social Gatherings with Friends and Family: Twice a week   Attends Religious Services: Never   Printmaker: No    Attends Music therapist: Never   Marital Status: Married    Tobacco Counseling Counseling given: Not Answered   Clinical Intake:  Pre-visit preparation completed: Yes  Pain : No/denies pain     Nutritional Risks: None Diabetes: No  How often do you need to have someone help you when you read instructions, pamphlets, or other written materials from your doctor or pharmacy?: 1 - Never What is the last grade level you completed in school?: masters  Diabetic?no   Interpreter Needed?: No  Information entered by :: L.Marlena Barbato,LPN  Activities of Daily Living In your present state of health, do you have any difficulty performing the following activities: 04/10/2021 02/07/2021  Hearing? N N  Vision? N N  Difficulty concentrating or making decisions? N N  Walking or climbing stairs? N N  Dressing or bathing? N N  Doing errands, shopping? N -  Preparing Food and eating ? N -  Using the Toilet? N -  In the past six months, have you accidently leaked urine? N -  Do you have problems with loss of bowel control? N -  Managing your Medications? N -  Managing your Finances? N -  Housekeeping or managing your Housekeeping? N -  Some recent data might be hidden    Patient Care Team: Haydee Salter, MD as PCP - General (Family Medicine) Jettie Booze, MD as PCP - Cardiology (Cardiology) Garrel Ridgel, [place] as Consulting Physician (Podiatry) Lavonna Monarch, MD as Consulting Physician (Dermatology) Zadie Rhine Clent Demark, MD as Consulting Physician (Ophthalmology) Warden Fillers, MD as Consulting Physician (Ophthalmology) Erroll Luna, MD as Consulting Physician (General Surgery)  Indicate any recent Medical Services you may have received from other than [facility] providers in the past year (date may be approximate).     Assessment:   This is a routine wellness examination for Bryan Cowan.  Hearing/Vision screen Vision Screening - Comments:: Annual  eye exams wears  glasses   Dietary issues and exercise activities discussed: Current Exercise Habits: Home exercise routine, Type of exercise: walking;strength training/weights, Time (Minutes): 30, Frequency (Times/Week): 5, Weekly Exercise (Minutes/Week): 150, Intensity: Mild, Exercise limited by: None identified   Goals Addressed   None    Depression Screen PHQ 2/9 Scores 04/10/2021 02/29/2020 02/08/2020 02/24/2019 02/04/2018  PHQ - 2 Score 0 0 0 0 0    Fall Risk Fall Risk  04/10/2021 02/29/2020 02/08/2020 02/24/2019 02/04/2018  Falls in the past year? 0 0 0 0 0  Number falls in past yr: 0 0 - - -  Injury with Fall? 0 0 - - -  Risk for fall due to : No Fall Risks - - - -  Follow up Falls evaluation completed Falls prevention discussed - Education provided;Falls prevention discussed Falls evaluation completed    FALL RISK PREVENTION PERTAINING TO THE HOME:  Any stairs in or around the home? No  If so, are there any without handrails? No  Home free of loose throw rugs in walkways, pet beds, electrical cords, etc? Yes  Adequate lighting in your home to reduce risk of falls? Yes   ASSISTIVE DEVICES UTILIZED TO PREVENT FALLS:  Life alert? No  Use of a cane, walker or w/c? No  Grab bars in the bathroom? Yes  Shower chair or bench in shower? Yes  Elevated toilet seat or a handicapped toilet? Yes    Cognitive Function:  Normal cognitive status assessed by direct observation by this Nurse Health Advisor. No abnormalities found.   MMSE - Mini Mental State Exam 02/04/2018  Orientation to time 5  Orientation to Place 5  Registration 3  Attention/ Calculation 5  Recall 2  Language- name 2 objects 2  Language- repeat 1  Language- follow 3 step command 3  Language- read & follow direction 1  Write a sentence 1  Copy design 1  Total score 29        Immunizations Immunization History  Administered Date(s) Administered   Fluad Quad(high Dose 65+) 12/02/2018, 11/27/2020   Influenza Split  12/03/2019   Influenza, High Dose Seasonal PF  11/19/2015, 01/13/2018   Moderna Sars-Covid-2 Vaccination 04/02/2019, 04/30/2019, 12/21/2019   Pneumococcal Conjugate-13 03/05/2016   Pneumococcal Polysaccharide-23 01/13/2018   Tdap 06/19/2015   Zoster, Live 02/28/2009    TDAP status: Up to date  Flu Vaccine status: Up to date  Pneumococcal vaccine status: Up to date  Covid-19 vaccine status: Completed vaccines  Qualifies for Shingles Vaccine? Yes   Zostavax completed No   Shingrix Completed?: No.    Education has been provided regarding the importance of this vaccine. Patient has been advised to call insurance company to determine out of pocket expense if they have not yet received this vaccine. Advised may also receive vaccine at local pharmacy or Health Dept. Verbalized acceptance and understanding.  Screening Tests Health Maintenance  Topic Date Due   Zoster Vaccines- Shingrix (1 of 2) Never done   COVID-19 Vaccine (4 - Booster for Moderna series) 02/15/2020   COLONOSCOPY (Pts 45-49yrs Insurance coverage will need to be confirmed)  02/07/2022   TETANUS/TDAP  06/18/2025   Pneumonia Vaccine 1+ Years old  Completed   INFLUENZA VACCINE  Completed   HPV VACCINES  Aged Out   Hepatitis C Screening  Discontinued    Health Maintenance  Health Maintenance Due  Topic Date Due   Zoster Vaccines- Shingrix (1 of 2) Never done   COVID-19 Vaccine (4 - Booster for Moderna series) 02/15/2020    Colorectal cancer screening: Type of screening: Colonoscopy. Completed 12/21/20220. Repeat every 3 years  Lung Cancer Screening: (Low Dose CT Chest recommended if Age 43-80 years, 30 pack-year currently smoking OR have quit w/in 15years.) does not qualify.   Lung Cancer Screening Referral: n/a  Additional Screening:  Hepatitis C Screening: does qualify;   Vision Screening: Recommended annual ophthalmology exams for early detection of glaucoma and other disorders of the eye. Is the patient  up to date with their annual eye exam?  Yes  Who is the provider or what is the name of the office in which the patient attends annual eye exams? Dr.Groat  If pt is not established with a provider, would they like to be referred to a provider to establish care? No .   Dental Screening: Recommended annual dental exams for proper oral hygiene  Community Resource Referral / Chronic Care Management: CRR required this visit?  No   CCM required this visit?  No      Plan:     I have personally reviewed and noted the following in the patients chart:   Medical and social history Use of alcohol, tobacco or illicit drugs  Current medications and supplements including opioid prescriptions. Patient is not currently taking opioid prescriptions. Functional ability and status Nutritional status Physical activity Advanced directives List of other physicians Hospitalizations, surgeries, and ER visits in previous 12 months Vitals Screenings to include cognitive, depression, and falls Referrals and appointments  In addition, I have reviewed and discussed with patient certain preventive protocols, quality metrics, and best practice recommendations. A written personalized care plan for preventive services as well as general preventive health recommendations were provided to patient.     Randel Pigg, LPN   3/54/5625   Nurse Notes: none

## 2021-04-10 NOTE — Patient Instructions (Signed)
Bryan Cowan , Thank you for taking time to come for your Medicare Wellness Visit. I appreciate your ongoing commitment to your health goals. Please review the following plan we discussed and let me know if I can assist you in the future.   Screening recommendations/referrals: Colonoscopy: 02/08/2019  due 2023 Recommended yearly ophthalmology/optometry visit for glaucoma screening and checkup Recommended yearly dental visit for hygiene and checkup  Vaccinations: Influenza vaccine: completed  Pneumococcal vaccine: completed  Tdap vaccine: 06/29/2015 Shingles vaccine: will consider     Advanced directives: yes   Conditions/risks identified: none   Next appointment: none   Preventive Care 51 Years and Older, Male Preventive care refers to lifestyle choices and visits with your health care provider that can promote health and wellness. What does preventive care include? A yearly physical exam. This is also called an annual well check. Dental exams once or twice a year. Routine eye exams. Ask your health care provider how often you should have your eyes checked. Personal lifestyle choices, including: Daily care of your teeth and gums. Regular physical activity. Eating a healthy diet. Avoiding tobacco and drug use. Limiting alcohol use. Practicing safe sex. Taking low doses of aspirin every day. Taking vitamin and mineral supplements as recommended by your health care provider. What happens during an annual well check? The services and screenings done by your health care provider during your annual well check will depend on your age, overall health, lifestyle risk factors, and family history of disease. Counseling  Your health care provider may ask you questions about your: Alcohol use. Tobacco use. Drug use. Emotional well-being. Home and relationship well-being. Sexual activity. Eating habits. History of falls. Memory and ability to understand (cognition). Work and work  Statistician. Screening  You may have the following tests or measurements: Height, weight, and BMI. Blood pressure. Lipid and cholesterol levels. These may be checked every 5 years, or more frequently if you are over 54 years old. Skin check. Lung cancer screening. You may have this screening every year starting at age 81 if you have a 30-pack-year history of smoking and currently smoke or have quit within the past 15 years. Fecal occult blood test (FOBT) of the stool. You may have this test every year starting at age 78. Flexible sigmoidoscopy or colonoscopy. You may have a sigmoidoscopy every 5 years or a colonoscopy every 10 years starting at age 72. Prostate cancer screening. Recommendations will vary depending on your family history and other risks. Hepatitis C blood test. Hepatitis B blood test. Sexually transmitted disease (STD) testing. Diabetes screening. This is done by checking your blood sugar (glucose) after you have not eaten for a while (fasting). You may have this done every 1-3 years. Abdominal aortic aneurysm (AAA) screening. You may need this if you are a current or former smoker. Osteoporosis. You may be screened starting at age 38 if you are at high risk. Talk with your health care provider about your test results, treatment options, and if necessary, the need for more tests. Vaccines  Your health care provider may recommend certain vaccines, such as: Influenza vaccine. This is recommended every year. Tetanus, diphtheria, and acellular pertussis (Tdap, Td) vaccine. You may need a Td booster every 10 years. Zoster vaccine. You may need this after age 39. Pneumococcal 13-valent conjugate (PCV13) vaccine. One dose is recommended after age 9. Pneumococcal polysaccharide (PPSV23) vaccine. One dose is recommended after age 37. Talk to your health care provider about which screenings and vaccines you need and  how often you need them. This information is not intended to replace  advice given to you by your health care provider. Make sure you discuss any questions you have with your health care provider. Document Released: 03/03/2015 Document Revised: 10/25/2015 Document Reviewed: 12/06/2014 Elsevier Interactive Patient Education  2017 [place] Prevention in the Home Falls can cause injuries. They can happen to people of all ages. There are many things you can do to make your home safe and to help prevent falls. What can I do on the outside of my home? Regularly fix the edges of walkways and driveways and fix any cracks. Remove anything that might make you trip as you walk through a door, such as a raised step or threshold. Trim any bushes or trees on the path to your home. Use bright outdoor lighting. Clear any walking paths of anything that might make someone trip, such as rocks or tools. Regularly check to see if handrails are loose or broken. Make sure that both sides of any steps have handrails. Any raised decks and porches should have guardrails on the edges. Have any leaves, snow, or ice cleared regularly. Use sand or salt on walking paths during winter. Clean up any spills in your garage right away. This includes oil or grease spills. What can I do in the bathroom? Use night lights. Install grab bars by the toilet and in the tub and shower. Do not use towel bars as grab bars. Use non-skid mats or decals in the tub or shower. If you need to sit down in the shower, use a plastic, non-slip stool. Keep the floor dry. Clean up any water that spills on the floor as soon as it happens. Remove soap buildup in the tub or shower regularly. Attach bath mats securely with double-sided non-slip rug tape. Do not have throw rugs and other things on the floor that can make you trip. What can I do in the bedroom? Use night lights. Make sure that you have a light by your bed that is easy to reach. Do not use any sheets or blankets that are too big for your bed.  They should not hang down onto the floor. Have a firm chair that has side arms. You can use this for support while you get dressed. Do not have throw rugs and other things on the floor that can make you trip. What can I do in the kitchen? Clean up any spills right away. Avoid walking on wet floors. Keep items that you use a lot in easy-to-reach places. If you need to reach something above you, use a strong step stool that has a grab bar. Keep electrical cords out of the way. Do not use floor polish or wax that makes floors slippery. If you must use wax, use non-skid floor wax. Do not have throw rugs and other things on the floor that can make you trip. What can I do with my stairs? Do not leave any items on the stairs. Make sure that there are handrails on both sides of the stairs and use them. Fix handrails that are broken or loose. Make sure that handrails are as long as the stairways. Check any carpeting to make sure that it is firmly attached to the stairs. Fix any carpet that is loose or worn. Avoid having throw rugs at the top or bottom of the stairs. If you do have throw rugs, attach them to the floor with carpet tape. Make sure that you have  a light switch at the top of the stairs and the bottom of the stairs. If you do not have them, ask someone to add them for you. What else can I do to help prevent falls? Wear shoes that: Do not have high heels. Have rubber bottoms. Are comfortable and fit you well. Are closed at the toe. Do not wear sandals. If you use a stepladder: Make sure that it is fully opened. Do not climb a closed stepladder. Make sure that both sides of the stepladder are locked into place. Ask someone to hold it for you, if possible. Clearly mark and make sure that you can see: Any grab bars or handrails. First and last steps. Where the edge of each step is. Use tools that help you move around (mobility aids) if they are needed. These  include: Canes. Walkers. Scooters. Crutches. Turn on the lights when you go into a dark area. Replace any light bulbs as soon as they burn out. Set up your furniture so you have a clear path. Avoid moving your furniture around. If any of your floors are uneven, fix them. If there are any pets around you, be aware of where they are. Review your medicines with your doctor. Some medicines can make you feel dizzy. This can increase your chance of falling. Ask your doctor what other things that you can do to help prevent falls. This information is not intended to replace advice given to you by your health care provider. Make sure you discuss any questions you have with your health care provider. Document Released: 12/01/2008 Document Revised: 07/13/2015 Document Reviewed: 03/11/2014 Elsevier Interactive Patient Education  2017 [employer].

## 2021-06-05 ENCOUNTER — Ambulatory Visit (INDEPENDENT_AMBULATORY_CARE_PROVIDER_SITE_OTHER): Payer: Medicare Other | Admitting: Pulmonary Disease

## 2021-06-05 ENCOUNTER — Encounter: Payer: Self-pay | Admitting: Pulmonary Disease

## 2021-06-05 VITALS — BP 117/70 | HR 78 | Temp 97.6°F

## 2021-06-05 DIAGNOSIS — K219 Gastro-esophageal reflux disease without esophagitis: Secondary | ICD-10-CM

## 2021-06-05 DIAGNOSIS — J449 Chronic obstructive pulmonary disease, unspecified: Secondary | ICD-10-CM

## 2021-06-05 MED ORDER — ADVAIR HFA 45-21 MCG/ACT IN AERO: 2.0000 | INHALATION_SPRAY | Freq: Two times a day (BID) | RESPIRATORY_TRACT | 12 refills | Status: DC

## 2021-06-05 NOTE — Patient Instructions (Addendum)
Start advair 45-32mg 2 puffs twice daily (reduced dose from 115-252m) ?- rinse mouth out after each use ?- use reduced dose inhaler for 1 month and monitor symptoms ?- Can stop using inhaler in June if doing well and use it as needed.  ? ?Follow up 3 months ?

## 2021-06-05 NOTE — Progress Notes (Signed)
? ?Synopsis: Referred in November 2022 for abnormal chest x-ray by Arlester Marker, MD ? ?Subjective:  ? ?PATIENT ID: Bryan Cowan GENDER: male DOB: 01-Aug-1950, MRN: [id number] ? ?HPI ? ?Chief Complaint  ?Patient presents with  ? Follow-up  ?  Good improvement from inhaler. No SOB, wheezing or coughing.  ? ?Dozier Berkovich is a 71 year old man, never smoker with history of prostate cancer and hyperlipidemia who returns to pulmonary clinic for bronchitis and COPD. ? ?He was started on advair 115-67mg 2 puffs twice daily at last visit and has noticed a significant improvement in his breathing and cough. ? ?He has stopped taking PPI and done well from that standpoint. He is sleeping with head of bed elevated.  ? ?OV 02/05/21 ?Patient was started on pantoprazole 40 mg daily for history of reflux which she notes improvement in his cough and wheezing since starting this medication.  He has not started ICS/LABA inhaler therapy with Advair as he is waiting for his insurance to change over in the new year. ? ?Pulmonary function tests show mild obstructive defect with normal TLC and normal DLCO. ? ?HRCT chest shows no significant pulmonary parenchymal findings.  There are scattered airways with mild bronchial thickening. ? ?OV 12/20/20 ?Patient reports developing shortness of breath, cough with clear sputum production and intermittent wheezing over the past 3 to 6 months.  He had a chest radiograph on 11/27/2020 which showed calcified granuloma of the right upper lobe and calcified right hilar lymph node along with scattered peribronchial thickening. ? ?He is a never smoker but reports being exposed to secondhand smoke exposure from both his parents in childhood.  In the past he has had issues with seasonal allergies but over recent years denies any symptoms from seasonal allergies.  He does report reflux symptoms on a regular basis. ? ?His wife has asked if the timolol eyedrops that he was started on 6 months ago could be  contributing to his respiratory symptoms. ? 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  He denies any significant dust or chemical exposures over the years. ? ?Past Medical History:  ?Diagnosis Date  ? Basal cell carcinoma 05/19/2017  ? behind left ear cx3 558f ? Coronary artery disease   ? History of retinal detachment   ? Hyperlipidemia   ? [place]  ?  ? ?Family History  ?Problem Relation Age of Onset  ? COPD Mother   ? Cancer Father   ?     Lung  ? Cancer Daughter   ?     Melanoma  ? Cancer Paternal Uncle   ?     Liver  ? Colon cancer Neg Hx   ? Esophageal cancer Neg Hx   ? Prostate cancer Neg Hx   ? Rectal cancer Neg Hx   ?  ? ?Social History  ? ?Socioeconomic History  ? Marital status: Married  ?  Spouse name: Not on file  ? Number of children: 3  ? Years of education: Not on file  ? Highest education level: Not on file  ?Occupational History  ? Occupation: Retired- HR Management  ?Tobacco Use  ? Smoking status: Never  ? Smokeless tobacco: Never  ?Vaping Use  ? Vaping Use: Never used  ?Substance and Sexual Activity  ? Alcohol use: Yes  ?  Comment: Beer per day  ? Drug use: Never  ? Sexual activity: Yes  ?Other Topics Concern  ?  Not on file  ?Social History Narrative  ? Moved here from [place], [place] in 05/2015.  ? ?Social Determinants of Health  ? ?Financial Resource Strain: Low Risk   ? Difficulty of Paying Living Expenses: Not hard at all  ?Food Insecurity: No Food Insecurity  ? Worried About Charity fundraiser in the Last Year: Never true  ? Ran Out of Food in the Last Year: Never true  ?Transportation Needs: No Transportation Needs  ? Lack of Transportation (Medical): No  ? Lack of Transportation (Non-Medical): No  ?Physical Activity: Sufficiently Active  ? Days of Exercise per Week: 5 days  ? Minutes of Exercise per Session: 60 min  ?Stress: No Stress Concern Present  ? Feeling of Stress : Not at all  ?Social Connections: Moderately Isolated  ?  Frequency of Communication with Friends and Family: Twice a week  ? Frequency of Social Gatherings with Friends and Family: Twice a week  ? Attends Religious Services: Never  ? Active Member of Clubs or Organizations: No  ? Attends Archivist Meetings: Never  ? Marital Status: Married  ?Intimate Partner Violence: Not At Risk  ? Fear of Current or Ex-Partner: No  ? Emotionally Abused: No  ? Physically Abused: No  ? Sexually Abused: No  ?  ? ?No Known Allergies  ? ?Outpatient Medications Prior to Visit  ?Medication Sig Dispense Refill  ? Ascorbic Acid (VITAMIN C) 1000 MG tablet Take 1,000 mg by mouth daily.    ? aspirin EC 81 MG tablet Take 81 mg by mouth daily. Swallow whole.    ? b complex vitamins tablet Take 1 tablet by mouth daily.    ? brimonidine (ALPHAGAN) 0.2 % ophthalmic solution Place 1 drop into the right eye 2 (two) times daily.    ? cholecalciferol (VITAMIN D3) 25 MCG (1000 UNIT) tablet Take 1,000 Units by mouth daily.    ? Coenzyme Q10 100 MG capsule Take 100 mg by mouth.     ? magnesium 30 MG tablet Take 30 mg by mouth 2 (two) times daily.    ? MILK THISTLE PO Take by mouth.    ? nitroGLYCERIN (NITROSTAT) 0.4 MG SL tablet Place 1 tablet (0.4 mg total) under the tongue every 5 (five) minutes as needed for chest pain. 25 tablet 6  ? Omega-3 Fatty Acids (FISH OIL) 1000 MG CAPS Take by mouth.    ? rosuvastatin (CRESTOR) 20 MG tablet Take 1 tablet (20 mg total) by mouth daily. 90 tablet 3  ? timolol (TIMOPTIC) 0.5 % ophthalmic solution timolol maleate 0.5 % eye drops ? PLACE 1 DROP INTO BOTH EYES TWICE DAILY    ? fluticasone-salmeterol (ADVAIR HFA) 115-21 MCG/ACT inhaler Inhale 2 puffs into the lungs 2 (two) times daily. 1 each 6  ? ibuprofen (ADVIL) 800 MG tablet Take 1 tablet (800 mg total) by mouth every 8 (eight) hours as needed. 30 tablet 0  ? oxyCODONE (OXY IR/ROXICODONE) 5 MG immediate release tablet Take 1 tablet (5 mg total) by mouth every 6 (six) hours as needed for severe pain. 15  tablet 0  ? pantoprazole (PROTONIX) 40 MG tablet Take 1 tablet (40 mg total) by mouth daily. 30 tablet 6  ? ?No facility-administered medications prior to visit.  ? ?Review of Systems  ?Constitutional:  Negative for chills, fever, malaise/fatigue and weight loss.  ?HENT:  Negative for congestion, sinus pain and sore throat.   ?Eyes: Negative.   ?Respiratory:  Negative for cough, hemoptysis, sputum production,  shortness of breath and wheezing.   ?Cardiovascular:  Negative for chest pain, palpitations, orthopnea, claudication and leg swelling.  ?Gastrointestinal:  Negative for abdominal pain, heartburn, nausea and vomiting.  ?Genitourinary: Negative.   ?Musculoskeletal:  Negative for joint pain and myalgias.  ?Skin:  Negative for rash.  ?Neurological:  Negative for weakness.  ?Endo/Heme/Allergies: Negative.   ?Psychiatric/Behavioral: Negative.    ? ?Objective:  ? ?Vitals:  ? 06/05/21 0849  ?BP: 117/70  ?Pulse: 78  ?Temp: 97.6 ?F (36.4 ?C)  ?TempSrc: Oral  ?SpO2: 99%  ? ?Physical Exam ?Constitutional:   ?   General: He is not in acute distress. ?HENT:  ?   Head: Normocephalic and atraumatic.  ?Eyes:  ?   Conjunctiva/sclera: Conjunctivae normal.  ?Cardiovascular:  ?   Rate and Rhythm: Normal rate and regular rhythm.  ?   Pulses: Normal pulses.  ?   Heart sounds: Normal heart sounds. No murmur heard. ?Pulmonary:  ?   Effort: Pulmonary effort is normal.  ?   Breath sounds: Normal breath sounds.  ?Musculoskeletal:  ?   Right lower leg: No edema.  ?   Left lower leg: No edema.  ?Lymphadenopathy:  ?   Cervical: No cervical adenopathy.  ?Skin: ?   General: Skin is warm and dry.  ?Neurological:  ?   General: No focal deficit present.  ?   Mental Status: He is alert.  ?Psychiatric:     ?   Mood and Affect: Mood normal.     ?   Behavior: Behavior normal.     ?   Thought Content: Thought content normal.     ?   Judgment: Judgment normal.  ? ?CBC ?   ?Component Value Date/Time  ? WBC 6.0 11/01/2019 0939  ? RBC 5.19 11/01/2019 0939   ? HGB 13.8 11/01/2019 0939  ? HCT 43.5 11/01/2019 0939  ? PLT 216 11/01/2019 0939  ? MCV 83.8 11/01/2019 0939  ? MCH 26.6 11/01/2019 0939  ? MCHC 31.7 11/01/2019 0939  ? RDW 13.5 11/01/2019 0939  ? LYMPHSABS 2.0 11/01/18

## 2021-07-24 ENCOUNTER — Ambulatory Visit
Admission: RE | Admit: 2021-07-24 | Discharge: 2021-07-24 | Disposition: A | Discharge status: Home / Self Care | Payer: Medicare Other | Source: Ambulatory Visit | Attending: Interventional Cardiology | Admitting: Interventional Cardiology

## 2021-07-24 DIAGNOSIS — I2510 Atherosclerotic heart disease of native coronary artery without angina pectoris: Secondary | ICD-10-CM | POA: Diagnosis not present

## 2021-07-24 DIAGNOSIS — I898 Other specified noninfective disorders of lymphatic vessels and lymph nodes: Secondary | ICD-10-CM | POA: Diagnosis not present

## 2021-07-24 DIAGNOSIS — I77819 Aortic ectasia, unspecified site: Secondary | ICD-10-CM

## 2021-07-24 DIAGNOSIS — J984 Other disorders of lung: Secondary | ICD-10-CM | POA: Diagnosis not present

## 2021-07-24 DIAGNOSIS — I712 Thoracic aortic aneurysm, without rupture, unspecified: Secondary | ICD-10-CM | POA: Diagnosis not present

## 2021-07-24 MED ORDER — IOPAMIDOL (ISOVUE-370) INJECTION 76%
75.0000 mL | Freq: Once | INTRAVENOUS | Status: AC | PRN
  Administered 2021-07-24: 75 mL via INTRAVENOUS

## 2021-07-30 DIAGNOSIS — H401113 Primary open-angle glaucoma, right eye, severe stage: Secondary | ICD-10-CM | POA: Diagnosis not present

## 2021-07-30 DIAGNOSIS — H40022 Open angle with borderline findings, high risk, left eye: Secondary | ICD-10-CM | POA: Diagnosis not present

## 2021-07-30 DIAGNOSIS — H35372 Puckering of macula, left eye: Secondary | ICD-10-CM | POA: Diagnosis not present

## 2021-07-30 DIAGNOSIS — H33001 Unspecified retinal detachment with retinal break, right eye: Secondary | ICD-10-CM | POA: Diagnosis not present

## 2021-07-30 DIAGNOSIS — Z961 Presence of intraocular lens: Secondary | ICD-10-CM | POA: Diagnosis not present

## 2021-07-31 NOTE — Progress Notes (Unsigned)
Cardiology Office Note   Date:  08/01/2021   ID:  Bryan Cowan, DOB November 05, 1950, MRN [id number]  PCP:  Haydee Salter, MD    Chief Complaint  Patient presents with   Follow-up   CAD  Wt Readings from Last 3 Encounters:  08/01/21 212 lb (96.2 kg)  02/07/21 222 lb 14.2 oz (101.1 kg)  02/05/21 225 lb (102.1 kg)       History of Present Illness: Bryan Cowan is a 71 y.o. male  who I saw for dyspnea on exertion in November 2022.  He had a coronary CTA showing: "Aortic Valve:  Trileaflet.  Mild calcifications.   Coronary Arteries:  Normal coronary origin.  Right dominance.   RCA is a large dominant artery that gives rise to PDA and PLVB. There is mild (25-49%) calcified plaque in the proximal and mid RCA with minimal (<25%) calcified plaque distally.   Left main is a large artery that gives rise to LAD, RI, and LCX arteries.   LAD is a large vessel that has scattered mild (25-49%) calcified plaque. D1 is a tiny vessel with minimal (<25%) calcified plaque.   RI is a small (1.6 mm) vessel with mild (25-49%) calcified plaque.   LCX is a non-dominant artery that gives rise to a tiny OM1 vessel with mild (25-49%) calcified plaque. There is severe (>70%) mixed plaque in the mid LCX."   CT FFR in the OM1 was significant.   In 01/2021: "He continues to exercise regularly.  He denies any chest discomfort.  He has chronic cough for which he is seeing pulmonary.  He has not had to stop exercising or stop walking at any point because of discomfort." He was planning hernia surgery in January 2023.  Everything went well.   Denies : Chest pain. Dizziness. Leg edema. Nitroglycerin use. Orthopnea. Palpitations. Paroxysmal nocturnal dyspnea. Shortness of breath. Syncope.    He had 1 report from his Apple Watch that mention atrial fibrillation.  I reviewed this and it appears to just be artifact.  Past Medical History:  Diagnosis Date   Basal cell carcinoma 05/19/2017   behind left  ear cx3 34f   Coronary artery disease    History of retinal detachment    Hyperlipidemia    Prostate cancer ([facility]     Past Surgical History:  Procedure Laterality Date   BASAL CELL CARCINOMA EXCISION  2019   COLONOSCOPY  2013   INGUINAL HERNIA REPAIR Left 11/04/2019   Procedure: LEFT INGUINAL HERNIA REPAIR WITH MESH;  Surgeon: CErroll Luna MD;  [facility] [facility]  Service: General;  Laterality: Left;   INGUINAL HERNIA REPAIR Right 02/07/2021   Procedure: RIGHT INGUINAL HERNIA REPAIR WITH MESH;  Surgeon: CErroll Luna MD;  [place] [facility]  Service: General;  Laterality: Right;   INSERTION OF MESH Left 11/04/2019   Procedure: INSERTION OF MESH;  Surgeon: CErroll Luna MD;  [place] [facility]  Service: General;  Laterality: Left;   INSERTION OF MESH Right 02/07/2021   Procedure: INSERTION OF MESH;  Surgeon: CErroll Luna MD;  [facility] [facility]  Service: General;  Laterality: Right;   PROSTATECTOMY  2004   RETINAL DETACHMENT SURGERY Right 2015   RETINAL LASER PROCEDURE  2017   SIGMOIDOSCOPY  2000   TOTAL KNEE ARTHROPLASTY Bilateral 2009     Current Outpatient Medications  Medication Sig Dispense Refill   Ascorbic Acid (VITAMIN C) 1000 MG tablet Take 1,000 mg by mouth  daily.     aspirin EC 81 MG tablet Take 81 mg by mouth daily. Swallow whole.     b complex vitamins tablet Take 1 tablet by mouth daily.     brimonidine (ALPHAGAN) 0.2 % ophthalmic solution Place 1 drop into the right eye 2 (two) times daily.     cholecalciferol (VITAMIN D3) 25 MCG (1000 UNIT) tablet Take 1,000 Units by mouth daily.     Coenzyme Q10 100 MG capsule Take 100 mg by mouth.      fluticasone-salmeterol (ADVAIR HFA) 45-21 MCG/ACT inhaler Inhale 2 puffs into the lungs 2 (two) times daily. 1 each 12   latanoprost (XALATAN) 0.005 % ophthalmic solution Place 1 drop into both eyes at bedtime.     magnesium 30 MG tablet Take 30  mg by mouth daily.     MILK THISTLE PO Take by mouth daily.     nitroGLYCERIN (NITROSTAT) 0.4 MG SL tablet Place 1 tablet (0.4 mg total) under the tongue every 5 (five) minutes as needed for chest pain. 25 tablet 6   Omega-3 Fatty Acids (FISH OIL) 1000 MG CAPS Take by mouth daily.     timolol (TIMOPTIC) 0.5 % ophthalmic solution timolol maleate 0.5 % eye drops  PLACE 1 DROP INTO BOTH EYES TWICE DAILY     No current facility-administered medications for this visit.    Allergies:   Patient has no known allergies.    Social History:  The patient  reports that he has never smoked. He has never used smokeless tobacco. He reports current alcohol use. He reports that he does not use drugs.   Family History:  The patient's family history includes COPD in his mother; Cancer in his daughter, father, and paternal uncle.    ROS:  Please see the history of present illness.   Otherwise, review of systems are positive for low HR by Apple watch.   All other systems are reviewed and negative.    PHYSICAL EXAM: VS:  BP 124/66   Pulse 78   Ht '6\' 1"'$  (1.854 m)   Wt 212 lb (96.2 kg)   SpO2 96%   BMI 27.97 kg/m  , BMI Body mass index is 27.97 kg/m. GEN: Well nourished, well developed, in no acute distress HEENT: normal Neck: no JVD, carotid bruits, or masses Cardiac: RRR, premature beats; no murmurs, rubs, or gallops,no edema  Respiratory:  clear to auscultation bilaterally, normal work of breathing GI: soft, nontender, nondistended, + BS MS: no deformity or atrophy Skin: warm and dry, no rash Neuro:  Strength and sensation are intact Psych: euthymic mood, full affect   EKG:   The ekg ordered today demonstrates NSR, PVC   Recent Labs: 01/17/2021: BUN 12; Creatinine, Ser 0.82; Potassium 4.2; Sodium 138   Lipid Panel    Component Value Date/Time   CHOL 240 (H) 11/27/2020 1017   TRIG 100.0 11/27/2020 1017   HDL 53.30 11/27/2020 1017   CHOLHDL 4 11/27/2020 1017   VLDL 20.0 11/27/2020  1017   LDLCALC 166 (H) 11/27/2020 1017     Other studies Reviewed: Additional studies/ records that were reviewed today with results demonstrating: Labs reviewed, LDL 166 in October 2022.  CT findings reviewed.   ASSESSMENT AND PLAN:  CAD: CTA showed significant OM lesion.  Angina had been controlled with medical therapy.   Hyperlipidemia: Did not want to start statin.  Thoracic aortic aneurysm: Stable in June 2023 at 41 mm.  Plan for scan in 1 year.  There were  some pulmonary findings noted. Bradycardia noted at times from his Apple Watch.  No lightheadedness or syncope.  Watch for any symptoms.  The bradycardia typically was at night.  No problems completing activities during the day.  Also, he had PVCs on ECG.  This could underestimate his pulse if checked from the radial approach.  He will continue to monitor.   Current medicines are reviewed at length with the patient today.  The patient concerns regarding his medicines were addressed.  The following changes have been made:  No change  Labs/ tests ordered today include: CMet lipids CRP  No orders of the defined types were placed in this encounter.   Recommend 150 minutes/week of aerobic exercise Low fat, low carb, high fiber diet recommended  Disposition:   FU in 1 year   Signed, Larae Grooms, MD  08/01/2021 8:38 AM    [facility] [address], [place], [place]  [address] Phone: [phone]; Fax: [phone]

## 2021-08-01 ENCOUNTER — Encounter: Payer: Self-pay | Admitting: Interventional Cardiology

## 2021-08-01 ENCOUNTER — Ambulatory Visit (INDEPENDENT_AMBULATORY_CARE_PROVIDER_SITE_OTHER): Payer: Medicare Other | Admitting: Interventional Cardiology

## 2021-08-01 VITALS — BP 124/66 | HR 78 | Ht 73.0 in | Wt 212.0 lb

## 2021-08-01 DIAGNOSIS — I2510 Atherosclerotic heart disease of native coronary artery without angina pectoris: Secondary | ICD-10-CM

## 2021-08-01 DIAGNOSIS — R7301 Impaired fasting glucose: Secondary | ICD-10-CM

## 2021-08-01 DIAGNOSIS — I77819 Aortic ectasia, unspecified site: Secondary | ICD-10-CM

## 2021-08-01 DIAGNOSIS — E782 Mixed hyperlipidemia: Secondary | ICD-10-CM | POA: Diagnosis not present

## 2021-08-01 DIAGNOSIS — I700 Atherosclerosis of aorta: Secondary | ICD-10-CM

## 2021-08-01 NOTE — Patient Instructions (Signed)
Medication Instructions:  Your physician recommends that you continue on your current medications as directed. Please refer to the Current Medication list given to you today.  *If you need a refill on your cardiac medications before your next appointment, please call your pharmacy*   Lab Work: Your physician recommends that you return for lab work on August 07, 2021.  The lab opens at 7:15 AM.  This will be fasting.  A1C, Lipoprotein A, CRP, CBC, CMET and lipids  If you have labs (blood work) drawn today and your tests are completely normal, you will receive your results only by: MyChart Message (if you have MyChart) OR A paper copy in the mail If you have any lab test that is abnormal or we need to change your treatment, we will call you to review the results.   Testing/Procedures: Non-Cardiac CT Angiography (CTA), is a special type of CT scan that uses a computer to produce multi-dimensional views of major blood vessels throughout the body. In CT angiography, a contrast material is injected through an IV to help visualize the blood vessels To be done in one year at [place]: At [facility], you and your health needs are our priority.  As part of our continuing mission to provide you with exceptional heart care, we have created designated Provider Care Teams.  These Care Teams include your primary Cardiologist (physician) and Advanced Practice Providers (APPs -  Physician Assistants and Nurse Practitioners) who all work together to provide you with the care you need, when you need it.  We recommend signing up for the patient portal called "MyChart".  Sign up information is provided on this After Visit Summary.  MyChart is used to connect with patients for Virtual Visits (Telemedicine).  Patients are able to view lab/test results, encounter notes, upcoming appointments, etc.  Non-urgent messages can be sent to your provider as well.   To learn more about  what you can do with MyChart, go to [email or web address].    Your next appointment:   12 month(s)  The format for your next appointment:   In Person  Provider:   Larae Grooms, MD     Other Instructions    Important Information About Sugar

## 2021-08-07 ENCOUNTER — Other Ambulatory Visit: Payer: Medicare Other

## 2021-08-07 DIAGNOSIS — I2510 Atherosclerotic heart disease of native coronary artery without angina pectoris: Secondary | ICD-10-CM | POA: Diagnosis not present

## 2021-08-07 DIAGNOSIS — I700 Atherosclerosis of aorta: Secondary | ICD-10-CM | POA: Diagnosis not present

## 2021-08-07 DIAGNOSIS — E782 Mixed hyperlipidemia: Secondary | ICD-10-CM

## 2021-08-07 DIAGNOSIS — I77819 Aortic ectasia, unspecified site: Secondary | ICD-10-CM | POA: Diagnosis not present

## 2021-08-07 DIAGNOSIS — R7301 Impaired fasting glucose: Secondary | ICD-10-CM

## 2021-08-08 ENCOUNTER — Telehealth: Payer: Self-pay | Admitting: *Deleted

## 2021-08-08 LAB — COMPREHENSIVE METABOLIC PANEL
ALT: 16 IU/L (ref 0–44)
AST: 26 IU/L (ref 0–40)
Albumin/Globulin Ratio: 2.6 — ABNORMAL HIGH (ref 1.2–2.2)
Albumin: 4.6 g/dL (ref 3.7–4.7)
Alkaline Phosphatase: 57 IU/L (ref 44–121)
BUN/Creatinine Ratio: 11 (ref 10–24)
BUN: 9 mg/dL (ref 8–27)
Bilirubin Total: 0.7 mg/dL (ref 0.0–1.2)
CO2: 26 mmol/L (ref 20–29)
Calcium: 9.4 mg/dL (ref 8.6–10.2)
Chloride: 100 mmol/L (ref 96–106)
Creatinine, Ser: 0.82 mg/dL (ref 0.76–1.27)
Globulin, Total: 1.8 g/dL (ref 1.5–4.5)
Glucose: 102 mg/dL — ABNORMAL HIGH (ref 70–99)
Potassium: 4.1 mmol/L (ref 3.5–5.2)
Sodium: 136 mmol/L (ref 134–144)
Total Protein: 6.4 g/dL (ref 6.0–8.5)
eGFR: 93 mL/min/{1.73_m2} (ref >59)

## 2021-08-08 LAB — CBC
Hematocrit: 42.9 % (ref 37.5–51.0)
Hemoglobin: 14 g/dL (ref 13.0–17.7)
MCH: 26.4 pg — ABNORMAL LOW (ref 26.6–33.0)
MCHC: 32.6 g/dL (ref 31.5–35.7)
MCV: 81 fL (ref 79–97)
Platelets: 230 10*3/uL (ref 150–450)
RBC: 5.31 x10E6/uL (ref 4.14–5.80)
RDW: 15.9 % — ABNORMAL HIGH (ref 11.6–15.4)
WBC: 4.9 10*3/uL (ref 3.4–10.8)

## 2021-08-08 LAB — LIPID PANEL
Chol/HDL Ratio: 4.7 ratio (ref 0.0–5.0)
Cholesterol, Total: 233 mg/dL — ABNORMAL HIGH (ref 100–199)
HDL: 50 mg/dL (ref >39)
LDL Chol Calc (NIH): 170 mg/dL — ABNORMAL HIGH (ref 0–99)
Triglycerides: 73 mg/dL (ref 0–149)
VLDL Cholesterol Cal: 13 mg/dL (ref 5–40)

## 2021-08-08 LAB — HEMOGLOBIN A1C
Est. average glucose Bld gHb Est-mCnc: 126 mg/dL
Hgb A1c MFr Bld: 6 % — ABNORMAL HIGH (ref 4.8–5.6)

## 2021-08-08 LAB — C-REACTIVE PROTEIN: CRP: <1 mg/L (ref 0–10)

## 2021-08-08 LAB — LIPOPROTEIN A (LPA): Lipoprotein (a): 162.3 nmol/L — ABNORMAL HIGH (ref <75.0)

## 2021-08-08 NOTE — Telephone Encounter (Signed)
-----   Message from Jettie Booze, MD sent at 08/08/2021 12:19 PM EDT ----- A1c consistent with prediabetes.  Healthy diet and regular exercise recommended.  LP(a) elevated as well which increases cardiovascular risk.  LDL 170.  CRP less than 1 indicating low level inflammation.  Would recommend statin therapy given abnormal CTA, elevated LDL and elevated LP(a). Rosuvastatin 20 mg daily with liver and lipids in 3 months.  He has declined in the past.

## 2021-08-08 NOTE — Telephone Encounter (Signed)
I spoke with patient and reviewed results/recommendations with him.  He does not want to start a statin at this time.

## 2021-09-03 ENCOUNTER — Encounter: Payer: Self-pay | Admitting: Pulmonary Disease

## 2021-09-05 ENCOUNTER — Other Ambulatory Visit: Payer: Self-pay | Admitting: Pulmonary Disease

## 2021-09-05 MED ORDER — FLUTICASONE-SALMETEROL 115-21 MCG/ACT IN AERO: 2.0000 | INHALATION_SPRAY | Freq: Two times a day (BID) | RESPIRATORY_TRACT | 6 refills | Status: DC

## 2021-09-05 NOTE — Telephone Encounter (Signed)
Prescription for Advair HFA 115 sent to pharmacy

## 2021-09-05 NOTE — Telephone Encounter (Signed)
With Dr. Erin Fulling being out of the office, routing this to provider of the day. Please advise if you are okay upping the dose of pt's Advair. Please see mychart messages sent.

## 2021-09-10 ENCOUNTER — Ambulatory Visit (INDEPENDENT_AMBULATORY_CARE_PROVIDER_SITE_OTHER): Payer: Medicare Other | Admitting: Pulmonary Disease

## 2021-09-10 ENCOUNTER — Encounter: Payer: Self-pay | Admitting: Pulmonary Disease

## 2021-09-10 VITALS — BP 100/70 | HR 75 | Temp 97.9°F | Ht 74.0 in | Wt 203.0 lb

## 2021-09-10 DIAGNOSIS — J449 Chronic obstructive pulmonary disease, unspecified: Secondary | ICD-10-CM

## 2021-09-10 DIAGNOSIS — B338 Other specified viral diseases: Secondary | ICD-10-CM | POA: Diagnosis not present

## 2021-09-10 MED ORDER — PREDNISONE 20 MG PO TABS: 40.0000 mg | ORAL_TABLET | Freq: Every day | ORAL | 0 refills | Status: DC

## 2021-09-10 NOTE — Patient Instructions (Addendum)
Start prednisone '40mg'$  daily for 5 days  Continue advair 177mg-21mcg 2 puffs twice daily -rinse mouth out after each use  Can start lower dose advair again if feeling better.   Start advair 45-245m 2 puffs twice daily (reduced dose from 115-2177m - rinse mouth out after each use - use reduced dose inhaler for 1 month and monitor symptoms  Follow up 6 months

## 2021-09-10 NOTE — Progress Notes (Addendum)
Synopsis: Referred in November 2022 for abnormal chest x-ray by Arlester Marker, MD  Subjective:   PATIENT ID: Bryan Cowan GENDER: male DOB: 08-Sep-1950, MRN: [id number]  HPI  Chief Complaint  Patient presents with   Follow-up    Follow-up SOB, Cough family had SRV   Bryan Cowan is a 71 year old man, never smoker with history of prostate cancer and hyperlipidemia who returns to pulmonary clinic for bronchitis and COPD.  Patient had RSV infection at beginning of month with increase in ???? Symptoms. He was resumed back on advair 115-20mg 2 puffs twice daily as he was tapered to advair 45-226m at last visit.  He will occasionally have a coughing fit still at night. No exertional dyspnea.   OV 05/2021 He was started on advair 115-2159m2 puffs twice daily at last visit and has noticed a significant improvement in his breathing and cough.  He has stopped taking PPI and done well from that standpoint. He is sleeping with head of bed elevated.   OV 02/05/21 Patient was started on pantoprazole 40 mg daily for history of reflux which she notes improvement in his cough and wheezing since starting this medication.  He has not started ICS/LABA inhaler therapy with Advair as he is waiting for his insurance to change over in the new year.  Pulmonary function tests show mild obstructive defect with normal TLC and normal DLCO.  HRCT chest shows no significant pulmonary parenchymal findings.  There are scattered airways with mild bronchial thickening.  OV 12/20/20 Patient reports developing shortness of breath, cough with clear sputum production and intermittent wheezing over the past 3 to 6 months.  He had a chest radiograph on 11/27/2020 which showed calcified granuloma of the right upper lobe and calcified right hilar lymph node along with scattered peribronchial thickening.  He is a never smoker but reports being exposed to secondhand smoke exposure from both his parents in childhood.  In the  past he has had issues with seasonal allergies but over recent years denies any symptoms from seasonal allergies.  He does report reflux symptoms on a regular basis.  His wife has asked if the timolol eyedrops that he was started on 6 months ago could be contributing to his respiratory symptoms.  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  He denies any significant dust or chemical exposures over the years.  Past Medical History:  Diagnosis Date   Basal cell carcinoma 05/19/2017   behind left ear [id number] artery disease    History of retinal detachment    Hyperlipidemia    Prostate cancer (HCC)[facility]   Family History  Problem Relation Age of Onset   COPD Mother    Cancer Father        Lung   Cancer Daughter        Melanoma   Cancer Paternal Uncle        Liver   Colon cancer Neg Hx    Esophageal cancer Neg Hx    Prostate cancer Neg Hx    Rectal cancer Neg Hx      Social History   Socioeconomic History   Marital status: Married    Spouse name: Not on file   Number of children: 3   Years of education: Not on file   Highest education level: Not on file  Occupational History   Occupation: Retired- HR Management  Tobacco Use  Smoking status: Never   Smokeless tobacco: Never  Vaping Use   Vaping Use: Never used  Substance and Sexual Activity   Alcohol use: Yes    Comment: Beer per day   Drug use: Never   Sexual activity: Yes  Other Topics Concern   Not on file  Social History Narrative   Moved here from [place], [place] in 05/2015.   Social Determinants of Health   Financial Resource Strain: Low Risk  (04/10/2021)   Overall Financial Resource Strain (CARDIA)    Difficulty of Paying Living Expenses: Not hard at all  Food Insecurity: No Food Insecurity (04/10/2021)   Hunger Vital Sign    Worried About Running Out of Food in the Last Year: Never true    Ran Out of Food in the Last Year: Never true  Transportation Needs:  No Transportation Needs (04/10/2021)   PRAPARE - Hydrologist (Medical): No    Lack of Transportation (Non-Medical): No  Physical Activity: Sufficiently Active (04/10/2021)   Exercise Vital Sign    Days of Exercise per Week: 5 days    Minutes of Exercise per Session: 60 min  Stress: No Stress Concern Present (04/10/2021)   [place]    Feeling of Stress : Not at all  Social Connections: Moderately Isolated (04/10/2021)   Social Connection and Isolation Panel [NHANES]    Frequency of Communication with Friends and Family: Twice a week    Frequency of Social Gatherings with Friends and Family: Twice a week    Attends Religious Services: Never    Marine scientist or Organizations: No    Attends Archivist Meetings: Never    Marital Status: Married  Human resources officer Violence: Not At Risk (04/10/2021)   Humiliation, Afraid, Rape, and Kick questionnaire    Fear of Current or Ex-Partner: No    Emotionally Abused: No    Physically Abused: No    Sexually Abused: No     No Known Allergies   Outpatient Medications Prior to Visit  Medication Sig Dispense Refill   fluticasone-salmeterol (ADVAIR HFA) 115-21 MCG/ACT inhaler Inhale 2 puffs into the lungs 2 (two) times daily. 1 each 6   Ascorbic Acid (VITAMIN C) 1000 MG tablet Take 1,000 mg by mouth daily.     aspirin EC 81 MG tablet Take 81 mg by mouth daily. Swallow whole.     b complex vitamins tablet Take 1 tablet by mouth daily.     brimonidine (ALPHAGAN) 0.2 % ophthalmic solution Place 1 drop into the right eye 2 (two) times daily.     cholecalciferol (VITAMIN D3) 25 MCG (1000 UNIT) tablet Take 1,000 Units by mouth daily.     Coenzyme Q10 100 MG capsule Take 100 mg by mouth.      latanoprost (XALATAN) 0.005 % ophthalmic solution Place 1 drop into both eyes at bedtime.     magnesium 30 MG tablet Take 30 mg by mouth daily.     MILK  THISTLE PO Take by mouth daily.     nitroGLYCERIN (NITROSTAT) 0.4 MG SL tablet Place 1 tablet (0.4 mg total) under the tongue every 5 (five) minutes as needed for chest pain. 25 tablet 6   Omega-3 Fatty Acids (FISH OIL) 1000 MG CAPS Take by mouth daily.     timolol (TIMOPTIC) 0.5 % ophthalmic solution timolol maleate 0.5 % eye drops  PLACE 1 DROP INTO BOTH EYES TWICE DAILY  No facility-administered medications prior to visit.   Review of Systems  Constitutional:  Negative for chills, fever, malaise/fatigue and weight loss.  HENT:  Negative for congestion, sinus pain and sore throat.   Eyes: Negative.   Respiratory:  Positive for cough. Negative for hemoptysis, sputum production, shortness of breath and wheezing.   Cardiovascular:  Negative for chest pain, palpitations, orthopnea, claudication and leg swelling.  Gastrointestinal:  Negative for abdominal pain, heartburn, nausea and vomiting.  Genitourinary: Negative.   Musculoskeletal:  Negative for joint pain and myalgias.  Skin:  Negative for rash.  Neurological:  Negative for weakness.  Endo/Heme/Allergies: Negative.   Psychiatric/Behavioral: Negative.      Objective:   Vitals:   09/10/21 0906  BP: 100/70  Pulse: 75  Temp: 97.9 F (36.6 C)  TempSrc: Oral  SpO2: 94%  Weight: 203 lb (92.1 kg)  Height: '6\' 2"'$  (1.88 m)   Physical Exam Constitutional:      General: He is not in acute distress. HENT:     Head: Normocephalic and atraumatic.  Eyes:     Conjunctiva/sclera: Conjunctivae normal.  Cardiovascular:     Rate and Rhythm: Normal rate and regular rhythm.     Pulses: Normal pulses.     Heart sounds: Normal heart sounds. No murmur heard. Pulmonary:     Effort: Pulmonary effort is normal.     Breath sounds: Rhonchi present.  Musculoskeletal:     Right lower leg: No edema.     Left lower leg: No edema.  Lymphadenopathy:     Cervical: No cervical adenopathy.  Skin:    General: Skin is warm and dry.  Neurological:      General: No focal deficit present.     Mental Status: He is alert.  Psychiatric:        Mood and Affect: Mood normal.        Behavior: Behavior normal.        Thought Content: Thought content normal.        Judgment: Judgment normal.    CBC    Component Value Date/Time   WBC 4.9 08/07/2021 0816   WBC 6.0 11/01/2019 0939   RBC 5.31 08/07/2021 0816   RBC 5.19 11/01/2019 0939   HGB 14.0 08/07/2021 0816   HCT 42.9 08/07/2021 0816   PLT 230 08/07/2021 0816   MCV 81 08/07/2021 0816   MCH 26.4 (L) 08/07/2021 0816   MCH 26.6 11/01/2019 0939   MCHC 32.6 08/07/2021 0816   MCHC 31.7 11/01/2019 0939   RDW 15.9 (H) 08/07/2021 0816   LYMPHSABS 2.0 11/01/2019 0939   MONOABS 0.6 11/01/2019 0939   EOSABS 0.5 11/01/2019 0939   BASOSABS 0.1 11/01/2019 0939      Latest Ref Rng & Units 08/07/2021    8:16 AM 01/17/2021    7:11 AM 11/01/2019    9:39 AM  BMP  Glucose 70 - 99 mg/dL 102  95  102   BUN 8 - 27 mg/dL '9  12  10   '$ Creatinine 0.76 - 1.27 mg/dL 0.82  0.82  0.75   BUN/Creat Ratio 10 - '24 11  15    '$ Sodium 134 - 144 mmol/L 136  138  138   Potassium 3.5 - 5.2 mmol/L 4.1  4.2  4.4   Chloride 96 - 106 mmol/L 100  102  105   CO2 20 - 29 mmol/L '26  25  25   '$ Calcium 8.6 - 10.2 mg/dL 9.4  8.9  8.9  Chest imaging: CXR 11/27/2020 1. Mild diffuse peribronchial cuffing which could be seen in the setting of acute or chronic bronchitis. 2. Old granulomatous disease, as above. 3. Aortic atherosclerosis.  PFT:    Latest Ref Rng & Units 02/05/2021    9:55 AM  PFT Results  FVC-Pre L 3.98   FVC-Predicted Pre % 81   FVC-Post L 4.24   FVC-Predicted Post % 87   Pre FEV1/FVC % % 64   Post FEV1/FCV % % 68   FEV1-Pre L 2.54   FEV1-Predicted Pre % 71   FEV1-Post L 2.89   DLCO uncorrected ml/min/mmHg 26.44   DLCO UNC% % 94   DLCO corrected ml/min/mmHg 26.44   DLCO COR %Predicted % 94   DLVA Predicted % 90   TLC L 8.33   TLC % Predicted % 108   RV % Predicted % 158      Labs:  Path:  Echo:  Heart Catheterization:  Assessment & Plan:   Chronic obstructive pulmonary disease, unspecified COPD type (HCC)  RSV infection - Plan: predniSONE (DELTASONE) 20 MG tablet  Discussion: Bryan Cowan is a 71 year old man, never smoker with history of prostate cancer and hyperlipidemia who returns to pulmonary clinic for bronchitis/COPD.  He had an acute flare of his COPD due to an RSV infection. He is to continue advair 115-24mg 2 puffs twice daily over the next month and then taper to the 45-250m advair inhaler twice daily. We will start him on prednisone '40mg'$  daily due to the rhonchi on exam.   Follow up in 6 months.  JoFreda JacksonMD [facility] Office: [phone]  Current Outpatient Medications:    fluticasone-salmeterol (ADVAIR HFA) 115-21 MCG/ACT inhaler, Inhale 2 puffs into the lungs 2 (two) times daily., Disp: 1 each, Rfl: 6   predniSONE (DELTASONE) 20 MG tablet, Take 2 tablets (40 mg total) by mouth daily with breakfast., Disp: 10 tablet, Rfl: 0   Ascorbic Acid (VITAMIN C) 1000 MG tablet, Take 1,000 mg by mouth daily., Disp: , Rfl:    aspirin EC 81 MG tablet, Take 81 mg by mouth daily. Swallow whole., Disp: , Rfl:    b complex vitamins tablet, Take 1 tablet by mouth daily., Disp: , Rfl:    brimonidine (ALPHAGAN) 0.2 % ophthalmic solution, Place 1 drop into the right eye 2 (two) times daily., Disp: , Rfl:    cholecalciferol (VITAMIN D3) 25 MCG (1000 UNIT) tablet, Take 1,000 Units by mouth daily., Disp: , Rfl:    Coenzyme Q10 100 MG capsule, Take 100 mg by mouth. , Disp: , Rfl:    latanoprost (XALATAN) 0.005 % ophthalmic solution, Place 1 drop into both eyes at bedtime., Disp: , Rfl:    magnesium 30 MG tablet, Take 30 mg by mouth daily., Disp: , Rfl:    MILK THISTLE PO, Take by mouth daily., Disp: , Rfl:    nitroGLYCERIN (NITROSTAT) 0.4 MG SL tablet, Place 1 tablet (0.4 mg total) under the tongue every 5 (five)  minutes as needed for chest pain., Disp: 25 tablet, Rfl: 6   Omega-3 Fatty Acids (FISH OIL) 1000 MG CAPS, Take by mouth daily., Disp: , Rfl:    timolol (TIMOPTIC) 0.5 % ophthalmic solution, timolol maleate 0.5 % eye drops  PLACE 1 DROP INTO BOTH EYES TWICE DAILY, Disp: , Rfl:

## 2021-09-15 ENCOUNTER — Encounter: Payer: Self-pay | Admitting: Pulmonary Disease

## 2021-09-16 ENCOUNTER — Encounter: Payer: Self-pay | Admitting: Interventional Cardiology

## 2021-09-17 ENCOUNTER — Telehealth: Payer: Self-pay | Admitting: Interventional Cardiology

## 2021-09-17 DIAGNOSIS — H35372 Puckering of macula, left eye: Secondary | ICD-10-CM | POA: Diagnosis not present

## 2021-09-17 DIAGNOSIS — H33001 Unspecified retinal detachment with retinal break, right eye: Secondary | ICD-10-CM | POA: Diagnosis not present

## 2021-09-17 DIAGNOSIS — H401113 Primary open-angle glaucoma, right eye, severe stage: Secondary | ICD-10-CM | POA: Diagnosis not present

## 2021-09-17 DIAGNOSIS — Z961 Presence of intraocular lens: Secondary | ICD-10-CM | POA: Diagnosis not present

## 2021-09-17 DIAGNOSIS — H40022 Open angle with borderline findings, high risk, left eye: Secondary | ICD-10-CM | POA: Diagnosis not present

## 2021-09-17 NOTE — Telephone Encounter (Signed)
See phone note

## 2021-09-17 NOTE — Telephone Encounter (Signed)
Spoke with patient and his wife by phone. Patient reports having some palpitations, and increased HR with 4 alerts for afib per his apple watch. Patient denies N/V and chest pain. He feels tired and light headed at times.   Reviewed with Dr. Tamala Julian (DOD) and he wants patient to come in for EKG and evaluation. Patient scheduled with DOD for 09/18/2021 at 10:30 AM.   Advised patient to stay hydrated, go to ED if he experiences worsening symptoms, severe SOB or CP. Patient and wife verbalized understanding.

## 2021-09-17 NOTE — Telephone Encounter (Signed)
Patient c/o Palpitations:  High priority if patient c/o lightheadedness, shortness of breath, or chest pain  How long have you had palpitations/irregular HR/ Afib? Are you having the symptoms now?   Couple of days  Are you currently experiencing lightheadedness, SOB or CP?  Tired with lightheadedness.  Do you have a history of afib (atrial fibrillation) or irregular heart rhythm?   No  Have you checked your BP or HR? (document readings if available):    Are you experiencing any other symptoms?   Patient is concerned about having increasing Afib as recorded on his Apple Watch.  Yesterday he had watch notices that he had Afib 4 times.  HR yesterday was 125.

## 2021-09-18 ENCOUNTER — Ambulatory Visit (INDEPENDENT_AMBULATORY_CARE_PROVIDER_SITE_OTHER): Payer: Medicare Other | Admitting: Internal Medicine

## 2021-09-18 ENCOUNTER — Encounter: Payer: Self-pay | Admitting: Internal Medicine

## 2021-09-18 ENCOUNTER — Ambulatory Visit (INDEPENDENT_AMBULATORY_CARE_PROVIDER_SITE_OTHER): Payer: Medicare Other

## 2021-09-18 VITALS — BP 120/68 | HR 72 | Ht 74.0 in | Wt 203.0 lb

## 2021-09-18 DIAGNOSIS — Z79899 Other long term (current) drug therapy: Secondary | ICD-10-CM | POA: Diagnosis not present

## 2021-09-18 DIAGNOSIS — I4891 Unspecified atrial fibrillation: Secondary | ICD-10-CM

## 2021-09-18 DIAGNOSIS — E782 Mixed hyperlipidemia: Secondary | ICD-10-CM

## 2021-09-18 DIAGNOSIS — I2510 Atherosclerotic heart disease of native coronary artery without angina pectoris: Secondary | ICD-10-CM | POA: Diagnosis not present

## 2021-09-18 LAB — TSH: TSH: 1.21 u[IU]/mL (ref 0.450–4.500)

## 2021-09-18 MED ORDER — RIVAROXABAN 20 MG PO TABS: 20.0000 mg | ORAL_TABLET | Freq: Every day | ORAL | 3 refills | Status: DC

## 2021-09-18 NOTE — Progress Notes (Signed)
Cardiology Office Note   Date:  09/18/2021   ID:  Bryan Cowan, DOB [birth year], MRN [id number]  PCP:  Haydee Salter, MD  Cardiologist:   Dorris Carnes, MD   Patient presents for evaluation of atrial fibrillation.      History of Present Illness: Bryan Cowan is a 71 y.o. male who is followed by Bryan Cowan   Just seen in June 2023.  Hx of dyspnea, HL, bradycardia and  CAD (CT angio in Nov 2022:   RCA   mild prox, mid; LAD  mild ; R1 mild   LCx  Nondominent   Severe mid LCx   FFR of OM1 signifciant      Yesterday the pt had an apple watch strip that showed atrial fibrillation.     Talking to the pt he may be a little more SOB   Notes occasionally may feel heart rate going faster      He denies CP   No dizziness  Current Meds  Medication Sig   Ascorbic Acid (VITAMIN C) 1000 MG tablet Take 1,000 mg by mouth daily.   aspirin EC 81 MG tablet Take 81 mg by mouth daily. Swallow whole.   b complex vitamins tablet Take 1 tablet by mouth daily.   brimonidine (ALPHAGAN) 0.2 % ophthalmic solution Place 1 drop into the right eye 2 (two) times daily.   cholecalciferol (VITAMIN D3) 25 MCG (1000 UNIT) tablet Take 1,000 Units by mouth daily.   Coenzyme Q10 100 MG capsule Take 100 mg by mouth.    fluticasone-salmeterol (ADVAIR HFA) 115-21 MCG/ACT inhaler Inhale 2 puffs into the lungs 2 (two) times daily.   latanoprost (XALATAN) 0.005 % ophthalmic solution Place 1 drop into both eyes at bedtime.   magnesium 30 MG tablet Take 30 mg by mouth daily.   MILK THISTLE PO Take by mouth daily.   nitroGLYCERIN (NITROSTAT) 0.4 MG SL tablet Place 1 tablet (0.4 mg total) under the tongue every 5 (five) minutes as needed for chest pain.   Omega-3 Fatty Acids (FISH OIL) 1000 MG CAPS Take by mouth daily.   timolol (TIMOPTIC) 0.5 % ophthalmic solution timolol maleate 0.5 % eye drops  PLACE 1 DROP INTO BOTH EYES TWICE DAILY     Allergies:   Patient has no known allergies.   Past Medical History:  Diagnosis Date    Basal cell carcinoma 05/19/2017   behind left ear cx3 [id number]   Coronary artery disease    History of retinal detachment    Hyperlipidemia    Prostate cancer ([facility]     Past Surgical History:  Procedure Laterality Date   BASAL CELL CARCINOMA EXCISION  2019   COLONOSCOPY  2013   INGUINAL HERNIA REPAIR Left 11/04/2019   Procedure: LEFT INGUINAL HERNIA REPAIR WITH MESH;  Surgeon: CErroll Luna MD;  [facility]  Service: General;  Laterality: Left;   INGUINAL HERNIA REPAIR Right 02/07/2021   Procedure: RIGHT INGUINAL HERNIA REPAIR WITH MESH;  Surgeon: CErroll Luna MD;  [facility]  Service: General;  Laterality: Right;   INSERTION OF MESH Left 11/04/2019   Procedure: INSERTION OF MESH;  Surgeon: CErroll Luna MD;  [facility]  Service: General;  Laterality: Left;   INSERTION OF MESH Right 02/07/2021   Procedure: INSERTION OF MESH;  Surgeon: CErroll Luna MD;  [facility]  Service: General;  Laterality: Right;   PROSTATECTOMY  2004   RETINAL DETACHMENT SURGERY Right  2015   RETINAL LASER PROCEDURE  2017   SIGMOIDOSCOPY  2000   TOTAL KNEE ARTHROPLASTY Bilateral 2009     Social History:  The patient  reports that he has never smoked. He has never used smokeless tobacco. He reports current alcohol use. He reports that he does not use drugs.   Family History:  The patient's family history includes COPD in his mother; Cancer in his daughter, father, and paternal uncle.    ROS:  Please see the history of present illness. All other systems are reviewed and  Negative to the above problem except as noted.    PHYSICAL EXAM: VS:  BP 120/68   Pulse 72   Ht '6\' 2"'$  (1.88 m)   Wt 203 lb (92.1 kg)   SpO2 93%   BMI 26.06 kg/m   GEN: Well nourished, well developed, in no acute distress  HEENT: normal  Neck: no JVD, no carotid bruits Cardiac: RRR; no murmurs  No LE edema  Respiratory:   clear to auscultation bilaterally,  GI: soft, nontender, nondistended, + BS  No hepatomegaly  MS: no deformity Moving all extremities   Skin: warm and dry, no rash Neuro:  Strength and sensation are intact Psych: euthymic mood, full affect   EKG:  EKG is ordered today.  SR 72 bpm   First degree AV block  PR interval 234 msec      Lipid Panel    Component Value Date/Time   CHOL 233 (H) 08/07/2021 0816   TRIG 73 08/07/2021 0816   HDL 50 08/07/2021 0816   CHOLHDL 4.7 08/07/2021 0816   CHOLHDL 4 11/27/2020 1017   VLDL 20.0 11/27/2020 1017   LDLCALC 170 (H) 08/07/2021 0816      Wt Readings from Last 3 Encounters:  09/18/21 203 lb (92.1 kg)  09/10/21 203 lb (92.1 kg)  08/01/21 212 lb (96.2 kg)      ASSESSMENT AND PLAN:  1  PAF   Pt's strips for yesterday show afib with RVR   Not sure how long he was in this    ? In /out multiple times     Will set up for a 2 week Zio patch I reviewed risks of CVA with patient   CHADSVASc score is 2  (age, CAD) Would recomm Xarelto   daily Check TSH    Follow up with Bryan Cowan  2  CAD   No symtposm of angina  3  HL   Reviewed lipids with pt   he does not want a statin   Will review with Dr Irish Lack at future visits  4  Hx dyspnea   Follow symptoms with monitor   ? If correlate with  PAF     Current medicines are reviewed at length with the patient today.  The patient does not have concerns regarding medicines.  Signed, Dorris Carnes, MD  09/18/2021 11:02 AM    [facility] [place], [place], [place]  [address] Phone: [phone]; Fax: [phone]

## 2021-09-18 NOTE — Progress Notes (Unsigned)
Enrolled for [employer] to mail a ZIO XT long term holter monitor to the patients address on file.   Dr. Ross to read. 

## 2021-09-18 NOTE — Patient Instructions (Signed)
Medication Instructions:  Start xarelto 20 mg daily  *If you need a refill on your cardiac medications before your next appointment, please call your pharmacy*   Lab Work: TSH If you have labs (blood work) drawn today and your tests are completely normal, you will receive your results only by: [place] (if you have MyChart) OR A paper copy in the mail If you have any lab test that is abnormal or we need to change your treatment, we will call you to review the results.   Testing/Procedures: Bryn Gulling- Long Term Monitor Instructions  Your physician has requested you wear a ZIO patch monitor for 14 days.  This is a single patch monitor. [employer] supplies one patch monitor per enrollment. Additional stickers are not available. Please do not apply patch if you will be having a Nuclear Stress Test,  Echocardiogram, Cardiac CT, MRI, or Chest Xray during the period you would be wearing the  monitor. The patch cannot be worn during these tests. You cannot remove and re-apply the  ZIO XT patch monitor.  Your ZIO patch monitor will be mailed 3 day USPS to your address on file. It may take 3-5 days  to receive your monitor after you have been enrolled.  Once you have received your monitor, please review the enclosed instructions. Your monitor  has already been registered assigning a specific monitor serial # to you.  Billing and Patient Assistance Program Information  We have supplied [employer] with any of your insurance information on file for billing purposes. [employer] offers a sliding scale Patient Assistance Program for patients that do not have  insurance, or whose insurance does not completely cover the cost of the ZIO monitor.  You must apply for the Patient Assistance Program to qualify for this discounted rate.  To apply, please call [employer] at [phone], select option 4, select option 2, ask to apply for  Patient Assistance Program. Theodore Demark will ask your household income, and how  many people  are in your household. They will quote your out-of-pocket cost based on that information.  [employer] will also be able to set up a 38-month interest-free payment plan if needed.  Applying the monitor   Shave hair from upper left chest.  Hold abrader disc by orange tab. Rub abrader in 40 strokes over the upper left chest as  indicated in your monitor instructions.  Clean area with 4 enclosed alcohol pads. Let dry.  Apply patch as indicated in monitor instructions. Patch will be placed under collarbone on left  side of chest with arrow pointing upward.  Rub patch adhesive wings for 2 minutes. Remove white label marked "1". Remove the white  label marked "2". Rub patch adhesive wings for 2 additional minutes.  While looking in a mirror, press and release button in center of patch. A small green light will  flash 3-4 times. This will be your only indicator that the monitor has been turned on.  Do not shower for the first 24 hours. You may shower after the first 24 hours.  Press the button if you feel a symptom. You will hear a small click. Record Date, Time and  Symptom in the Patient Logbook.  When you are ready to remove the patch, follow instructions on the last 2 pages of Patient  Logbook. Stick patch monitor onto the last page of Patient Logbook.  Place Patient Logbook in the blue and white box. Use locking tab on box and tape box closed  securely. The blue  and white box has prepaid postage on it. Please place it in the mailbox as  soon as possible. Your physician should have your test results approximately 7 days after the  monitor has been mailed back to [facility].  Call [facility] at [phone] if you have questions regarding  your ZIO XT patch monitor. Call them immediately if you see an orange light blinking on your  monitor.  If your monitor falls off in less than 4 days, contact our Monitor department at [phone].  If your monitor becomes  loose or falls off after 4 days call [employer] at [phone] for  suggestions on securing your monitor    Follow-Up: At [facility], you and your health needs are our priority.  As part of our continuing mission to provide you with exceptional heart care, we have created designated Provider Care Teams.  These Care Teams include your primary Cardiologist (physician) and Advanced Practice Providers (APPs -  Physician Assistants and Nurse Practitioners) who all work together to provide you with the care you need, when you need it.  We recommend signing up for the patient portal called "MyChart".  Sign up information is provided on this After Visit Summary.  MyChart is used to connect with patients for Virtual Visits (Telemedicine).  Patients are able to view lab/test results, encounter notes, upcoming appointments, etc.  Non-urgent messages can be sent to your provider as well.   To learn more about what you can do with MyChart, go to [email or web address].    Your next appointment:   6 week(s)  The format for your next appointment:   In Person  Provider:   Larae Grooms, MD     Other Instructions   Important Information About Sugar

## 2021-09-21 DIAGNOSIS — I4891 Unspecified atrial fibrillation: Secondary | ICD-10-CM | POA: Diagnosis not present

## 2021-09-21 DIAGNOSIS — Z79899 Other long term (current) drug therapy: Secondary | ICD-10-CM | POA: Diagnosis not present

## 2021-10-12 DIAGNOSIS — I4891 Unspecified atrial fibrillation: Secondary | ICD-10-CM | POA: Diagnosis not present

## 2021-10-12 DIAGNOSIS — I2510 Atherosclerotic heart disease of native coronary artery without angina pectoris: Secondary | ICD-10-CM | POA: Diagnosis not present

## 2021-10-14 ENCOUNTER — Encounter: Payer: Self-pay | Admitting: Interventional Cardiology

## 2021-10-16 NOTE — Telephone Encounter (Signed)
Bryan Booze, MD  You 16 minutes ago (4:01 PM)    The current standard practice is to remain on the blood thinner even when you are not having episodes.  There is some research going into taking the blood thinner only when people have symptoms of atrial fibrillation.  There is no definitive evidence that this is safe or effective in preventing stroke.  I would recommend staying on the medicine for now as long as you are not having a bleeding problem.  We will try to look into assistance programs due to the cost.  Unfortunately, there is no generic equivalent for this medicine.   The main finding of the monitor is that there were no additional atrial fibrillation episodes detected.  I believe your smart watch is what initially picked up the atrial fibrillation.  I would also continue to use that feature as atrial fibrillation can occur without symptoms.  This is why symptoms may not be the most accurate way of determining whether or not there are episodes of atrial fibrillation.   JV

## 2021-10-18 ENCOUNTER — Telehealth: Payer: Self-pay | Admitting: Internal Medicine

## 2021-10-18 ENCOUNTER — Other Ambulatory Visit: Payer: Self-pay | Admitting: *Deleted

## 2021-10-18 DIAGNOSIS — I4891 Unspecified atrial fibrillation: Secondary | ICD-10-CM

## 2021-10-18 MED ORDER — RIVAROXABAN 20 MG PO TABS: 20.0000 mg | ORAL_TABLET | Freq: Every day | ORAL | 1 refills | Status: DC

## 2021-10-18 NOTE — Telephone Encounter (Signed)
Pt c/o medication issue:  1. Name of Medication:   rivaroxaban (XARELTO) 20 MG TABS tablet  2. How are you currently taking this medication (dosage and times per day)? As prescribed  3. Are you having a reaction (difficulty breathing--STAT)? No  4. What is your medication issue?   Patient called stating he has applied to get this medication through [facility] and they will be sending paperwork for provider to verify prescription.

## 2021-10-18 NOTE — Telephone Encounter (Signed)
Xarelto '20mg'$  refill request received for [facility]. Pt is 71 years old, weight-92.1kg, Crea-0.82 on 08/07/2021, last seen by Dr. Harrington Challenger on 09/18/2021, Diagnosis-Afib, CrCl-106.33m/min; Dose is appropriate based on dosing criteria. Will send in refill to requested pharmacy.

## 2021-10-19 DIAGNOSIS — H31092 Other chorioretinal scars, left eye: Secondary | ICD-10-CM | POA: Diagnosis not present

## 2021-10-19 DIAGNOSIS — H59811 Chorioretinal scars after surgery for detachment, right eye: Secondary | ICD-10-CM | POA: Diagnosis not present

## 2021-10-19 MED ORDER — RIVAROXABAN 20 MG PO TABS: 20.0000 mg | ORAL_TABLET | Freq: Every day | ORAL | 3 refills | Status: DC

## 2021-10-19 NOTE — Telephone Encounter (Signed)
**Note De-Identified Bryan Cowan Obfuscation** I have e-scribed the pts Xarelto 20 mg RX to [facility] (pharmacy for [facility]) for #90 with 3 refills.  I will address paperwork from [facility] when/if received.

## 2021-11-02 ENCOUNTER — Ambulatory Visit: Payer: Medicare Other | Attending: Interventional Cardiology | Admitting: Interventional Cardiology

## 2021-11-02 ENCOUNTER — Encounter: Payer: Self-pay | Admitting: Interventional Cardiology

## 2021-11-02 VITALS — BP 122/82 | HR 76 | Ht 74.0 in | Wt 199.0 lb

## 2021-11-02 DIAGNOSIS — I77819 Aortic ectasia, unspecified site: Secondary | ICD-10-CM | POA: Insufficient documentation

## 2021-11-02 DIAGNOSIS — R7301 Impaired fasting glucose: Secondary | ICD-10-CM | POA: Diagnosis not present

## 2021-11-02 DIAGNOSIS — E782 Mixed hyperlipidemia: Secondary | ICD-10-CM | POA: Diagnosis not present

## 2021-11-02 DIAGNOSIS — I25119 Atherosclerotic heart disease of native coronary artery with unspecified angina pectoris: Secondary | ICD-10-CM | POA: Diagnosis not present

## 2021-11-02 DIAGNOSIS — I700 Atherosclerosis of aorta: Secondary | ICD-10-CM

## 2021-11-02 DIAGNOSIS — I480 Paroxysmal atrial fibrillation: Secondary | ICD-10-CM | POA: Insufficient documentation

## 2021-11-02 NOTE — Progress Notes (Signed)
Cardiology Office Note   Date:  11/02/2021   ID:  Bryan Cowan, DOB 11-11-1950, MRN [id number]  PCP:  Haydee Salter, MD    No chief complaint on file.  CAD/AFib  Wt Readings from Last 3 Encounters:  11/02/21 199 lb (90.3 kg)  09/18/21 203 lb (92.1 kg)  09/10/21 203 lb (92.1 kg)       History of Present Illness: Bryan Cowan is a 71 y.o. male   who I saw for dyspnea on exertion in November 2022.  He had a coronary CTA showing: "Aortic Valve:  Trileaflet.  Mild calcifications.   Coronary Arteries:  Normal coronary origin.  Right dominance.   RCA is a large dominant artery that gives rise to PDA and PLVB. There is mild (25-49%) calcified plaque in the proximal and mid RCA with minimal (<25%) calcified plaque distally.   Left main is a large artery that gives rise to LAD, RI, and LCX arteries.   LAD is a large vessel that has scattered mild (25-49%) calcified plaque. D1 is a tiny vessel with minimal (<25%) calcified plaque.   RI is a small (1.6 mm) vessel with mild (25-49%) calcified plaque.   LCX is a non-dominant artery that gives rise to a tiny OM1 vessel with mild (25-49%) calcified plaque. There is severe (>70%) mixed plaque in the mid LCX."   CT FFR in the OM1 was significant.   In 01/2021: "He continues to exercise regularly.  He denies any chest discomfort.  He has chronic cough for which he is seeing pulmonary.  He has not had to stop exercising or stop walking at any point because of discomfort." He was planning hernia surgery in January 2023.  Everything went well.   Apple watch showed AFib.  COnfirmed by [facility] patch.   Tolerating Xarelto.  Denies : Chest pain. Dizziness. Leg edema. Nitroglycerin use. Orthopnea. Palpitations. Paroxysmal nocturnal dyspnea. Shortness of breath. Syncope.      Past Medical History:  Diagnosis Date   Basal cell carcinoma 05/19/2017   behind left ear cx3 37f   Coronary artery disease    History of retinal detachment     Hyperlipidemia    Prostate cancer ([facility] [address] [address]     Past Surgical History:  Procedure Laterality Date   BASAL CELL CARCINOMA EXCISION  2019   COLONOSCOPY  2013   INGUINAL HERNIA REPAIR Left 11/04/2019   Procedure: LEFT INGUINAL HERNIA REPAIR WITH MESH;  Surgeon: CErroll Luna MD;  [place] [facility] [place]  Service: General;  Laterality: Left;   INGUINAL HERNIA REPAIR Right 02/07/2021   Procedure: RIGHT INGUINAL HERNIA REPAIR WITH MESH;  Surgeon: CErroll Luna MD;  [facility]  Service: General;  Laterality: Right;   INSERTION OF MESH Left 11/04/2019   Procedure: INSERTION OF MESH;  Surgeon: CErroll Luna MD;  [place] [facility]  Service: General;  Laterality: Left;   INSERTION OF MESH Right 02/07/2021   Procedure: INSERTION OF MESH;  Surgeon: CErroll Luna MD;  [place] [facility]  Service: General;  Laterality: Right;   PROSTATECTOMY  2004   RETINAL DETACHMENT SURGERY Right 2015   RETINAL LASER PROCEDURE  2017   SIGMOIDOSCOPY  2000   TOTAL KNEE ARTHROPLASTY Bilateral 2009     Current Outpatient Medications  Medication Sig Dispense Refill   Ascorbic Acid (VITAMIN C) 1000 MG tablet Take 1,000 mg by mouth daily.     aspirin EC 81 MG tablet Take 81 mg by  mouth daily. Swallow whole.     b complex vitamins tablet Take 1 tablet by mouth daily.     brimonidine (ALPHAGAN) 0.2 % ophthalmic solution Place 1 drop into the right eye 2 (two) times daily.     cholecalciferol (VITAMIN D3) 25 MCG (1000 UNIT) tablet Take 1,000 Units by mouth daily.     Coenzyme Q10 100 MG capsule Take 100 mg by mouth.      fluticasone-salmeterol (ADVAIR HFA) 115-21 MCG/ACT inhaler Inhale 2 puffs into the lungs 2 (two) times daily. 1 each 6   latanoprost (XALATAN) 0.005 % ophthalmic solution Place 1 drop into both eyes at bedtime.     magnesium 30 MG tablet Take 30 mg by mouth daily.     MILK THISTLE PO Take by mouth daily.      nitroGLYCERIN (NITROSTAT) 0.4 MG SL tablet Place 1 tablet (0.4 mg total) under the tongue every 5 (five) minutes as needed for chest pain. 25 tablet 6   Omega-3 Fatty Acids (FISH OIL) 1000 MG CAPS Take by mouth daily.     rivaroxaban (XARELTO) 20 MG TABS tablet Take 1 tablet (20 mg total) by mouth daily with supper. 90 tablet 3   timolol (TIMOPTIC) 0.5 % ophthalmic solution timolol maleate 0.5 % eye drops  PLACE 1 DROP INTO BOTH EYES TWICE DAILY     No current facility-administered medications for this visit.    Allergies:   Patient has no known allergies.    Social History:  The patient  reports that he has never smoked. He has never used smokeless tobacco. He reports current alcohol use. He reports that he does not use drugs.   Family History:  The patient's family history includes COPD in his mother; Cancer in his daughter, father, and paternal uncle.    ROS:  Please see the history of present illness.   Otherwise, review of systems are positive for nuisance bleeding when he plays with his dog.   All other systems are reviewed and negative.    PHYSICAL EXAM: VS:  BP 122/82   Pulse 76   Ht '6\' 2"'$  (1.88 m)   Wt 199 lb (90.3 kg)   SpO2 96%   BMI 25.55 kg/m  , BMI Body mass index is 25.55 kg/m. GEN: Well nourished, well developed, in no acute distress HEENT: normal Neck: no JVD, carotid bruits, or masses Cardiac: RRR; no murmurs, rubs, or gallops,no edema  Respiratory:  clear to auscultation bilaterally, normal work of breathing GI: soft, nontender, nondistended, + BS MS: no deformity or atrophy Skin: warm and dry, no rash Neuro:  Strength and sensation are intact Psych: euthymic mood, full affect    Recent Labs: 08/07/2021: ALT 16; BUN 9; Creatinine, Ser 0.82; Hemoglobin 14.0; Platelets 230; Potassium 4.1; Sodium 136 09/18/2021: TSH 1.210   Lipid Panel    Component Value Date/Time   CHOL 233 (H) 08/07/2021 0816   TRIG 73 08/07/2021 0816   HDL 50 08/07/2021 0816    CHOLHDL 4.7 08/07/2021 0816   CHOLHDL 4 11/27/2020 1017   VLDL 20.0 11/27/2020 1017   LDLCALC 170 (H) 08/07/2021 0816     Other studies Reviewed: Additional studies/ records that were reviewed today with results demonstrating: labs reviewed.     ASSESSMENT AND PLAN:  CAD: No angina on medical therapy.  Not using NTG.  Remains active.  HE has lost weight.  Goes to the gym frequently.  Hyperlipidemia: LDL 170.  Declined statin.  Hoping to control things with diet.  Thoracic aortic aneurysm: 41 mm in 6/23.  COntrolled BP at home.  AFib: Maintaining NSR. Labs to be checked in 2/24. PVCs by prior ECG/bradycardia noted by Apple watch.  No sx.   Current medicines are reviewed at length with the patient today.  The patient concerns regarding his medicines were addressed.  The following changes have been made:  No change  Labs/ tests ordered today include:  No orders of the defined types were placed in this encounter.   Recommend 150 minutes/week of aerobic exercise Low fat, low carb, high fiber diet recommended  Disposition:   FU in 6/24.   Signed, Larae Grooms, MD  11/02/2021 3:51 PM    [facility] [address], [place], [place]  [address] Phone: [phone]; Fax: [phone]

## 2021-11-02 NOTE — Patient Instructions (Signed)
Medication Instructions:  Your physician recommends that you continue on your current medications as directed. Please refer to the Current Medication list given to you today.  *If you need a refill on your cardiac medications before your next appointment, please call your pharmacy*   Lab Work: Your physician recommends that you return for lab work on Apr 01, 2022.  CBC, CMET, Lipids.  This will be fasting  The lab opens at 7:15 AM  If you have labs (blood work) drawn today and your tests are completely normal, you will receive your results only by: [place] (if you have MyChart) OR A paper copy in the mail If you have any lab test that is abnormal or we need to change your treatment, we will call you to review the results.   Testing/Procedures: none   Follow-Up: At [facility], you and your health needs are our priority.  As part of our continuing mission to provide you with exceptional heart care, we have created designated Provider Care Teams.  These Care Teams include your primary Cardiologist (physician) and Advanced Practice Providers (APPs -  Physician Assistants and Nurse Practitioners) who all work together to provide you with the care you need, when you need it.  We recommend signing up for the patient portal called "MyChart".  Sign up information is provided on this After Visit Summary.  MyChart is used to connect with patients for Virtual Visits (Telemedicine).  Patients are able to view lab/test results, encounter notes, upcoming appointments, etc.  Non-urgent messages can be sent to your provider as well.   To learn more about what you can do with MyChart, go to [email or web address].    Your next appointment:   June 2024  The format for your next appointment:   In Person  Provider:   Larae Grooms, MD     Other Instructions    Important Information About Sugar

## 2021-12-04 ENCOUNTER — Encounter (INDEPENDENT_AMBULATORY_CARE_PROVIDER_SITE_OTHER): Payer: Medicare Other | Admitting: Ophthalmology

## 2021-12-04 DIAGNOSIS — X32XXXA Exposure to sunlight, initial encounter: Secondary | ICD-10-CM | POA: Diagnosis not present

## 2021-12-04 DIAGNOSIS — Z1283 Encounter for screening for malignant neoplasm of skin: Secondary | ICD-10-CM | POA: Diagnosis not present

## 2021-12-04 DIAGNOSIS — D225 Melanocytic nevi of trunk: Secondary | ICD-10-CM | POA: Diagnosis not present

## 2021-12-04 DIAGNOSIS — L570 Actinic keratosis: Secondary | ICD-10-CM | POA: Diagnosis not present

## 2021-12-05 ENCOUNTER — Ambulatory Visit: Payer: Medicare Other | Admitting: Dermatology

## 2021-12-14 ENCOUNTER — Encounter: Payer: Self-pay | Admitting: Internal Medicine

## 2022-01-21 DIAGNOSIS — H40022 Open angle with borderline findings, high risk, left eye: Secondary | ICD-10-CM | POA: Diagnosis not present

## 2022-01-21 DIAGNOSIS — H33001 Unspecified retinal detachment with retinal break, right eye: Secondary | ICD-10-CM | POA: Diagnosis not present

## 2022-01-21 DIAGNOSIS — H35372 Puckering of macula, left eye: Secondary | ICD-10-CM | POA: Diagnosis not present

## 2022-01-21 DIAGNOSIS — H401113 Primary open-angle glaucoma, right eye, severe stage: Secondary | ICD-10-CM | POA: Diagnosis not present

## 2022-01-21 DIAGNOSIS — Z961 Presence of intraocular lens: Secondary | ICD-10-CM | POA: Diagnosis not present

## 2022-02-04 DIAGNOSIS — M9905 Segmental and somatic dysfunction of pelvic region: Secondary | ICD-10-CM | POA: Diagnosis not present

## 2022-02-04 DIAGNOSIS — M9903 Segmental and somatic dysfunction of lumbar region: Secondary | ICD-10-CM | POA: Diagnosis not present

## 2022-02-04 DIAGNOSIS — M5136 Other intervertebral disc degeneration, lumbar region: Secondary | ICD-10-CM | POA: Diagnosis not present

## 2022-02-04 DIAGNOSIS — M9904 Segmental and somatic dysfunction of sacral region: Secondary | ICD-10-CM | POA: Diagnosis not present

## 2022-02-06 DIAGNOSIS — M5136 Other intervertebral disc degeneration, lumbar region: Secondary | ICD-10-CM | POA: Diagnosis not present

## 2022-02-06 DIAGNOSIS — M9904 Segmental and somatic dysfunction of sacral region: Secondary | ICD-10-CM | POA: Diagnosis not present

## 2022-02-06 DIAGNOSIS — M9905 Segmental and somatic dysfunction of pelvic region: Secondary | ICD-10-CM | POA: Diagnosis not present

## 2022-02-06 DIAGNOSIS — M9903 Segmental and somatic dysfunction of lumbar region: Secondary | ICD-10-CM | POA: Diagnosis not present

## 2022-03-03 NOTE — Progress Notes (Unsigned)
03/04/2022 Dalessandro Baldyga [id number] Feb 25, 1950   CHIEF COMPLAINT: Schedule a colonoscopy   HISTORY OF PRESENT ILLNESS: Ezra Denne is a 72 year old male with a past medial history of coronary artery disease, hyperlipidemia, atrial fibrillation on Xarelto, thoracic aortic aneurysm measuring 29m, COPD, prostate cancer and colon polyps. He presents today to scheduled a colonoscopy. He is on Xarelto for atrial fibrillation. He denies having any abdominal pain.  He is passing a normal formed brown bowel movement most days.  No rectal bleeding or black stools.  No chest pain, palpitations or shortness of breath.  His most recent colonoscopy was 02/08/2019 which identified seven 2 to 5 mm tubular adenomatous/sessile serrated polyps removed from the colon and rectum.  Repeat colonoscopy in 3 years was recommended.      Latest Ref Rng & Units 08/07/2021    8:16 AM 11/01/2019    9:39 AM 12/02/2018   10:04 AM  CBC  WBC 3.4 - 10.8 x10E3/uL 4.9  6.0  5.3   Hemoglobin 13.0 - 17.7 g/dL 14.0  13.8  14.1   Hematocrit 37.5 - 51.0 % 42.9  43.5  42.7   Platelets 150 - 450 x10E3/uL 230  216  214.0         Latest Ref Rng & Units 08/07/2021    8:16 AM 01/17/2021    7:11 AM 11/01/2019    9:39 AM  CMP  Glucose 70 - 99 mg/dL 102  95  102   BUN 8 - 27 mg/dL '9  12  10   '$ Creatinine 0.76 - 1.27 mg/dL 0.82  0.82  0.75   Sodium 134 - 144 mmol/L 136  138  138   Potassium 3.5 - 5.2 mmol/L 4.1  4.2  4.4   Chloride 96 - 106 mmol/L 100  102  105   CO2 20 - 29 mmol/L '26  25  25   '$ Calcium 8.6 - 10.2 mg/dL 9.4  8.9  8.9   Total Protein 6.0 - 8.5 g/dL 6.4   6.4   Total Bilirubin 0.0 - 1.2 mg/dL 0.7   0.6   Alkaline Phos 44 - 121 IU/L 57   51   AST 0 - 40 IU/L 26   29   ALT 0 - 44 IU/L 16   19      ECHO 01/17/2021: LVEF 50 to 55%.  GI PROCEDURES:  Colonoscopy 02/08/2019 by Dr. PHenrene Pastor  - 3 year colonoscopy recall Path report: 1. Surgical [P], colon, cecum, polyp (3) - TUBULAR ADENOMA. - SESSILE SERRATED  POLYP(S) WITHOUT CYTOLOGIC DYSPLASIA. - NO HIGH GRADE DYSPLASIA OR MALIGNANCY. 2. Surgical [P], colon, ascending, transverse, polyp (3) - TUBULAR ADENOMA. - SESSILE SERRATED POLYP(S) WITHOUT CYTOLOGIC DYSPLASIA. - NO HIGH GRADE DYSPLASIA OR MALIGNANCY. 3. Surgical [P], colon, rectum, polyp - TUBULAR ADENOMA. - NO HIGH GRADE DYSPLASIA OR MALIGNANCY.   Past Medical History:  Diagnosis Date   Basal cell carcinoma 05/19/2017   behind left ear cx3 538f  Coronary artery disease    History of retinal detachment    Hyperlipidemia    [facility]   Past Surgical History:  Procedure Laterality Date   BASAL CELL CARCINOMA EXCISION  2019   COLONOSCOPY  2013   INGUINAL HERNIA REPAIR Left 11/04/2019   Procedure: LEFT INGUINAL HERNIA REPAIR WITH MESH;  Surgeon: CoErroll LunaMD;  [place] [facility] Service: General;  Laterality: Left;   INGUINAL HERNIA REPAIR Right 02/07/2021   Procedure: RIGHT  INGUINAL HERNIA REPAIR WITH MESH;  Surgeon: Erroll Luna, MD;  Location: [place];  Service: General;  Laterality: Right;   INSERTION OF MESH Left 11/04/2019   Procedure: INSERTION OF MESH;  Surgeon: Erroll Luna, MD;  Location: [place];  Service: General;  Laterality: Left;   INSERTION OF MESH Right 02/07/2021   Procedure: INSERTION OF MESH;  Surgeon: Erroll Luna, MD;  Location: [place];  Service: General;  Laterality: Right;   PROSTATECTOMY  2004   RETINAL DETACHMENT SURGERY Right 2015   RETINAL LASER PROCEDURE  2017   SIGMOIDOSCOPY  2000   TOTAL KNEE ARTHROPLASTY Bilateral 2009   Social History: Nonsmoker. He drinks 1 or 2 beers daily. No drug use.   Family History: Father had lung cancer (smoker/asbestos exposure). Mother had skin cancer. Paternal uncle had liver caner. Daughter had melanoma.   No Known Allergies    Outpatient Encounter Medications as of 03/04/2022  Medication Sig   Na Sulfate-K  Sulfate-Mg Sulf 17.5-3.13-1.6 GM/177ML SOLN Take 1 kit by mouth once for 1 dose.   Ascorbic Acid (VITAMIN C) 1000 MG tablet Take 1,000 mg by mouth daily.   aspirin EC 81 MG tablet Take 81 mg by mouth daily. Swallow whole.   b complex vitamins tablet Take 1 tablet by mouth daily.   brimonidine (ALPHAGAN) 0.2 % ophthalmic solution Place 1 drop into the right eye 2 (two) times daily.   cholecalciferol (VITAMIN D3) 25 MCG (1000 UNIT) tablet Take 1,000 Units by mouth daily.   Coenzyme Q10 100 MG capsule Take 100 mg by mouth.    fluticasone-salmeterol (ADVAIR HFA) 115-21 MCG/ACT inhaler Inhale 2 puffs into the lungs 2 (two) times daily.   latanoprost (XALATAN) 0.005 % ophthalmic solution Place 1 drop into both eyes at bedtime.   magnesium 30 MG tablet Take 30 mg by mouth daily.   MILK THISTLE PO Take by mouth daily.   nitroGLYCERIN (NITROSTAT) 0.4 MG SL tablet Place 1 tablet (0.4 mg total) under the tongue every 5 (five) minutes as needed for chest pain.   Omega-3 Fatty Acids (FISH OIL) 1000 MG CAPS Take by mouth daily.   rivaroxaban (XARELTO) 20 MG TABS tablet Take 1 tablet (20 mg total) by mouth daily with supper.   timolol (TIMOPTIC) 0.5 % ophthalmic solution timolol maleate 0.5 % eye drops  PLACE 1 DROP INTO BOTH EYES TWICE DAILY   No facility-administered encounter medications on file as of 03/04/2022.   REVIEW OF SYSTEMS:  Gen: Denies fever, sweats or chills. No weight loss.  CV: Denies chest pain, palpitations or edema. Resp: Denies cough, shortness of breath of hemoptysis.  GI: See HPI.  No GERD symptoms.   GU : Denies urinary burning, blood in urine, increased urinary frequency or incontinence. MS: Denies joint pain, muscles aches or weakness. Derm: Denies rash, itchiness, skin lesions or unhealing ulcers. Psych: Denies depression, anxiety, memory loss or confusion. Heme: Denies bruising, easy bleeding. Neuro:  Denies headaches, dizziness or paresthesias. Endo:  Denies any problems  with DM, thyroid or adrenal function.  PHYSICAL EXAM: BP 122/62   Pulse 88   Ht '6\' 1"'$  (1.854 m)   Wt 211 lb (95.7 kg)   SpO2 98%   BMI 27.84 kg/m  General: 72 year old male in no acute distress. Head: Normocephalic and atraumatic. Eyes:  Sclerae non-icteric, conjunctive pink. Ears: Normal auditory acuity. Mouth: Dentition intact. No ulcers or lesions.  Neck: Supple, no lymphadenopathy or thyromegaly.  Lungs: Clear bilaterally to  auscultation without wheezes, crackles or rhonchi. Heart: Regular rate and rhythm. No murmur, rub or gallop appreciated.  Abdomen: Soft, nontender, nondistended. No masses. No hepatosplenomegaly. Normoactive bowel sounds x 4 quadrants.  Rectal: Deferred. Musculoskeletal: Symmetrical with no gross deformities. Skin: Warm and dry. No rash or lesions on visible extremities. Extremities: No edema. Neurological: Alert oriented x 4, no focal deficits.  Psychological:  Alert and cooperative. Normal mood and affect.  ASSESSMENT AND PLAN:  73) 72 year old male with a history of tubular adenomatous and sessile serrated colon polyps per colonoscopy 01/2019. -Colonoscopy benefits and risks discussed including risk with sedation, risk of bleeding, perforation and infection  -Our office will contact cardiologist Dr. Armando Gang to verify Leon hold instructions prior to proceeding with a colonoscopy -Further recommendations to be determined after colonoscopy completed  2) Atrial fibrillation on Xarelto. CHADSVASc score is 2.  -See plan in # 1  3) History of coronary artery disease.  No angina.  4) Thoracic aortic aneurysm  5) History of prostate cancer     CC:  Haydee Salter, MD

## 2022-03-04 ENCOUNTER — Encounter: Payer: Self-pay | Admitting: Nurse Practitioner

## 2022-03-04 ENCOUNTER — Ambulatory Visit (INDEPENDENT_AMBULATORY_CARE_PROVIDER_SITE_OTHER): Payer: Medicare Other | Admitting: Nurse Practitioner

## 2022-03-04 ENCOUNTER — Telehealth: Payer: Self-pay

## 2022-03-04 VITALS — BP 122/62 | HR 88 | Ht 73.0 in | Wt 211.0 lb

## 2022-03-04 DIAGNOSIS — I4891 Unspecified atrial fibrillation: Secondary | ICD-10-CM | POA: Diagnosis not present

## 2022-03-04 DIAGNOSIS — Z86010 Personal history of colonic polyps: Secondary | ICD-10-CM | POA: Diagnosis not present

## 2022-03-04 MED ORDER — NA SULFATE-K SULFATE-MG SULF 17.5-3.13-1.6 GM/177ML PO SOLN
1.0000 | Freq: Once | ORAL | 0 refills | Status: AC
Start: 2022-03-04 — End: 2022-03-04

## 2022-03-04 NOTE — Telephone Encounter (Signed)
[facility] Pre-operative Risk Assessment     Request for surgical clearance:     Endoscopy Procedure  What type of surgery is being performed?     Colonoscopy  When is this surgery scheduled?     04/02/22  What type of clearance is required ?   Pharmacy  Are there any medications that need to be held prior to surgery and how long? Xarelto , 2 daus  Practice name and name of physician performing surgery?      [facility]  What is your office phone and fax number?      Phone- [phone]  [phone]  Anesthesia type (None, local, MAC, general) ?       MAC

## 2022-03-04 NOTE — Patient Instructions (Addendum)
  If you are age 74 or older, your body mass index should be between 23-30. Your Body mass index is 27.84 kg/m. If this is out of the aforementioned range listed, please consider follow up with your Primary Care Provider.  If you are age 83 or younger, your body mass index should be between 19-25. Your Body mass index is 27.84 kg/m. If this is out of the aformentioned range listed, please consider follow up with your Primary Care Provider.   You have been scheduled for a colonoscopy. Please follow written instructions given to you at your visit today.  Please pick up your prep supplies at the pharmacy within the next 1-3 days. If you use inhalers (even only as needed), please bring them with you on the day of your procedure.   The [facility] providers would like to encourage you to use [facility] to communicate with providers for non-urgent requests or questions.  Due to long hold times on the telephone, sending your provider a message by [facility] may be a faster and more efficient way to get a response.  Please allow 48 business hours for a response.  Please remember that this is for non-urgent requests.   It was a pleasure to see you today!  Thank you for trusting me with your gastrointestinal care!

## 2022-03-04 NOTE — Telephone Encounter (Signed)
Should be ok to hold Xarelto 2 days prior but will verify with PharmD per office protocol.

## 2022-03-04 NOTE — Progress Notes (Signed)
Assessment and plan noted ?

## 2022-03-04 NOTE — Telephone Encounter (Signed)
Patient with diagnosis of afib on Xarelto for anticoagulation.    Procedure: colonoscopy Date of procedure: 04/02/22  CHA2DS2-VASc Score = 2  This indicates a 2.2% annual risk of stroke. The patient's score is based upon: CHF History: 0 HTN History: 0 Diabetes History: 0 Stroke History: 0 Vascular Disease History: 1 Age Score: 1 Gender Score: 0   CrCl 63m/min Platelet count 230K  Agree that per office protocol, patient can hold Xarelto for 2 days prior to procedure.    **This guidance is not considered finalized until pre-operative APP has relayed final recommendations.**

## 2022-03-04 NOTE — Telephone Encounter (Signed)
   Patient Name: Bryan Cowan  DOB: August 27, 1950 MRN: [id number]  Primary Cardiologist: Larae Grooms, MD  Chart reviewed as part of pre-operative protocol coverage. Given past medical history and time since last visit, based on ACC/AHA guidelines, Bryan Cowan is at acceptable risk for the planned procedure without further cardiovascular testing.   CHA2DS2-VASc Score = 2  This indicates a 2.2% annual risk of stroke. The patient's score is based upon: CHF History: 0 HTN History: 0 Diabetes History: 0 Stroke History: 0 Vascular Disease History: 1 Age Score: 1 Gender Score: 0   CrCl 35m/min Platelet count 230K   Agree that per office protocol, patient can hold Xarelto for 2 days prior to procedure.  I will route this recommendation to the requesting party via Epic fax function and remove from pre-op pool.  Please call with questions.  DMable Fill EMarissa Nestle NP 03/04/2022, 3:30 PM

## 2022-03-13 ENCOUNTER — Encounter: Payer: Self-pay | Admitting: Pulmonary Disease

## 2022-03-13 NOTE — Telephone Encounter (Signed)
Please see mychart message and attachments sent by pt.

## 2022-03-14 ENCOUNTER — Telehealth: Payer: Self-pay | Admitting: Family Medicine

## 2022-03-14 NOTE — Telephone Encounter (Signed)
Left message for patient to call back and schedule Medicare Annual Wellness Visit (AWV) either virtually or in office. Left  my Herbie Drape number [phone]   Last AWV 04/10/21 please schedule with Nurse Health Adviser   45 min for awv-i  in office appointments 30 min for awv-s & awv-i phone/virtual appointments

## 2022-03-15 NOTE — Telephone Encounter (Signed)
Left VM for patient to return call regarding holding Xarelto 2 days prior to his procedure scheduled for 05/06/22.

## 2022-03-18 ENCOUNTER — Encounter: Payer: Self-pay | Admitting: Pulmonary Disease

## 2022-03-18 ENCOUNTER — Ambulatory Visit (INDEPENDENT_AMBULATORY_CARE_PROVIDER_SITE_OTHER): Payer: Medicare Other | Admitting: Pulmonary Disease

## 2022-03-18 VITALS — BP 122/76 | HR 69 | Ht 73.0 in | Wt 205.0 lb

## 2022-03-18 DIAGNOSIS — J449 Chronic obstructive pulmonary disease, unspecified: Secondary | ICD-10-CM

## 2022-03-18 NOTE — Patient Instructions (Addendum)
Continue to use advair 45-70mg 2 puffs twice  - rinse mouth out after each use  Please let uKoreaknow if you need to step up to the moderate dose inhaler for your symptoms  Check with your eye doctor regarding your inhaler and that it is not affecting their management of your glaucoma  Follow up in 1 year

## 2022-03-18 NOTE — Progress Notes (Unsigned)
Synopsis: Referred in November 2022 for abnormal chest x-ray by Arlester Marker, MD  Subjective:   PATIENT ID: Bryan Cowan GENDER: male DOB: [birth year], MRN: [id number]  HPI  Chief Complaint  Patient presents with   Follow-up    6 mo f/u for COPD. States he was off of the Advair for a few months but recently restarted at the lower dose.    Bryan Cowan is a 72 year old man, never smoker with history of prostate cancer and hyperlipidemia who returns to pulmonary clinic for bronchitis and COPD.  He stopped taking the low dose advair in September. Over the last couple of weeks he reports cough with sputum production. He resumed the low dose advair with improvement in the cough and sputum production.   OV 09/10/21 Patient had RSV infection at beginning of month with increase in cough symptoms. He was resumed back on advair 115-73mg 2 puffs twice daily as he was tapered to advair 45-210m at last visit.  He will occasionally have a coughing fit still at night. No exertional dyspnea.   OV 05/2021 He was started on advair 115-2114m2 puffs twice daily at last visit and has noticed a significant improvement in his breathing and cough.  He has stopped taking PPI and done well from that standpoint. He is sleeping with head of bed elevated.   OV 02/05/21 Patient was started on pantoprazole 40 mg daily for history of reflux which she notes improvement in his cough and wheezing since starting this medication.  He has not started ICS/LABA inhaler therapy with Advair as he is waiting for his insurance to change over in the new year.  Pulmonary function tests show mild obstructive defect with normal TLC and normal DLCO.  HRCT chest shows no significant pulmonary parenchymal findings.  There are scattered airways with mild bronchial thickening.  OV 12/20/20 Patient reports developing shortness of breath, cough with clear sputum production and intermittent wheezing over the past 3 to 6 months.  He had a  chest radiograph on 11/27/2020 which showed calcified granuloma of the right upper lobe and calcified right hilar lymph node along with scattered peribronchial thickening.  He is a never smoker but reports being exposed to secondhand smoke exposure from both his parents in childhood.  In the past he has had issues with seasonal allergies but over recent years denies any symptoms from seasonal allergies.  He does report reflux symptoms on a regular basis.  His wife has asked if the timolol eyedrops that he was started on 6 months ago could be contributing to his respiratory symptoms.  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  He denies any significant dust or chemical exposures over the years.  Past Medical History:  Diagnosis Date   Basal cell carcinoma 05/19/2017   behind left ear [id number] artery disease    History of retinal detachment    Hyperlipidemia    Prostate cancer (HCC)Bryan Cowan   Family History  Problem Relation Age of Onset   COPD Mother    Cancer Father        Lung   Cancer Daughter        Melanoma   Cancer Paternal Uncle        Liver   Colon cancer Neg Hx    Esophageal cancer Neg Hx    Prostate cancer Neg Hx    Rectal cancer Neg Hx  Social History   Socioeconomic History   Marital status: Married    Spouse name: Not on file   Number of children: 3   Years of education: Not on file   Highest education level: Not on file  Occupational History   Occupation: Retired- HR Management  Tobacco Use   Smoking status: Never   Smokeless tobacco: Never  Vaping Use   Vaping Use: Never used  Substance and Sexual Activity   Alcohol use: Yes    Comment: Beer per day   Drug use: Never   Sexual activity: Yes  Other Topics Concern   Not on file  Social History Narrative   Moved here from [place], [place] in 05/2015.   Social Determinants of Health   Financial Resource Strain: Low Risk  (04/10/2021)   Overall  Financial Resource Strain (CARDIA)    Difficulty of Paying Living Expenses: Not hard at all  Food Insecurity: No Food Insecurity (04/10/2021)   Hunger Vital Sign    Worried About Running Out of Food in the Last Year: Never true    Ran Out of Food in the Last Year: Never true  Transportation Needs: No Transportation Needs (04/10/2021)   PRAPARE - Hydrologist (Medical): No    Lack of Transportation (Non-Medical): No  Physical Activity: Sufficiently Active (04/10/2021)   Exercise Vital Sign    Days of Exercise per Week: 5 days    Minutes of Exercise per Session: 60 min  Stress: No Stress Concern Present (04/10/2021)   [place]    Feeling of Stress : Not at all  Social Connections: Moderately Isolated (04/10/2021)   Social Connection and Isolation Panel [NHANES]    Frequency of Communication with Friends and Family: Twice a week    Frequency of Social Gatherings with Friends and Family: Twice a week    Attends Religious Services: Never    Marine scientist or Organizations: No    Attends Archivist Meetings: Never    Marital Status: Married  Human resources officer Violence: Not At Risk (04/10/2021)   Humiliation, Afraid, Rape, and Kick questionnaire    Fear of Current or Ex-Partner: No    Emotionally Abused: No    Physically Abused: No    Sexually Abused: No     No Known Allergies   Outpatient Medications Prior to Visit  Medication Sig Dispense Refill   Ascorbic Acid (VITAMIN C) 1000 MG tablet Take 1,000 mg by mouth daily.     b complex vitamins tablet Take 1 tablet by mouth daily.     brimonidine (ALPHAGAN) 0.2 % ophthalmic solution Place 1 drop into the right eye 2 (two) times daily.     cholecalciferol (VITAMIN D3) 25 MCG (1000 UNIT) tablet Take 1,000 Units by mouth daily.     Coenzyme Q10 100 MG capsule Take 100 mg by mouth.      fluticasone-salmeterol (ADVAIR HFA) 115-21  MCG/ACT inhaler Inhale 2 puffs into the lungs 2 (two) times daily. 1 each 6   latanoprost (XALATAN) 0.005 % ophthalmic solution Place 1 drop into both eyes at bedtime.     magnesium 30 MG tablet Take 30 mg by mouth daily.     MILK THISTLE PO Take by mouth daily.     nitroGLYCERIN (NITROSTAT) 0.4 MG SL tablet Place 1 tablet (0.4 mg total) under the tongue every 5 (five) minutes as needed for chest pain. 25 tablet 6   Omega-3  Fatty Acids (FISH OIL) 1000 MG CAPS Take by mouth daily.     rivaroxaban (XARELTO) 20 MG TABS tablet Take 1 tablet (20 mg total) by mouth daily with supper. 90 tablet 3   timolol (TIMOPTIC) 0.5 % ophthalmic solution timolol maleate 0.5 % eye drops  PLACE 1 DROP INTO BOTH EYES TWICE DAILY     aspirin EC 81 MG tablet Take 81 mg by mouth daily. Swallow whole.     No facility-administered medications prior to visit.   Review of Systems  Constitutional:  Negative for chills, fever, malaise/fatigue and weight loss.  HENT:  Negative for congestion, sinus pain and sore throat.   Eyes: Negative.   Respiratory:  Positive for cough and sputum production. Negative for hemoptysis, shortness of breath and wheezing.   Cardiovascular:  Negative for chest pain, palpitations, orthopnea, claudication and leg swelling.  Gastrointestinal:  Negative for abdominal pain, heartburn, nausea and vomiting.  Genitourinary: Negative.   Musculoskeletal:  Negative for joint pain and myalgias.  Skin:  Negative for rash.  Neurological:  Negative for weakness.  Endo/Heme/Allergies: Negative.   Psychiatric/Behavioral: Negative.      Objective:   Vitals:   03/18/22 0957  BP: 122/76  Pulse: 69  SpO2: 99%  Weight: 205 lb (93 kg)  Height: '6\' 1"'$  (1.854 m)   Physical Exam Constitutional:      General: He is not in acute distress. HENT:     Head: Normocephalic and atraumatic.  Eyes:     Conjunctiva/sclera: Conjunctivae normal.  Cardiovascular:     Rate and Rhythm: Normal rate and regular  rhythm.     Pulses: Normal pulses.     Heart sounds: Normal heart sounds. No murmur heard. Pulmonary:     Effort: Pulmonary effort is normal.     Breath sounds: No wheezing, rhonchi or rales.  Musculoskeletal:     Right lower leg: No edema.     Left lower leg: No edema.  Lymphadenopathy:     Cervical: No cervical adenopathy.  Skin:    General: Skin is warm and dry.  Neurological:     General: No focal deficit present.     Mental Status: He is alert.  Psychiatric:        Mood and Affect: Mood normal.        Behavior: Behavior normal.        Thought Content: Thought content normal.        Judgment: Judgment normal.    CBC    Component Value Date/Time   WBC 4.9 08/07/2021 0816   WBC 6.0 11/01/2019 0939   RBC 5.31 08/07/2021 0816   RBC 5.19 11/01/2019 0939   HGB 14.0 08/07/2021 0816   HCT 42.9 08/07/2021 0816   PLT 230 08/07/2021 0816   MCV 81 08/07/2021 0816   MCH 26.4 (L) 08/07/2021 0816   MCH 26.6 11/01/2019 0939   MCHC 32.6 08/07/2021 0816   MCHC 31.7 11/01/2019 0939   RDW 15.9 (H) 08/07/2021 0816   LYMPHSABS 2.0 11/01/2019 0939   MONOABS 0.6 11/01/2019 0939   EOSABS 0.5 11/01/2019 0939   BASOSABS 0.1 11/01/2019 0939      Latest Ref Rng & Units 08/07/2021    8:16 AM 01/17/2021    7:11 AM 11/01/2019    9:39 AM  BMP  Glucose 70 - 99 mg/dL 102  95  102   BUN 8 - 27 mg/dL '9  12  10   '$ Creatinine 0.76 - 1.27 mg/dL 0.82  0.82  0.75   BUN/Creat Ratio 10 - '24 11  15    '$ Sodium 134 - 144 mmol/L 136  138  138   Potassium 3.5 - 5.2 mmol/L 4.1  4.2  4.4   Chloride 96 - 106 mmol/L 100  102  105   CO2 20 - 29 mmol/L '26  25  25   '$ Calcium 8.6 - 10.2 mg/dL 9.4  8.9  8.9    Chest imaging: CXR 11/27/2020 1. Mild diffuse peribronchial cuffing which could be seen in the setting of acute or chronic bronchitis. 2. Old granulomatous disease, as above. 3. Aortic atherosclerosis.  PFT:    Latest Ref Rng & Units 02/05/2021    9:55 AM  PFT Results  FVC-Pre L 3.98    FVC-Predicted Pre % 81   FVC-Post L 4.24   FVC-Predicted Post % 87   Pre FEV1/FVC % % 64   Post FEV1/FCV % % 68   FEV1-Pre L 2.54   FEV1-Predicted Pre % 71   FEV1-Post L 2.89   DLCO uncorrected ml/min/mmHg 26.44   DLCO UNC% % 94   DLCO corrected ml/min/mmHg 26.44   DLCO COR %Predicted % 94   DLVA Predicted % 90   TLC L 8.33   TLC % Predicted % 108   RV % Predicted % 158     Labs:  Path:  Echo:  Heart Catheterization:  Assessment & Plan:   Chronic obstructive pulmonary disease, unspecified COPD type (HCC)  Discussion: Bryan Cowan is a 72 year old man, never smoker with history of prostate cancer and hyperlipidemia who returns to pulmonary clinic for bronchitis/COPD.  He is to continue advair 45-1mg 2 puffs twice daily. If he feels his breathing symptoms are not well controlled he is to let [facility] and we will send in moderate dose Advair prescription. I have instructed him to discuss with his eye doctor regarding his glaucoma and inhaler use.   Follow up in 1 year.  JFreda Jackson MD [facility] Office: [phone]   Current Outpatient Medications:    Ascorbic Acid (VITAMIN C) 1000 MG tablet, Take 1,000 mg by mouth daily., Disp: , Rfl:    b complex vitamins tablet, Take 1 tablet by mouth daily., Disp: , Rfl:    brimonidine (ALPHAGAN) 0.2 % ophthalmic solution, Place 1 drop into the right eye 2 (two) times daily., Disp: , Rfl:    cholecalciferol (VITAMIN D3) 25 MCG (1000 UNIT) tablet, Take 1,000 Units by mouth daily., Disp: , Rfl:    Coenzyme Q10 100 MG capsule, Take 100 mg by mouth. , Disp: , Rfl:    fluticasone-salmeterol (ADVAIR HFA) 115-21 MCG/ACT inhaler, Inhale 2 puffs into the lungs 2 (two) times daily., Disp: 1 each, Rfl: 6   latanoprost (XALATAN) 0.005 % ophthalmic solution, Place 1 drop into both eyes at bedtime., Disp: , Rfl:    magnesium 30 MG tablet, Take 30 mg by mouth daily., Disp: , Rfl:    MILK THISTLE PO, Take by mouth  daily., Disp: , Rfl:    nitroGLYCERIN (NITROSTAT) 0.4 MG SL tablet, Place 1 tablet (0.4 mg total) under the tongue every 5 (five) minutes as needed for chest pain., Disp: 25 tablet, Rfl: 6   Omega-3 Fatty Acids (FISH OIL) 1000 MG CAPS, Take by mouth daily., Disp: , Rfl:    rivaroxaban (XARELTO) 20 MG TABS tablet, Take 1 tablet (20 mg total) by mouth daily with supper., Disp: 90 tablet, Rfl: 3   timolol (TIMOPTIC)  0.5 % ophthalmic solution, timolol maleate 0.5 % eye drops  PLACE 1 DROP INTO BOTH EYES TWICE DAILY, Disp: , Rfl:

## 2022-03-21 ENCOUNTER — Encounter: Payer: Self-pay | Admitting: Pulmonary Disease

## 2022-04-01 ENCOUNTER — Ambulatory Visit: Payer: Medicare Other | Attending: Interventional Cardiology

## 2022-04-01 ENCOUNTER — Encounter: Payer: Self-pay | Admitting: Pulmonary Disease

## 2022-04-01 DIAGNOSIS — I25119 Atherosclerotic heart disease of native coronary artery with unspecified angina pectoris: Secondary | ICD-10-CM | POA: Diagnosis not present

## 2022-04-01 DIAGNOSIS — I700 Atherosclerosis of aorta: Secondary | ICD-10-CM | POA: Diagnosis not present

## 2022-04-01 DIAGNOSIS — E782 Mixed hyperlipidemia: Secondary | ICD-10-CM | POA: Diagnosis not present

## 2022-04-01 DIAGNOSIS — R7301 Impaired fasting glucose: Secondary | ICD-10-CM | POA: Diagnosis not present

## 2022-04-01 DIAGNOSIS — I480 Paroxysmal atrial fibrillation: Secondary | ICD-10-CM

## 2022-04-01 DIAGNOSIS — I77819 Aortic ectasia, unspecified site: Secondary | ICD-10-CM

## 2022-04-01 LAB — CBC

## 2022-04-01 NOTE — Telephone Encounter (Signed)
Mychart message sent by pt: Bryan Cowan  P [facility] Pool (supporting Freddi Starr, MD)50 minutes ago (3:39 PM)    Dr Erin Fulling, As a follow-up to our discussions at my last appointment,  I would appreciate if you would authorize the filing of the above Rx. Thanks in advance for your support and care! Sincerely, Bryan Cowan     Routing to prior [place] team for review.

## 2022-04-02 ENCOUNTER — Ambulatory Visit (AMBULATORY_SURGERY_CENTER): Payer: Medicare Other

## 2022-04-02 ENCOUNTER — Encounter: Payer: Medicare Other | Admitting: Internal Medicine

## 2022-04-02 VITALS — Ht 73.0 in | Wt 201.0 lb

## 2022-04-02 DIAGNOSIS — Z86010 Personal history of colonic polyps: Secondary | ICD-10-CM

## 2022-04-02 LAB — LIPID PANEL
Chol/HDL Ratio: 4.3 ratio (ref 0.0–5.0)
Cholesterol, Total: 238 mg/dL — ABNORMAL HIGH (ref 100–199)
HDL: 56 mg/dL (ref >39)
LDL Chol Calc (NIH): 166 mg/dL — ABNORMAL HIGH (ref 0–99)
Triglycerides: 93 mg/dL (ref 0–149)
VLDL Cholesterol Cal: 16 mg/dL (ref 5–40)

## 2022-04-02 LAB — COMPREHENSIVE METABOLIC PANEL
ALT: 14 IU/L (ref 0–44)
AST: 26 IU/L (ref 0–40)
Albumin/Globulin Ratio: 2.3 — ABNORMAL HIGH (ref 1.2–2.2)
Albumin: 4.4 g/dL (ref 3.8–4.8)
Alkaline Phosphatase: 60 IU/L (ref 44–121)
BUN/Creatinine Ratio: 18 (ref 10–24)
BUN: 14 mg/dL (ref 8–27)
Bilirubin Total: 0.8 mg/dL (ref 0.0–1.2)
CO2: 25 mmol/L (ref 20–29)
Calcium: 9.3 mg/dL (ref 8.6–10.2)
Chloride: 100 mmol/L (ref 96–106)
Creatinine, Ser: 0.79 mg/dL (ref 0.76–1.27)
Globulin, Total: 1.9 g/dL (ref 1.5–4.5)
Glucose: 98 mg/dL (ref 70–99)
Potassium: 3.9 mmol/L (ref 3.5–5.2)
Sodium: 137 mmol/L (ref 134–144)
Total Protein: 6.3 g/dL (ref 6.0–8.5)
eGFR: 94 mL/min/{1.73_m2} (ref >59)

## 2022-04-02 LAB — CBC
Hematocrit: 42.6 % (ref 37.5–51.0)
Hemoglobin: 13.5 g/dL (ref 13.0–17.7)
MCH: 26.9 pg (ref 26.6–33.0)
MCHC: 31.7 g/dL (ref 31.5–35.7)
MCV: 85 fL (ref 79–97)
Platelets: 205 10*3/uL (ref 150–450)
RBC: 5.01 x10E6/uL (ref 4.14–5.80)
RDW: 13.1 % (ref 11.6–15.4)
WBC: 5 10*3/uL (ref 3.4–10.8)

## 2022-04-02 NOTE — Progress Notes (Signed)
No egg or soy allergy known to patient  No issues known to pt with past sedation with any surgeries or procedures Patient denies ever being told they had issues or difficulty with intubation  No FH of Malignant Hyperthermia Pt is not on diet pills Pt is not on  home 02  Pt is not on blood thinners  Pt denies issues with constipation  No A fib or A flutter Have any cardiac testing pending--no Pt instructed to use [email or web address] or GoodRx for a price reduction on prep   

## 2022-04-03 ENCOUNTER — Other Ambulatory Visit (HOSPITAL_COMMUNITY): Payer: Self-pay

## 2022-04-03 NOTE — Telephone Encounter (Signed)
Medication is covered at this time per benefits investigation.

## 2022-04-04 ENCOUNTER — Other Ambulatory Visit: Payer: Self-pay | Admitting: *Deleted

## 2022-04-04 DIAGNOSIS — E782 Mixed hyperlipidemia: Secondary | ICD-10-CM

## 2022-04-04 NOTE — Progress Notes (Signed)
Lipid clinic referral order.

## 2022-04-08 ENCOUNTER — Other Ambulatory Visit: Payer: Self-pay | Admitting: Pulmonary Disease

## 2022-04-08 DIAGNOSIS — R059 Cough, unspecified: Secondary | ICD-10-CM

## 2022-04-08 DIAGNOSIS — J40 Bronchitis, not specified as acute or chronic: Secondary | ICD-10-CM

## 2022-04-08 MED ORDER — FLUTICASONE-SALMETEROL 115-21 MCG/ACT IN AERO: 2.0000 | INHALATION_SPRAY | Freq: Two times a day (BID) | RESPIRATORY_TRACT | 6 refills | Status: DC

## 2022-04-08 NOTE — Addendum Note (Signed)
Addended by: Lorretta Harp on: 04/08/2022 01:20 PM   Modules accepted: Orders

## 2022-04-10 ENCOUNTER — Telehealth: Payer: Self-pay | Admitting: Internal Medicine

## 2022-04-10 NOTE — Telephone Encounter (Signed)
Patient rescheduled procedure. Please update prep in MyChart

## 2022-04-10 NOTE — Telephone Encounter (Signed)
Pt reschedule outside of 60 period of last PV on 2/13. He will need to be rescheduled for another PV as well unless he reschedules his procedure prior to Friday 04/30/22. Thank you

## 2022-04-12 ENCOUNTER — Ambulatory Visit (INDEPENDENT_AMBULATORY_CARE_PROVIDER_SITE_OTHER): Payer: Medicare Other

## 2022-04-12 VITALS — Ht 73.0 in | Wt 202.0 lb

## 2022-04-12 DIAGNOSIS — Z0000 Encounter for general adult medical examination without abnormal findings: Secondary | ICD-10-CM | POA: Diagnosis not present

## 2022-04-12 NOTE — Progress Notes (Signed)
I connected with  Ripley Stadnik on 04/12/22 by a audio enabled telemedicine application and verified that I am speaking with the correct person using two identifiers.  Patient Location: Home  Provider Location: Office/Clinic  I discussed the limitations of evaluation and management by telemedicine. The patient expressed understanding and agreed to proceed.  Subjective:   Bryan Cowan is a 72 y.o. male who presents for Medicare Annual/Subsequent preventive examination.  Review of Systems     Cardiac Risk Factors include: advanced age (>24mn, >>80women);dyslipidemia;male gender     Objective:    Today's Vitals   04/12/22 0941  Weight: 202 lb (91.6 kg)  Height: '6\' 1"'$  (1.854 m)   Body mass index is 26.65 kg/m.     04/12/2022    9:44 AM 04/10/2021    2:19 PM 02/07/2021   11:36 AM 02/29/2020    8:22 AM 11/04/2019   12:41 PM 10/29/2019   10:45 AM 02/24/2019    3:14 PM  Advanced Directives  Does Patient Have a Medical Advance Directive? Yes Yes No Yes Yes Yes Yes  Type of AParamedicof [place]DeshaLiving will HH[place]villeLiving will  HFair OaksLiving will  HO[place]Living will [place]tee[place] CreekLiving will  Does patient want to make changes to medical advance directive?     No - Guardian declined No - Patient declined No - Patient declined  Copy of [place]ake [place] Chart? Yes - validated most recent copy scanned in chart (See row information) No - copy requested  No - copy requested  No - copy requested No - copy requested  Would patient like information on creating a medical advance directive?   No - Patient declined  No - Patient declined      Current Medications (verified) Outpatient Encounter Medications as of 04/12/2022  Medication Sig   Ascorbic Acid (VITAMIN C) 1000 MG tablet Take 1,000 mg by mouth daily.   b complex vitamins tablet Take 1 tablet by mouth daily.   brimonidine  (ALPHAGAN) 0.2 % ophthalmic solution Place 1 drop into the right eye 2 (two) times daily.   cholecalciferol (VITAMIN D3) 25 MCG (1000 UNIT) tablet Take 1,000 Units by mouth daily.   Coenzyme Q10 100 MG capsule Take 100 mg by mouth.    fluticasone-salmeterol (ADVAIR HFA) 115-21 MCG/ACT inhaler Inhale 2 puffs into the lungs 2 (two) times daily.   latanoprost (XALATAN) 0.005 % ophthalmic solution Place 1 drop into both eyes at bedtime.   magnesium 30 MG tablet Take 30 mg by mouth daily.   MILK THISTLE PO Take by mouth daily.   nitroGLYCERIN (NITROSTAT) 0.4 MG SL tablet Place 1 tablet (0.4 mg total) under the tongue every 5 (five) minutes as needed for chest pain.   Omega-3 Fatty Acids (FISH OIL) 1000 MG CAPS Take by mouth daily.   rivaroxaban (XARELTO) 20 MG TABS tablet Take 1 tablet (20 mg total) by mouth daily with supper.   timolol (TIMOPTIC) 0.5 % ophthalmic solution timolol maleate 0.5 % eye drops  PLACE 1 DROP INTO BOTH EYES TWICE DAILY   No facility-administered encounter medications on file as of 04/12/2022.    Allergies (verified) Patient has no known allergies.   History: Past Medical History:  Diagnosis Date   Basal cell carcinoma 05/19/2017   behind left ear [id number]  Coronary artery disease    History of retinal detachment    Hyperlipidemia    [place]  Past Surgical History:  Procedure Laterality Date   BASAL CELL CARCINOMA EXCISION  2019   COLONOSCOPY  2013   INGUINAL HERNIA REPAIR Left 11/04/2019   Procedure: LEFT INGUINAL HERNIA REPAIR WITH MESH;  Surgeon: Erroll Luna, MD;  Location: [facility];  Service: General;  Laterality: Left;   INGUINAL HERNIA REPAIR Right 02/07/2021   Procedure: RIGHT INGUINAL HERNIA REPAIR WITH MESH;  Surgeon: Erroll Luna, MD;  Location: [facility];  Service: General;  Laterality: Right;   INSERTION OF MESH Left 11/04/2019   Procedure: INSERTION OF MESH;  Surgeon: Erroll Luna, MD;   Location: [facility];  Service: General;  Laterality: Left;   INSERTION OF MESH Right 02/07/2021   Procedure: INSERTION OF MESH;  Surgeon: Erroll Luna, MD;  Location: [facility];  Service: General;  Laterality: Right;   PROSTATECTOMY  2004   RETINAL DETACHMENT SURGERY Right 2015   RETINAL LASER PROCEDURE  2017   SIGMOIDOSCOPY  2000   TOTAL KNEE ARTHROPLASTY Bilateral 2009   Family History  Problem Relation Age of Onset   COPD Mother    Cancer Father        Lung   Cancer Daughter        Melanoma   Cancer Paternal Uncle        Liver   Colon cancer Neg Hx    Esophageal cancer Neg Hx    Prostate cancer Neg Hx    Rectal cancer Neg Hx    Social History   Socioeconomic History   Marital status: Married    Spouse name: Not on file   Number of children: 3   Years of education: Not on file   Highest education level: Not on file  Occupational History   Occupation: Retired- HR Management  Tobacco Use   Smoking status: Never   Smokeless tobacco: Never  Vaping Use   Vaping Use: Never used  Substance and Sexual Activity   Alcohol use: Yes    Comment: Beer per day   Drug use: Never   Sexual activity: Yes  Other Topics Concern   Not on file  Social History Narrative   Moved here from [place], [place] in 05/2015.   Social Determinants of Health   Financial Resource Strain: Low Risk  (04/12/2022)   Overall Financial Resource Strain (CARDIA)    Difficulty of Paying Living Expenses: Not hard at all  Food Insecurity: No Food Insecurity (04/12/2022)   Hunger Vital Sign    Worried About Running Out of Food in the Last Year: Never true    Ran Out of Food in the Last Year: Never true  Transportation Needs: No Transportation Needs (04/12/2022)   PRAPARE - Hydrologist (Medical): No    Lack of Transportation (Non-Medical): No  Physical Activity: Sufficiently Active (04/12/2022)   Exercise Vital Sign    Days of Exercise per  Week: 7 days    Minutes of Exercise per Session: 80 min  Stress: No Stress Concern Present (04/12/2022)   [place]    Feeling of Stress : Only a little  Social Connections: Moderately Isolated (04/10/2021)   Social Connection and Isolation Panel [NHANES]    Frequency of Communication with Friends and Family: Twice a week    Frequency of Social Gatherings with Friends and Family: Twice a week    Attends Religious Services: Never    Marine scientist or Organizations:  No    Attends Archivist Meetings: Never    Marital Status: Married    Tobacco Counseling Counseling given: Not Answered   Clinical Intake:  Pre-visit preparation completed: Yes  Pain : No/denies pain     Nutritional Status: BMI 25 -29 Overweight Nutritional Risks: None Diabetes: No  How often do you need to have someone help you when you read instructions, pamphlets, or other written materials from your doctor or pharmacy?: 1 - Never  Diabetic? no  Interpreter Needed?: No  Information entered by :: NAllen LPN   Activities of Daily Living    04/12/2022    9:46 AM 04/11/2022    3:01 PM  In your present state of health, do you have any difficulty performing the following activities:  Hearing? 1 1  Vision? 1 1  Difficulty concentrating or making decisions? 0 0  Walking or climbing stairs? 0 0  Dressing or bathing? 0 0  Doing errands, shopping? 0 0  Preparing Food and eating ? N N  Using the Toilet? N N  In the past six months, have you accidently leaked urine? N N  Do you have problems with loss of bowel control? N N  Managing your Medications? N N  Managing your Finances? N N  Housekeeping or managing your Housekeeping? N N    Patient Care Team: Haydee Salter, MD as PCP - General (Family Medicine) Jettie Booze, MD as PCP - Cardiology (Cardiology) Garrel Ridgel, [place] as Consulting Physician  (Podiatry) Lavonna Monarch, MD (Inactive) as Consulting Physician (Dermatology) Zadie Rhine Clent Demark, MD as Consulting Physician (Ophthalmology) Warden Fillers, MD as Consulting Physician (Ophthalmology) Erroll Luna, MD as Consulting Physician (General Surgery)  Indicate any recent Medical Services you may have received from other than [facility] providers in the past year (date may be approximate).     Assessment:   This is a routine wellness examination for Ramin.  Hearing/Vision screen Vision Screening - Comments:: Regular eye exams, [facility], Dr. Nevada Crane  Dietary issues and exercise activities discussed: Current Exercise Habits: Home exercise routine, Type of exercise: walking, Time (Minutes): > 60, Frequency (Times/Week): 7, Weekly Exercise (Minutes/Week): 0   Goals Addressed             This Visit's Progress    Patient Stated       04/12/2022, continue to lose weight       Depression Screen    04/12/2022    9:46 AM 04/10/2021    2:20 PM 02/29/2020    8:25 AM 02/08/2020    8:07 AM 02/24/2019    3:18 PM 02/04/2018    8:15 AM  PHQ 2/9 Scores  PHQ - 2 Score 0 0 0 0 0 0    Fall Risk    04/12/2022    9:45 AM 04/11/2022    3:01 PM 04/10/2021    2:20 PM 02/29/2020    8:24 AM 02/08/2020    8:07 AM  [place] in the past year? 1 1 0 0 0  Comment got tripped with dog      Number falls in past yr: 0 0 0 0   Injury with Fall? 0 0 0 0   Risk for fall due to : Medication side effect;Impaired vision  No Fall Risks    Follow up Falls prevention discussed;Education provided;Falls evaluation completed  Falls evaluation completed Falls prevention discussed     FALL RISK PREVENTION PERTAINING TO THE HOME:  Any stairs  in or around the home? No  If so, are there any without handrails? N/a Home free of loose throw rugs in walkways, pet beds, electrical cords, etc? Yes  Adequate lighting in your home to reduce risk of falls? Yes   ASSISTIVE DEVICES UTILIZED TO PREVENT  FALLS:  Life alert? No  Use of a cane, walker or w/c? No  Grab bars in the bathroom? Yes  Shower chair or bench in shower? Yes  Elevated toilet seat or a handicapped toilet? Yes   TIMED UP AND GO:  Was the test performed? No .      Cognitive Function:    02/04/2018    8:34 AM  MMSE - Mini Mental State Exam  Orientation to time 5  Orientation to Place 5  Registration 3  Attention/ Calculation 5  Recall 2  Language- name 2 objects 2  Language- repeat 1  Language- follow 3 step command 3  Language- read & follow direction 1  Write a sentence 1  Copy design 1  Total score 29        04/12/2022    9:47 AM  6CIT Screen  What Year? 0 points  What month? 0 points  What time? 0 points  Count back from 20 0 points  Months in reverse 0 points  Repeat phrase 0 points  Total Score 0 points    Immunizations Immunization History  Administered Date(s) Administered   Fluad Quad(high Dose 65+) 12/02/2018, 11/27/2020   Influenza Split 12/03/2019   Influenza, High Dose Seasonal PF 11/19/2015, 01/13/2018   Moderna Sars-Covid-2 Vaccination 04/02/2019, 04/30/2019, 12/21/2019   Pneumococcal Conjugate-13 03/05/2016   Pneumococcal Polysaccharide-23 01/13/2018   Tdap 06/19/2015   Zoster Recombinat (Shingrix) 12/27/2021   Zoster, Live 02/28/2009    TDAP status: Up to date  Flu Vaccine status: Up to date  Pneumococcal vaccine status: Up to date  Covid-19 vaccine status: Completed vaccines  Qualifies for Shingles Vaccine? Yes   Zostavax completed Yes   Shingrix Completed?: Yes  Screening Tests Health Maintenance  Topic Date Due   INFLUENZA VACCINE  09/18/2021   COVID-19 Vaccine (4 - 2023-24 season) 10/19/2021   COLONOSCOPY (Pts 45-60yr Insurance coverage will need to be confirmed)  02/07/2022   Zoster Vaccines- Shingrix (2 of 2) 02/21/2022   Medicare Annual Wellness (AWV)  04/10/2022   DTaP/Tdap/Td (2 - Td or Tdap) 06/18/2025   Pneumonia Vaccine 74 Years old   Completed   HPV VACCINES  Aged Out   Hepatitis C Screening  Discontinued    Health Maintenance  Health Maintenance Due  Topic Date Due   INFLUENZA VACCINE  09/18/2021   COVID-19 Vaccine (4 - 2023-24 season) 10/19/2021   COLONOSCOPY (Pts 45-471yrInsurance coverage will need to be confirmed)  02/07/2022   Zoster Vaccines- Shingrix (2 of 2) 02/21/2022   Medicare Annual Wellness (AWV)  04/10/2022    Colorectal cancer screening: scheduled for 06/05/2022  Lung Cancer Screening: (Low Dose CT Chest recommended if Age 74-80ears, 30 pack-year currently smoking OR have quit w/in 15years.) does not qualify.   Lung Cancer Screening Referral: no  Additional Screening:  Hepatitis C Screening: does not qualify;   Vision Screening: Recommended annual ophthalmology exams for early detection of glaucoma and other disorders of the eye. Is the patient up to date with their annual eye exam?  Yes  Who is the provider or what is the name of the office in which the patient attends annual [facility] exams? [facility] If pt is  not established with a provider, would they like to be referred to a provider to establish care? No .   Dental Screening: Recommended annual dental exams for proper oral hygiene  Community Resource Referral / Chronic Care Management: CRR required this visit?  No   CCM required this visit?  No      Plan:     I have personally reviewed and noted the following in the patient's chart:   Medical and social history Use of alcohol, tobacco or illicit drugs  Current medications and supplements including opioid prescriptions. Patient is not currently taking opioid prescriptions. Functional ability and status Nutritional status Physical activity Advanced directives List of other physicians Hospitalizations, surgeries, and ER visits in previous 12 months Vitals Screenings to include cognitive, depression, and falls Referrals and appointments  In addition, I have reviewed and  discussed with patient certain preventive protocols, quality metrics, and best practice recommendations. A written personalized care plan for preventive services as well as general preventive health recommendations were provided to patient.     Kellie Simmering, LPN   [id number]   Nurse Notes: Due to this being a virtual visit, the after visit summary with patients personalized plan was offered to patient via mail or my-chart. Patient would like to access on my-chart

## 2022-04-12 NOTE — Patient Instructions (Signed)
Mr. Bryan Cowan , Thank you for taking time to come for your Medicare Wellness Visit. I appreciate your ongoing commitment to your health goals. Please review the following plan we discussed and let me know if I can assist you in the future.   These are the goals we discussed:  Goals      Maintain healthy active lifestyle.     Patient Stated     Improve balance     Patient Stated     04/12/2022, continue to lose weight        This is a list of the screening recommended for you and due dates:  Health Maintenance  Topic Date Due   Flu Shot  09/18/2021   COVID-19 Vaccine (4 - 2023-24 season) 10/19/2021   Colon Cancer Screening  02/07/2022   Zoster (Shingles) Vaccine (2 of 2) 02/21/2022   Medicare Annual Wellness Visit  04/13/2023   DTaP/Tdap/Td vaccine (2 - Td or Tdap) 06/18/2025   Pneumonia Vaccine  Completed   HPV Vaccine  Aged Out   Hepatitis C Screening: USPSTF Recommendation to screen - Ages 2-79 yo.  Discontinued    Advanced directives: copy in chart  Conditions/risks identified: none  Next appointment: Follow up in one year for your annual wellness visit.   Preventive Care 74 Years and Older, Male  Preventive care refers to lifestyle choices and visits with your health care provider that can promote health and wellness. What does preventive care include? A yearly physical exam. This is also called an annual well check. Dental exams once or twice a year. Routine eye exams. Ask your health care provider how often you should have your eyes checked. Personal lifestyle choices, including: Daily care of your teeth and gums. Regular physical activity. Eating a healthy diet. Avoiding tobacco and drug use. Limiting alcohol use. Practicing safe sex. Taking low doses of aspirin every day. Taking vitamin and mineral supplements as recommended by your health care provider. What happens during an annual well check? The services and screenings done by your health care provider  during your annual well check will depend on your age, overall health, lifestyle risk factors, and family history of disease. Counseling  Your health care provider may ask you questions about your: Alcohol use. Tobacco use. Drug use. Emotional well-being. Home and relationship well-being. Sexual activity. Eating habits. History of falls. Memory and ability to understand (cognition). Work and work Statistician. Screening  You may have the following tests or measurements: Height, weight, and BMI. Blood pressure. Lipid and cholesterol levels. These may be checked every 5 years, or more frequently if you are over 71 years old. Skin check. Lung cancer screening. You may have this screening every year starting at age 12 if you have a 30-pack-year history of smoking and currently smoke or have quit within the past 15 years. Fecal occult blood test (FOBT) of the stool. You may have this test every year starting at age 19. Flexible sigmoidoscopy or colonoscopy. You may have a sigmoidoscopy every 5 years or a colonoscopy every 10 years starting at age 60. Prostate cancer screening. Recommendations will vary depending on your family history and other risks. Hepatitis C blood test. Hepatitis B blood test. Sexually transmitted disease (STD) testing. Diabetes screening. This is done by checking your blood sugar (glucose) after you have not eaten for a while (fasting). You may have this done every 1-3 years. Abdominal aortic aneurysm (AAA) screening. You may need this if you are a current or former smoker. Osteoporosis.  You may be screened starting at age 42 if you are at high risk. Talk with your health care provider about your test results, treatment options, and if necessary, the need for more tests. Vaccines  Your health care provider may recommend certain vaccines, such as: Influenza vaccine. This is recommended every year. Tetanus, diphtheria, and acellular pertussis (Tdap, Td) vaccine. You  may need a Td booster every 10 years. Zoster vaccine. You may need this after age 99. Pneumococcal 13-valent conjugate (PCV13) vaccine. One dose is recommended after age 48. Pneumococcal polysaccharide (PPSV23) vaccine. One dose is recommended after age 62. Talk to your health care provider about which screenings and vaccines you need and how often you need them. This information is not intended to replace advice given to you by your health care provider. Make sure you discuss any questions you have with your health care provider. Document Released: 03/03/2015 Document Revised: 10/25/2015 Document Reviewed: 12/06/2014 Elsevier Interactive Patient Education  2017 [place] Prevention in the Home Falls can cause injuries. They can happen to people of all ages. There are many things you can do to make your home safe and to help prevent falls. What can I do on the outside of my home? Regularly fix the edges of walkways and driveways and fix any cracks. Remove anything that might make you trip as you walk through a door, such as a raised step or threshold. Trim any bushes or trees on the path to your home. Use bright outdoor lighting. Clear any walking paths of anything that might make someone trip, such as rocks or tools. Regularly check to see if handrails are loose or broken. Make sure that both sides of any steps have handrails. Any raised decks and porches should have guardrails on the edges. Have any leaves, snow, or ice cleared regularly. Use sand or salt on walking paths during winter. Clean up any spills in your garage right away. This includes oil or grease spills. What can I do in the bathroom? Use night lights. Install grab bars by the toilet and in the tub and shower. Do not use towel bars as grab bars. Use non-skid mats or decals in the tub or shower. If you need to sit down in the shower, use a plastic, non-slip stool. Keep the floor dry. Clean up any water that spills  on the floor as soon as it happens. Remove soap buildup in the tub or shower regularly. Attach bath mats securely with double-sided non-slip rug tape. Do not have throw rugs and other things on the floor that can make you trip. What can I do in the bedroom? Use night lights. Make sure that you have a light by your bed that is easy to reach. Do not use any sheets or blankets that are too big for your bed. They should not hang down onto the floor. Have a firm chair that has side arms. You can use this for support while you get dressed. Do not have throw rugs and other things on the floor that can make you trip. What can I do in the kitchen? Clean up any spills right away. Avoid walking on wet floors. Keep items that you use a lot in easy-to-reach places. If you need to reach something above you, use a strong step stool that has a grab bar. Keep electrical cords out of the way. Do not use floor polish or wax that makes floors slippery. If you must use wax, use non-skid floor wax.  Do not have throw rugs and other things on the floor that can make you trip. What can I do with my stairs? Do not leave any items on the stairs. Make sure that there are handrails on both sides of the stairs and use them. Fix handrails that are broken or loose. Make sure that handrails are as long as the stairways. Check any carpeting to make sure that it is firmly attached to the stairs. Fix any carpet that is loose or worn. Avoid having throw rugs at the top or bottom of the stairs. If you do have throw rugs, attach them to the floor with carpet tape. Make sure that you have a light switch at the top of the stairs and the bottom of the stairs. If you do not have them, ask someone to add them for you. What else can I do to help prevent falls? Wear shoes that: Do not have high heels. Have rubber bottoms. Are comfortable and fit you well. Are closed at the toe. Do not wear sandals. If you use a stepladder: Make  sure that it is fully opened. Do not climb a closed stepladder. Make sure that both sides of the stepladder are locked into place. Ask someone to hold it for you, if possible. Clearly mark and make sure that you can see: Any grab bars or handrails. First and last steps. Where the edge of each step is. Use tools that help you move around (mobility aids) if they are needed. These include: Canes. Walkers. Scooters. Crutches. Turn on the lights when you go into a dark area. Replace any light bulbs as soon as they burn out. Set up your furniture so you have a clear path. Avoid moving your furniture around. If any of your floors are uneven, fix them. If there are any pets around you, be aware of where they are. Review your medicines with your doctor. Some medicines can make you feel dizzy. This can increase your chance of falling. Ask your doctor what other things that you can do to help prevent falls. This information is not intended to replace advice given to you by your health care provider. Make sure you discuss any questions you have with your health care provider. Document Released: 12/01/2008 Document Revised: 07/13/2015 Document Reviewed: 03/11/2014 Elsevier Interactive Patient Education  2017 [employer].

## 2022-05-02 ENCOUNTER — Encounter: Payer: Self-pay | Admitting: Interventional Cardiology

## 2022-05-06 ENCOUNTER — Encounter: Payer: Medicare Other | Admitting: Internal Medicine

## 2022-05-07 ENCOUNTER — Encounter: Payer: Self-pay | Admitting: Student

## 2022-05-07 ENCOUNTER — Ambulatory Visit: Payer: Medicare Other | Attending: Cardiovascular Disease | Admitting: Student

## 2022-05-07 DIAGNOSIS — E782 Mixed hyperlipidemia: Secondary | ICD-10-CM | POA: Diagnosis not present

## 2022-05-07 NOTE — Progress Notes (Signed)
Patient ID: Bryan Cowan                 DOB: [birth year]                    MRN: [id number]      HPI: Bryan Cowan is a 72 y.o. male patient referred to lipid clinic by Dr.Varanasi. PMH is significant for CAD, atrial fibriliation, basal cell carcinoma, prostate cancer, HDL, CAC score 811.  Patient presented today with his daughter. Reports many family members ended up having T2DM after starting diabetes. He was prescribed rosuvastatin 20 mg in 2022. He took for 1 month there was no side effects but after hearing family members story of elevated A1c and LFTs  from statins he just D/C the therapy. He is interested learning all the options then he and his daughter will discuss in details at home . His daughter is nurse and wanted to look at the clinical studies.daughter believe in more natural ways of lowering LDLc eg. Niacin, fish oil  Also ,daughter is interested in learning his Lp(a) Apo B level. Per daughter his diet can improve.    Current Medications: Fish oil 1 gm daily (OTC) Previously tried medications: Crestor 20 mg (Dec 2022) Risk Factors: CAD, CAC score 811 (79th percentile)  LDL goal: <70 mg/dl  Last LDLc: 166, HDL: 56, TG: 93, TC: 238 (04/01/2022)  Diet: try to eat low carb diet, drinks 3-4 cups of coffee with half and half, for lunch and dinner- pasta, soups and vegetables, sea food. sweets are his weakness- loves Ice cream and eats it every night   Exercise: walkin:45 min everyday  at gym and very active around the house and takes his dog for walk 30 min in the morning and 60 min in the evening  Dog walks -1:30 mins   Family History:  Father: died from cancer  Mother: COPD, died from colon related problems   Social History:  Alcohol: 7 dinks /week  Smoking: never   Labs: Lipid Panel     Component Value Date/Time   CHOL 238 (H) 04/01/2022 0734   TRIG 93 04/01/2022 0734   HDL 56 04/01/2022 0734   CHOLHDL 4.3 04/01/2022 0734   CHOLHDL 4 11/27/2020 1017   VLDL 20.0  11/27/2020 1017   [place] 166 (H) 04/01/2022 0734   LABVLDL 16 04/01/2022 0734    Past Medical History:  Diagnosis Date   Basal cell carcinoma 05/19/2017   behind left ear cx3 31fu   Coronary artery disease    History of retinal detachment    Hyperlipidemia    Prostate cancer ([place])     Current Outpatient Medications on File Prior to Visit  Medication Sig Dispense Refill   Ascorbic Acid (VITAMIN C) 1000 MG tablet Take 1,000 mg by mouth daily.     b complex vitamins tablet Take 1 tablet by mouth daily.     brimonidine (ALPHAGAN) 0.2 % ophthalmic solution Place 1 drop into the right eye 2 (two) times daily.     cholecalciferol (VITAMIN D3) 25 MCG (1000 UNIT) tablet Take 1,000 Units by mouth daily.     Coenzyme Q10 100 MG capsule Take 100 mg by mouth.      fluticasone-salmeterol (ADVAIR HFA) 115-21 MCG/ACT inhaler Inhale 2 puffs into the lungs 2 (two) times daily. 12 g 6   latanoprost (XALATAN) 0.005 % ophthalmic solution Place 1 drop into both eyes at bedtime.     magnesium 30 MG tablet Take 30 mg  by mouth daily.     MILK THISTLE PO Take by mouth daily.     nitroGLYCERIN (NITROSTAT) 0.4 MG SL tablet Place 1 tablet (0.4 mg total) under the tongue every 5 (five) minutes as needed for chest pain. 25 tablet 6   Omega-3 Fatty Acids (FISH OIL) 1000 MG CAPS Take by mouth daily.     rivaroxaban (XARELTO) 20 MG TABS tablet Take 1 tablet (20 mg total) by mouth daily with supper. 90 tablet 3   timolol (TIMOPTIC) 0.5 % ophthalmic solution timolol maleate 0.5 % eye drops  PLACE 1 DROP INTO BOTH EYES TWICE DAILY     No current facility-administered medications on file prior to visit.    No Known Allergies  Assessment/Plan:  1. Hyperlipidemia -  Problem  Hyperlipidemia    Current Medications: Fish oil 1 gm daily (OTC) Previously tried medications: Crestor 20 mg (Dec 2022) Risk Factors: CAD, CAC score 811 (79th percentile)  LDL goal: <70 mg/dl  Last LDLc: 166, HDL: 56, TG: 93, TC: 238  (04/01/2022)    Hyperlipidemia Assessment/Plan :  LDL goal: < 70 mg/dl last LDLc 166 mg/dl (04/01/2021) Currently not on any lipid lowering agent  Discussed  options (statins, ezetimibe,PCSK-9 inhibitors, bempedoic acid and inclisiran); cost, dosing efficacy, side effects  Patient is aware most insurances covers PCSK9i via PA if the patient either has intolerance to the statins or insufficient response from maximally tolerated 1st line and 2nd line agents ( statin and ezetimibe)  Patient and his daughter wanted to think about discussed options and will inform us via MyChart     Thank you,  Cammy Copa, Pharm.D [facility] of [facility] [address] [place], [place] [address]  Phone: [phone]; Fax: [phone]

## 2022-05-07 NOTE — Assessment & Plan Note (Signed)
Assessment/Plan :  LDL goal: < 70 mg/dl last LDLc 166 mg/dl (04/01/2021) Currently not on any lipid lowering agent  Discussed  options (statins, ezetimibe,PCSK-9 inhibitors, bempedoic acid and inclisiran); cost, dosing efficacy, side effects  Patient is aware most insurances covers PCSK9i via PA if the patient either has intolerance to the statins or insufficient response from maximally tolerated 1st line and 2nd line agents ( statin and ezetimibe)  Patient and his daughter wanted to think about discussed options and will inform us via MyChart

## 2022-05-07 NOTE — Patient Instructions (Signed)
No changes made by your pharmacist Cammy Copa, PharmD at today's visit: Your Results:             Your most recent labs Goal  Total Cholesterol 238 < 200  Triglycerides 93 < 150  HDL (happy/good cholesterol) 56 > 40  LDL (lousy/bad cholesterol) 166 < 70      Praluent is a cholesterol medication that improved your body's ability to get rid of "bad cholesterol" known as LDL. It can lower your LDL up to 60%. It is an injection that is given under the skin every 2 weeks. The most common side effects of Praluent include runny nose, symptoms of the common cold, rarely flu or flu-like symptoms, back/muscle pain in about 3-4% of the patients, and redness, pain, or bruising at the injection site.    Repatha is a cholesterol medication that improved your body's ability to get rid of "bad cholesterol" known as LDL. It can lower your LDL up to 60%! It is an injection that is given under the skin every 2 weeks. The most common side effects of Repatha include runny nose, symptoms of the common cold, rarely flu or flu-like symptoms, back/muscle pain in about 3-4% of the patients, and redness, pain, or bruising at the injection site.   Follow up Lab orders in 2-3 months of starting the therapy        Copay Assistance:  The [employer] foundation offers assistance to help pay for medication copays.  They will cover copays for all cholesterol lowering meds, including statins, fibrates, omega-3 oils, ezetimibe, Repatha, Praluent, Nexletol, Nexlizet.  The cards are usually good for $2,500 or 12 months, whichever comes first. Go to healthwellfoundation.org Click on "Apply Now" Answer questions as to whom is applying (patient or representative) Your disease fund will be "hypercholesterolemia - Medicare access" Select the cholesterol medication you need assistance with (Repatha, Praluent, Nexlizet...) They will ask question about qualifying diagnosis - you can mark "yes"; and do you have insurance coverage.    When they ask what type of assistance you are interested in - "copay assistance" When you submit, the approval is usually within minutes.  You will need to print the card information from the site You will need to show this information to your pharmacy, they will bill your Medicare Part D plan first -then bill [employer] --for the copay.   You can also call them at [phone], although the hold times can be quite long.

## 2022-05-08 DIAGNOSIS — E782 Mixed hyperlipidemia: Secondary | ICD-10-CM | POA: Diagnosis not present

## 2022-05-10 LAB — APOLIPOPROTEIN B: Apolipoprotein B: 98 mg/dL — ABNORMAL HIGH (ref <90)

## 2022-05-10 LAB — LIPOPROTEIN A (LPA): Lipoprotein (a): 181.2 nmol/L — ABNORMAL HIGH (ref <75.0)

## 2022-05-16 ENCOUNTER — Telehealth: Payer: Self-pay | Admitting: *Deleted

## 2022-05-16 ENCOUNTER — Other Ambulatory Visit: Payer: Self-pay

## 2022-05-16 ENCOUNTER — Encounter: Payer: Self-pay | Admitting: Pulmonary Disease

## 2022-05-16 ENCOUNTER — Telehealth: Payer: Self-pay

## 2022-05-16 ENCOUNTER — Encounter: Payer: Self-pay | Admitting: Interventional Cardiology

## 2022-05-16 MED ORDER — NIRMATRELVIR/RITONAVIR (PAXLOVID)TABLET: 3.0000 | ORAL_TABLET | Freq: Two times a day (BID) | ORAL | 0 refills | Status: DC

## 2022-05-16 MED ORDER — NIRMATRELVIR/RITONAVIR (PAXLOVID)TABLET: 3.0000 | ORAL_TABLET | Freq: Two times a day (BID) | ORAL | 0 refills | Status: AC

## 2022-05-16 NOTE — Telephone Encounter (Signed)
All taken care of -

## 2022-05-16 NOTE — Telephone Encounter (Signed)
Please send in paxlovid prescription. If his cough and other symptoms continue to progress please have him call back and we will send in a steroid taper prescription.   He can take Ibuprofen or tylenol as needed based on the directions on the bottle for the fever, headaches and muscle aches.  Thanks, JD

## 2022-05-16 NOTE — Telephone Encounter (Signed)
Called and spoke to patient and let her know that Dr Erin Fulling sent in an rx of paxlovid. Pt verbalized understanding. Nothing further needed

## 2022-05-16 NOTE — Telephone Encounter (Signed)
Mychart message converted to a telephone message:  Dr. Erin Fulling, I experienced mild-moderate common cold symptoms and congestion several weeks ago but had apparently recovered.  I attended a family gathering on March 23. Unfortunately my wife and another family member tested positive for COVID yesterday and I tested positive this morning ( symptoms  include headache, weakness, muscle aches, chills ( but no fever) and coughing.) Because of the productive coughing I have been experiencing over the past 2 weeks, I found that taking Mucinex DM every 12 hours have given me relief from the coughing. I am continuing my Advair as prescribed. I wanted to ask your advice Re the advisability of taking Paxlovid or some other Rx, precautions and recommendations. Thanks in advance for your advice. Sincerely, Bryan Cowan  I called and spoke with the patient, he began having symptoms yesterday 05/15/22, but tested positive today 05/16/22.  He states his mucous is clear.  He is taking his advair, he does not have a rescue inhaler.  He is having some increase sob, but nothing that is severe.  He has an apple watch and states his sats are 94%.  He does not have a pulse oximeter.  He has had a headache as well.  No loss of taste or smell.  Appetite is not a problem.  No GI problems.   Dr. Erin Fulling, please advise.  Thank you.

## 2022-05-16 NOTE — Telephone Encounter (Signed)
Sir,  What dose of the paxlovid do you like to send in  When I type it in like 3 or 4 different medications show up so I want to make sure i sent in the correct one  Thank you

## 2022-05-16 NOTE — Telephone Encounter (Signed)
Pt. Calling back to let dr. Gwyndolyn Kaufman he is having fever 101.5

## 2022-05-21 ENCOUNTER — Telehealth: Payer: Self-pay | Admitting: Family Medicine

## 2022-05-21 ENCOUNTER — Telehealth: Payer: Self-pay

## 2022-05-21 DIAGNOSIS — Z1211 Encounter for screening for malignant neoplasm of colon: Secondary | ICD-10-CM

## 2022-05-21 NOTE — Telephone Encounter (Signed)
Called PT again to see if he could go ahead and speak with PV nurse. He is volunteering at the hospital and calls are being sent to VM. Left VM to call and have PV to keep colonoscopy date on 4/17

## 2022-05-21 NOTE — Telephone Encounter (Signed)
Pt called and like to know can you refer him to a colonoscopy and pt stated he do not want to go to [facility] in [place]. He will go any where else

## 2022-05-21 NOTE — Addendum Note (Signed)
Addended by: Haydee Salter on: 05/21/2022 04:42 PM   Modules accepted: Orders

## 2022-05-21 NOTE — Telephone Encounter (Signed)
Scheduled him an appointment @ 2:20 pm on 05/27/22.Dm/cma

## 2022-05-21 NOTE — Telephone Encounter (Signed)
Reattempt x2 to call pt to complete his PV after his missed apt time of 8:00 AM this morning. Unable to reach pt. VM left for pt to return call to have his PV rescheduled by 5 pm otherwise his PV and procedure would have to be cancelled.

## 2022-05-21 NOTE — Telephone Encounter (Signed)
Another attempt made to call the pt. Left VM. Pt is to have his PV rescheduled if he calls back.

## 2022-05-21 NOTE — Telephone Encounter (Signed)
Attempt to reach pt for pre-visit. LM with call back # and instructions to call by end of work day today to reschedule pre-visit. If pt does not call back and reschedule pre-visit per protocol pre-visit and procedure will be canceled.

## 2022-05-22 NOTE — Telephone Encounter (Signed)
Sheri, pls review patient's message below regarding his concerns and frustration he experienced with trying to reschedule his colonoscopy. THX.

## 2022-05-27 ENCOUNTER — Ambulatory Visit (INDEPENDENT_AMBULATORY_CARE_PROVIDER_SITE_OTHER): Payer: Medicare Other | Admitting: Family Medicine

## 2022-05-27 ENCOUNTER — Encounter: Payer: Self-pay | Admitting: Family Medicine

## 2022-05-27 VITALS — BP 130/72 | HR 85 | Temp 97.2°F | Ht 73.0 in | Wt 199.0 lb

## 2022-05-27 DIAGNOSIS — I480 Paroxysmal atrial fibrillation: Secondary | ICD-10-CM

## 2022-05-27 DIAGNOSIS — I493 Ventricular premature depolarization: Secondary | ICD-10-CM | POA: Insufficient documentation

## 2022-05-27 DIAGNOSIS — J449 Chronic obstructive pulmonary disease, unspecified: Secondary | ICD-10-CM

## 2022-05-27 DIAGNOSIS — E782 Mixed hyperlipidemia: Secondary | ICD-10-CM

## 2022-05-27 DIAGNOSIS — I2510 Atherosclerotic heart disease of native coronary artery without angina pectoris: Secondary | ICD-10-CM

## 2022-05-27 DIAGNOSIS — I4891 Unspecified atrial fibrillation: Secondary | ICD-10-CM | POA: Insufficient documentation

## 2022-05-27 DIAGNOSIS — I712 Thoracic aortic aneurysm, without rupture, unspecified: Secondary | ICD-10-CM | POA: Insufficient documentation

## 2022-05-27 MED ORDER — ROSUVASTATIN CALCIUM 10 MG PO TABS: 10.0000 mg | ORAL_TABLET | Freq: Every day | ORAL | 3 refills | Status: DC

## 2022-05-27 NOTE — Addendum Note (Signed)
Addended by: Tylene Fantasia on: 05/27/2022 04:21 PM   Modules accepted: Orders

## 2022-05-27 NOTE — Assessment & Plan Note (Signed)
As above, being considered for rosuvastatin.

## 2022-05-27 NOTE — Assessment & Plan Note (Signed)
Stable. No chest pain. He should continued focus on optimal medical management. Agree with trial of rosuvastatin for his lipids, but will leave prescribing to cardiology.

## 2022-05-27 NOTE — Assessment & Plan Note (Signed)
Improved with use of ICS/LABA.

## 2022-05-27 NOTE — Progress Notes (Signed)
[facility] [address] [place] [id number] Dept: [phone] Dept Fax: [phone]  Chronic Care Office Visit  Subjective:    Patient ID: Bryan Cowan, male    DOB: 04-Jun-1950, 72 y.o..   MRN: [id number]  Chief Complaint  Patient presents with   Medical Management of Chronic Issues    F/u and wants a colonoscopy referral.    History of Present Illness:  Patient is in today for reassessment of chronic medical issues.  Bryan Cowan had not been in for a visit for the past 16 months. I had referred him for evaluation of a right inguinal hernia and he has since had this repaired.   At his last visit, he was also having a chronic cough and dyspnea on exertion. I had found him to have a 1st degree heart block with bradycardia. I referred him to cardiology. He was identified to have some plaquing on coronary CTA of the left circumflex and diffuse mild disease. He also was idenfied as having some paroxysmal atrial fibrillation on his Apple Watch. He is now managed on rivaroxaban 20 mg daily for stroke prevention.  Bryan Cowan has a history of hyperlipidemia. He is now being considered for starting rosuvastatin to see if he can tolerate this.  Since the last visit, Bryan Cowan was also seen by pulmonology related to his chronic cough and some nodules seen on CT scan. Despite not having a smoking history his PFTs were consistent with COPD. He is currently managed on Advair HFA. He feels his breathing has significantly improved.  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/2019   Left retinal lattice  degeneration 09/13/2019   Primary open angle glaucoma of right eye, severe stage 09/13/2019   Optic disc atrophy, right 09/13/2019   Macular pucker, left eye 09/13/2019   History of retinal detachment 01/13/2018   Vision loss, right eye 01/13/2018   Hyperlipidemia    History of prostate cancer 02/29/2016   Bilateral tinnitus 02/29/2016   Bilateral hearing loss 02/29/2016   Past Surgical History:  Procedure Laterality Date   BASAL CELL CARCINOMA EXCISION  2019   COLONOSCOPY  2013   INGUINAL HERNIA REPAIR Left 11/04/2019   Procedure: LEFT INGUINAL HERNIA REPAIR WITH MESH;  Surgeon: Harriette Bouillonornett, Thomas, MD;  Location: [facility];  Service: General;  Laterality: Left;   INGUINAL HERNIA REPAIR Right 02/07/2021   Procedure: RIGHT INGUINAL HERNIA REPAIR WITH MESH;  Surgeon: Harriette Bouillonornett, Thomas, MD;  Location: [facility];  Service: General;  Laterality: Right;   INSERTION OF MESH Left 11/04/2019   Procedure: INSERTION OF MESH;  Surgeon: Harriette Bouillonornett, Thomas, MD;  Location: [facility];  Service: General;  Laterality: Left;   INSERTION OF MESH Right 02/07/2021   Procedure: INSERTION OF MESH;  Surgeon: Harriette Bouillonornett, Thomas, MD;  Location: [facility];  Service: General;  Laterality: Right;   PROSTATECTOMY  2004   RETINAL DETACHMENT SURGERY Right 2015   RETINAL LASER PROCEDURE  2017   SIGMOIDOSCOPY  2000   TOTAL KNEE ARTHROPLASTY Bilateral 2009   Family History  Problem Relation Age of Onset   COPD Mother    Cancer Father  Lung   Cancer Daughter        Melanoma   Cancer Paternal Uncle        Liver   Colon cancer Neg Hx    Esophageal cancer Neg Hx    Prostate cancer Neg Hx    Rectal cancer Neg Hx    Outpatient Medications Prior to Visit  Medication Sig Dispense Refill   Ascorbic Acid (VITAMIN C) 1000 MG tablet Take 1,000 mg by mouth daily.     b complex vitamins tablet Take 1 tablet by mouth daily.     brimonidine (ALPHAGAN) 0.2 %  ophthalmic solution Place 1 drop into the right eye 2 (two) times daily.     cholecalciferol (VITAMIN D3) 25 MCG (1000 UNIT) tablet Take 1,000 Units by mouth daily.     Coenzyme Q10 100 MG capsule Take 100 mg by mouth.      fluticasone-salmeterol (ADVAIR HFA) 115-21 MCG/ACT inhaler Inhale 2 puffs into the lungs 2 (two) times daily. 12 g 6   latanoprost (XALATAN) 0.005 % ophthalmic solution Place 1 drop into both eyes at bedtime.     magnesium 30 MG tablet Take 30 mg by mouth daily.     MILK THISTLE PO Take by mouth daily.     nitroGLYCERIN (NITROSTAT) 0.4 MG SL tablet Place 1 tablet (0.4 mg total) under the tongue every 5 (five) minutes as needed for chest pain. 25 tablet 6   Omega-3 Fatty Acids (FISH OIL) 1000 MG CAPS Take by mouth daily.     rivaroxaban (XARELTO) 20 MG TABS tablet Take 1 tablet (20 mg total) by mouth daily with supper. 90 tablet 3   timolol (TIMOPTIC) 0.5 % ophthalmic solution timolol maleate 0.5 % eye drops  PLACE 1 DROP INTO BOTH EYES TWICE DAILY     No facility-administered medications prior to visit.   No Known Allergies Objective:   Today's Vitals   05/27/22 1406  BP: 130/72  Pulse: 85  Temp: (!) 97.2 F (36.2 C)  TempSrc: Temporal  SpO2: 97%  Weight: 199 lb (90.3 kg)  Height: 6\' 1"  (1.854 m)   Body mass index is 26.25 kg/m.   General: Well developed, well nourished. No acute distress. HEENT: Normocephalic, non-traumatic. External ears normal. EAC and TMs normal bilaterally. PERRL, EOMI. Conjunctiva clear. Nose    clear without congestion or rhinorrhea. Mucous membranes moist. Oropharynx clear. Good dentition. Neck: Supple. No lymphadenopathy. No thyromegaly. Lungs: Clear to auscultation bilaterally. No wheezing, rales or rhonchi. CV: RRR without murmurs or rubs. Pulses 2+ bilaterally. Extremities: No edema noted. Psych: Alert and oriented. Normal mood and affect.  Health Maintenance Due  Topic Date Due   COLONOSCOPY (Pts 45-42yrs Insurance coverage  will need to be confirmed)  02/07/2022   Zoster Vaccines- Shingrix (2 of 2) 02/21/2022     Assessment & Plan:   Problem List Items Addressed This Visit       Cardiovascular and Mediastinum   Coronary artery disease - Primary    Stable. No chest pain. He should continued focus on optimal medical management. Agree with trial of rosuvastatin for his lipids, but will leave prescribing to cardiology.      Atrial fibrillation    No current palpitations. Continue Xarelto 20 mg daily.        Respiratory   COPD (chronic obstructive pulmonary disease)    Improved with use of ICS/LABA.        Other   Hyperlipidemia    As above, being considered for rosuvastatin.  Return in about 1 year (around 05/27/2023).   Loyola Mast, MD

## 2022-05-27 NOTE — Assessment & Plan Note (Signed)
No current palpitations. Continue Xarelto 20 mg daily.

## 2022-06-03 DIAGNOSIS — H33001 Unspecified retinal detachment with retinal break, right eye: Secondary | ICD-10-CM | POA: Diagnosis not present

## 2022-06-03 DIAGNOSIS — H40022 Open angle with borderline findings, high risk, left eye: Secondary | ICD-10-CM | POA: Diagnosis not present

## 2022-06-03 DIAGNOSIS — Z961 Presence of intraocular lens: Secondary | ICD-10-CM | POA: Diagnosis not present

## 2022-06-03 DIAGNOSIS — H401113 Primary open-angle glaucoma, right eye, severe stage: Secondary | ICD-10-CM | POA: Diagnosis not present

## 2022-06-03 DIAGNOSIS — H35372 Puckering of macula, left eye: Secondary | ICD-10-CM | POA: Diagnosis not present

## 2022-06-04 ENCOUNTER — Encounter: Payer: Self-pay | Admitting: *Deleted

## 2022-06-04 ENCOUNTER — Telehealth: Payer: Self-pay | Admitting: Interventional Cardiology

## 2022-06-04 DIAGNOSIS — M9904 Segmental and somatic dysfunction of sacral region: Secondary | ICD-10-CM | POA: Diagnosis not present

## 2022-06-04 DIAGNOSIS — M9905 Segmental and somatic dysfunction of pelvic region: Secondary | ICD-10-CM | POA: Diagnosis not present

## 2022-06-04 DIAGNOSIS — Z006 Encounter for examination for normal comparison and control in clinical research program: Secondary | ICD-10-CM

## 2022-06-04 DIAGNOSIS — X32XXXD Exposure to sunlight, subsequent encounter: Secondary | ICD-10-CM | POA: Diagnosis not present

## 2022-06-04 DIAGNOSIS — M9903 Segmental and somatic dysfunction of lumbar region: Secondary | ICD-10-CM | POA: Diagnosis not present

## 2022-06-04 DIAGNOSIS — L570 Actinic keratosis: Secondary | ICD-10-CM | POA: Diagnosis not present

## 2022-06-04 DIAGNOSIS — M5136 Other intervertebral disc degeneration, lumbar region: Secondary | ICD-10-CM | POA: Diagnosis not present

## 2022-06-04 NOTE — Telephone Encounter (Signed)
Pt c/o medication issue:  1. Name of Medication: rivaroxaban (XARELTO) 20 MG TABS tablet   2. How are you currently taking this medication (dosage and times per day)? Take 1 tablet (20 mg total) by mouth daily with supper.   3. Are you having a reaction (difficulty breathing--STAT)? No  4. What is your medication issue? Pt would like a callback regarding him stopping this medication for a few days to take Tylenol and/Advil for his back pain. Pt stated he's going to see a chiropractor for the pain as well but wanted to speak with the provider/nurse before stopping any medication or taking anything. Please advise

## 2022-06-04 NOTE — Telephone Encounter (Signed)
I spoke with patient and told him he should not stop Xarelto.  I let him know he should not take Advil or other NSAIDs but it was OK to take tylenol while taking Xarelto.

## 2022-06-04 NOTE — Research (Signed)
Spoke with Bryan Cowan about V1P research. States he would like more information. Emailed a copy of the consents for him to read over. Encourage him to call with any question. Voices understanding.

## 2022-06-05 ENCOUNTER — Encounter: Payer: Medicare Other | Admitting: Internal Medicine

## 2022-06-06 DIAGNOSIS — M9904 Segmental and somatic dysfunction of sacral region: Secondary | ICD-10-CM | POA: Diagnosis not present

## 2022-06-06 DIAGNOSIS — M5136 Other intervertebral disc degeneration, lumbar region: Secondary | ICD-10-CM | POA: Diagnosis not present

## 2022-06-06 DIAGNOSIS — M9903 Segmental and somatic dysfunction of lumbar region: Secondary | ICD-10-CM | POA: Diagnosis not present

## 2022-06-06 DIAGNOSIS — M9905 Segmental and somatic dysfunction of pelvic region: Secondary | ICD-10-CM | POA: Diagnosis not present

## 2022-06-13 ENCOUNTER — Encounter: Payer: Self-pay | Admitting: *Deleted

## 2022-06-13 DIAGNOSIS — Z006 Encounter for examination for normal comparison and control in clinical research program: Secondary | ICD-10-CM

## 2022-06-13 NOTE — Research (Signed)
Message left for Bryan Cowan about V1P. Encouraged him to call me with any questions.

## 2022-06-27 ENCOUNTER — Encounter: Payer: Self-pay | Admitting: *Deleted

## 2022-06-27 DIAGNOSIS — Z006 Encounter for examination for normal comparison and control in clinical research program: Secondary | ICD-10-CM

## 2022-06-27 NOTE — Research (Signed)
Message left for Bryan Cowan about V1p. Encouraged him to call if interested.

## 2022-07-02 ENCOUNTER — Encounter: Payer: Self-pay | Admitting: Family Medicine

## 2022-07-02 ENCOUNTER — Ambulatory Visit (INDEPENDENT_AMBULATORY_CARE_PROVIDER_SITE_OTHER): Payer: Medicare Other | Admitting: Family Medicine

## 2022-07-02 VITALS — BP 120/68 | HR 77 | Temp 98.5°F | Ht 73.0 in | Wt 195.0 lb

## 2022-07-02 DIAGNOSIS — L300 Nummular dermatitis: Secondary | ICD-10-CM | POA: Diagnosis not present

## 2022-07-02 MED ORDER — MOMETASONE FUROATE 0.1 % EX CREA: 1.0000 | TOPICAL_CREAM | Freq: Every day | CUTANEOUS | 0 refills | Status: DC

## 2022-07-02 NOTE — Progress Notes (Signed)
[facility] [address] [place] [address] Dept: [phone] Dept Fax: [phone]  Office Visit  Subjective:    Patient ID: Bryan Cowan, male    DOB: 1950/10/01, 72 y.o..   MRN: [id number]  Chief Complaint  Patient presents with   Joint Swelling    C/o having itchy/red spot on LT elbow x 2 weeks.  Has used hydrocortisone cream   History of Present Illness:  Patient is in today for evaluation of a rash on his left elbow for the past 2 weeks. He notes that this has been itchy. He has not had any drainage. He has tried using HC cream without improvement. He denies leanign on his elbows frequently.  Past Medical History: Patient Active Problem List   Diagnosis Date Noted   Coronary artery disease 05/27/2022   Thoracic aortic aneurysm (HCC) 05/27/2022   Atrial fibrillation (HCC) 05/27/2022   PVCs (premature ventricular contractions) 05/27/2022   COPD (chronic obstructive pulmonary disease) (HCC) 05/27/2022   Aortic atherosclerosis (HCC) 11/27/2020   Gastroesophageal reflux 11/27/2020   First degree heart block 11/27/2020   Granuloma of liver 09/16/2019   Left retinal lattice degeneration 09/13/2019   Primary open angle glaucoma of right eye, severe stage 09/13/2019   Optic disc atrophy, right 09/13/2019   Macular pucker, left eye 09/13/2019   History of retinal detachment 01/13/2018   Vision loss, right eye 01/13/2018   Hyperlipidemia    History of prostate cancer 02/29/2016   Bilateral tinnitus 02/29/2016   Bilateral hearing loss 02/29/2016   Past Surgical History:  Procedure Laterality Date   BASAL CELL CARCINOMA EXCISION  2019   COLONOSCOPY  2013   INGUINAL HERNIA REPAIR Left 11/04/2019   Procedure: LEFT INGUINAL HERNIA REPAIR WITH MESH;  Surgeon: Harriette Bouillon, MD;  Location: [facility];  Service: General;  Laterality: Left;   INGUINAL HERNIA REPAIR Right 02/07/2021   Procedure: RIGHT  INGUINAL HERNIA REPAIR WITH MESH;  Surgeon: Harriette Bouillon, MD;  Location: [facility];  Service: General;  Laterality: Right;   INSERTION OF MESH Left 11/04/2019   Procedure: INSERTION OF MESH;  Surgeon: Harriette Bouillon, MD;  Location: [facility];  Service: General;  Laterality: Left;   INSERTION OF MESH Right 02/07/2021   Procedure: INSERTION OF MESH;  Surgeon: Harriette Bouillon, MD;  Location:  SURGERY CENTER;  Service: General;  Laterality: Right;   PROSTATECTOMY  2004   RETINAL DETACHMENT SURGERY Right 2015   RETINAL LASER PROCEDURE  2017   SIGMOIDOSCOPY  2000   TOTAL KNEE ARTHROPLASTY Bilateral 2009   Family History  Problem Relation Age of Onset   COPD Mother    Cancer Father        Lung   Cancer Daughter        Melanoma   Cancer Paternal Uncle        Liver   Colon cancer Neg Hx    Esophageal cancer Neg Hx    Prostate cancer Neg Hx    Rectal cancer Neg Hx    Outpatient Medications Prior to Visit  Medication Sig Dispense Refill   Ascorbic Acid (VITAMIN C) 1000 MG tablet Take 1,000 mg by mouth daily.     b complex vitamins tablet Take 1 tablet by mouth daily.     brimonidine (ALPHAGAN) 0.2 % ophthalmic solution Place 1 drop into the right eye 2 (two) times daily.     cholecalciferol (VITAMIN D3) 25 MCG (1000 UNIT) tablet  Take 1,000 Units by mouth daily.     Coenzyme Q10 100 MG capsule Take 100 mg by mouth.      fluticasone-salmeterol (ADVAIR HFA) 115-21 MCG/ACT inhaler Inhale 2 puffs into the lungs 2 (two) times daily. 12 g 6   latanoprost (XALATAN) 0.005 % ophthalmic solution Place 1 drop into both eyes at bedtime.     magnesium 30 MG tablet Take 30 mg by mouth daily.     MILK THISTLE PO Take by mouth daily.     nitroGLYCERIN (NITROSTAT) 0.4 MG SL tablet Place 1 tablet (0.4 mg total) under the tongue every 5 (five) minutes as needed for chest pain. 25 tablet 6   Omega-3 Fatty Acids (FISH OIL) 1000 MG CAPS Take by mouth daily.      rivaroxaban (XARELTO) 20 MG TABS tablet Take 1 tablet (20 mg total) by mouth daily with supper. 90 tablet 3   timolol (TIMOPTIC) 0.5 % ophthalmic solution timolol maleate 0.5 % eye drops  PLACE 1 DROP INTO BOTH EYES TWICE DAILY     rosuvastatin (CRESTOR) 10 MG tablet Take 1 tablet (10 mg total) by mouth daily. 90 tablet 3   No facility-administered medications prior to visit.   No Known Allergies   Objective:   Today's Vitals   07/02/22 1603  BP: 120/68  Pulse: 77  Temp: 98.5 F (36.9 C)  TempSrc: Temporal  SpO2: 97%  Weight: 195 lb (88.5 kg)  Height: 6\' 1"  (1.854 m)   Body mass index is 25.73 kg/m.   General: Well developed, well nourished. No acute distress. Skin: Warm and dry. There is a 2 cm round area of mild redness with induration of the skin and a roughened   surface. No fluctuance noted. This is located posterolaterally in the left upper arm, near the elbow. Neuro: CN II-XII intact. Normal sensation and DTR bilaterally. Psych: Alert and oriented. Normal mood and affect.  Health Maintenance Due  Topic Date Due   COLONOSCOPY (Pts 45-48yrs Insurance coverage will need to be confirmed)  02/07/2022   Zoster Vaccines- Shingrix (2 of 2) 02/21/2022     Assessment & Plan:   Problem List Items Addressed This Visit   None Visit Diagnoses     Nummular eczema    -  Primary   Exam consistent with localized eczema. I recommend we use a more potent steroid cream and see if this will resolve.   Relevant Medications   mometasone (ELOCON) 0.1 % cream      Return if symptoms worsen or fail to improve.   Loyola Mast, MD

## 2022-07-02 NOTE — Patient Instructions (Signed)
Use moisturizing lotion liberally on the site of the rash.

## 2022-07-20 ENCOUNTER — Other Ambulatory Visit: Payer: Self-pay | Admitting: Pulmonary Disease

## 2022-07-20 DIAGNOSIS — J449 Chronic obstructive pulmonary disease, unspecified: Secondary | ICD-10-CM

## 2022-07-25 ENCOUNTER — Ambulatory Visit
Admission: RE | Admit: 2022-07-25 | Discharge: 2022-07-25 | Disposition: A | Discharge status: Home / Self Care | Payer: Medicare Other | Source: Ambulatory Visit | Attending: Interventional Cardiology | Admitting: Interventional Cardiology

## 2022-07-25 DIAGNOSIS — I712 Thoracic aortic aneurysm, without rupture, unspecified: Secondary | ICD-10-CM | POA: Diagnosis not present

## 2022-07-25 DIAGNOSIS — I77819 Aortic ectasia, unspecified site: Secondary | ICD-10-CM

## 2022-07-25 MED ORDER — IOPAMIDOL (ISOVUE-370) INJECTION 76%
75.0000 mL | Freq: Once | INTRAVENOUS | Status: AC | PRN
  Administered 2022-07-25: 75 mL via INTRAVENOUS

## 2022-07-29 ENCOUNTER — Other Ambulatory Visit: Payer: Self-pay | Admitting: *Deleted

## 2022-07-29 DIAGNOSIS — I7121 Aneurysm of the ascending aorta, without rupture: Secondary | ICD-10-CM

## 2022-08-01 NOTE — Progress Notes (Signed)
Cardiology Office Note:  .   Date:  08/02/2022  ID:  Bryan Cowan, DOB [birth year], MRN [id number] PCP: Loyola Mast, MD  [facility] Providers Cardiologist:  Lance Muss, MD { History of Present Illness: Bryan Cowan   Bryan Cowan is a 72 y.o. male with a past medical history of basal cell carcinoma, CAD, hyperlipidemia, prostate cancer presenting for follow-up appointment.  He was seen in the clinic 10/2021 and was originally evaluated for dyspnea on exertion back in November 2022.  Had a coronary CTA showing a trileaflet aortic valve and mild calcifications.  Coronary arteries were normal in origin.  Right dominance.  RCA was large and gave rise to the PDA and PLV B.  Mild (25 to 49%) calcified plaque in the proximal to mid RCA with minimal (less than 25%) calcification of plaque distally.  Left main was large and gave rise to LAD, RI, and LCx arteries.  LAD had scattered mild (25 to 29%) calcified plaque.  D1 was a tiny vessel with minimal (less than 25%).  RI was a small vessel with mild (25 to 49%) calcified plaque.  LCx was nondominant artery that gives rise to a tiny OM1 vessel and a mild (25 to 49%) calcified plaque.  Severe (greater than 70%) mixed plaque in the mid LCx.  CT FFR in the OM1 was significant.  He had an Apple Watch which showed atrial fibrillation.  Confirmed by Zio patch.  He was tolerating Xarelto.  No significant cardiac symptoms at the time of last follow-up appointment.  Today, he states that he has not had any issues with atrial fibrillation.  He has been compliant with his medications.  He is on Xarelto 20 mg daily as well as magnesium.  Heart rate is 63 today.  He goes to the gym every day and tries to eat a very heart healthy diet.  We discussed his LDL which is 166.  He has not had much luck with statins.  He does not want to be on any cholesterol medicine at this time.  We did discuss Repatha and Praluent as options as well as Zetia.  I highly recommended he  consider one of our injectable options so that we can get his LDL to goal.  Goal for him would be less than 55 for his LDL.  In order to achieve this time of reduction Repatha/Praluent would be the best option.  We have provided him with some data to review on his own today.  No chest pain, shortness of breath, dizziness, swelling, or palpitations.  Reports no shortness of breath nor dyspnea on exertion. Reports no chest pain, pressure, or tightness. No edema, orthopnea, PND. Reports no palpitations.    ROS: Please refer to HPI for pertinent ROS.  Studies Reviewed: Bryan Cowan    EKG: Normal sinus rhythm, rate 63 bpm  CT angio of the chest  IMPRESSION: Dilatation of the ascending aorta to 4.1 cm. This is stable from the prior exam. Recommend annual imaging followup by CTA or MRA. This recommendation follows 2010 ACCF/AHA/AATS/ACR/ASA/SCA/SCAI/SIR/STS/SVM Guidelines for the Diagnosis and Management of Patients with Thoracic Aortic Disease. Circulation. 2010; 121: W098-J191. Aortic aneurysm NOS (ICD10-I71.9)   Resolution of previously seen ground-glass nodules. No new nodularity is noted.   Changes consistent with prior granulomatous disease.   Aortic Atherosclerosis (ICD10-I70.0). Risk Assessment/Calculations:    CHA2DS2-VASc Score = 2   This indicates a 2.2% annual risk of stroke. The patient's score is based upon: CHF History: 0 HTN History: 0  Diabetes History: 0 Stroke History: 0 Vascular Disease History: 1 Age Score: 1 Gender Score: 0       Physical Exam:   VS:  BP (!) 130/90   Pulse 63   Ht 6\' 1"  (1.854 m)   Wt 196 lb (88.9 kg) Comment: Per patient. Refused to weigh  SpO2 95%   BMI 25.86 kg/m    Wt Readings from Last 3 Encounters:  08/02/22 196 lb (88.9 kg)  07/02/22 195 lb (88.5 kg)  05/27/22 199 lb (90.3 kg)    GEN: Well nourished, well developed in no acute distress NECK: No JVD; No carotid bruits CARDIAC: RRR, no murmurs, rubs, gallops RESPIRATORY:  Clear to  auscultation without rales, wheezing or rhonchi  ABDOMEN: Soft, non-tender, non-distended EXTREMITIES:  No edema; No deformity   ASSESSMENT AND PLAN: .   1.  CAD -Continue current medications which include Xarelto 20 mg daily (not on aspirin), magnesium supplementation, consider adding Repatha/Praluent for better LDL reduction -Continue daily activity -Continue heart healthy, low-sodium diet  2.  Hyperlipidemia -Lipid panel done February 2024 showed LDL 166, HDL 56, triglycerides 93 -Discussed how there is a hereditary component for LDL and in order to achieve greater than 50% reduction in injectable option would be best -Provided literature on Repatha today and patient will let us know if he would like to start this medication -Typically would prefer a lipid panel done in about 8 to 12 weeks after starting Repatha to assess LDL reduction  3.  Thoracic aortic aneurysm -CTA reviewed, stable aortic aneurysm 4.1 cm -Recommend annual imaging so neck CTA/MRA would be due 07/2023 -Continue to keep a close eye on blood pressure, diastolic elevated today which is unusual for him -Encouraged him to keep track of his blood pressure at home an hour after morning medications for 2 weeks  4.  A-fib -Normal sinus rhythm, no recent issues   Dispo: Please follow-up with Dr. Eldridge Dace or me in 6 months.  Signed, Sharlene Dory, PA-C

## 2022-08-02 ENCOUNTER — Encounter: Payer: Self-pay | Admitting: Physician Assistant

## 2022-08-02 ENCOUNTER — Ambulatory Visit: Payer: Medicare Other | Attending: Physician Assistant | Admitting: Physician Assistant

## 2022-08-02 VITALS — BP 130/90 | HR 63 | Ht 73.0 in | Wt 196.0 lb

## 2022-08-02 DIAGNOSIS — I480 Paroxysmal atrial fibrillation: Secondary | ICD-10-CM

## 2022-08-02 DIAGNOSIS — I2510 Atherosclerotic heart disease of native coronary artery without angina pectoris: Secondary | ICD-10-CM

## 2022-08-02 DIAGNOSIS — I493 Ventricular premature depolarization: Secondary | ICD-10-CM | POA: Diagnosis not present

## 2022-08-02 DIAGNOSIS — Z79899 Other long term (current) drug therapy: Secondary | ICD-10-CM | POA: Diagnosis not present

## 2022-08-02 DIAGNOSIS — E785 Hyperlipidemia, unspecified: Secondary | ICD-10-CM | POA: Insufficient documentation

## 2022-08-02 DIAGNOSIS — I7121 Aneurysm of the ascending aorta, without rupture: Secondary | ICD-10-CM | POA: Insufficient documentation

## 2022-08-02 NOTE — Patient Instructions (Signed)
Medication Instructions:   Your physician recommends that you continue on your current medications as directed. Please refer to the Current Medication list given to you today.   *If you need a refill on your cardiac medications before your next appointment, please call your pharmacy*   Lab Work:  NONE ORDERED  TODAY    If you have labs (blood work) drawn today and your tests are completely normal, you will receive your results only by: MyChart Message (if you have MyChart) OR A paper copy in the mail If you have any lab test that is abnormal or we need to change your treatment, we will call you to review the results.   Testing/Procedures: NONE ORDERED  TODAY    Follow-Up: At [facility], you and your health needs are our priority.  As part of our continuing mission to provide you with exceptional heart care, we have created designated Provider Care Teams.  These Care Teams include your primary Cardiologist (physician) and Advanced Practice Providers (APPs -  Physician Assistants and Nurse Practitioners) who all work together to provide you with the care you need, when you need it.  We recommend signing up for the patient portal called "MyChart".  Sign up information is provided on this After Visit Summary.  MyChart is used to connect with patients for Virtual Visits (Telemedicine).  Patients are able to view lab/test results, encounter notes, upcoming appointments, etc.  Non-urgent messages can be sent to your provider as well.   To learn more about what you can do with MyChart, go to [email or web address].    Your next appointment:    6 month(s)  Provider:   Lance Muss, MD     Other Instructions  [email or web address]  Evolocumab Injection ( generic of Repatha )  What is this medication? EVOLOCUMAB (e voe LOK ue mab) treats high cholesterol. It may also be used to lower the risk of heart attack, stroke, and a type of heart surgery. It works by decreasing bad  cholesterol (such as LDL) in your blood. It is a monoclonal antibody. Changes to diet and exercise are often combined with this medication. This medicine may be used for other purposes; ask your health care provider or pharmacist if you have questions. COMMON BRAND NAME(S): Repatha, Repatha SureClick What should I tell my care team before I take this medication? They need to know if you have any of these conditions: An unusual or allergic reaction to evolocumab, latex, other medications, foods, dyes, or preservatives Pregnant or trying to get pregnant Breast-feeding How should I use this medication? This medication is injected under the skin. You will be taught how to prepare and give it. Take it as directed on the prescription label at the same time every day. Keep taking it unless your care team tells you to stop. It is important that you put your used needles and syringes in a special sharps container. Do not put them in a trash can. If you do not have a sharps container, call your pharmacist or care team to get one. This medication comes with INSTRUCTIONS FOR USE. Ask your pharmacist for directions on how to use this medication. Read the information carefully. Talk to your pharmacist or care team if you have questions. Talk to your care team about the use of this medication in children. While it may be prescribed for children as young as 10 years for selected conditions, precautions do apply. Overdosage: If you think you have taken too much of this medicine  contact a poison control center or emergency room at once. NOTE: This medicine is only for you. Do not share this medicine with others. What if I miss a dose? It is important not to miss any doses. Talk to your care team about what to do if you miss a dose. What may interact with this medication? Interactions are not expected. This list may not describe all possible interactions. Give your health care provider a list of all the medicines,  herbs, non-prescription drugs, or dietary supplements you use. Also tell them if you smoke, drink alcohol, or use illegal drugs. Some items may interact with your medicine. What should I watch for while using this medication? Visit your care team for regular checks on your progress. Tell your care team if your symptoms do not start to get better or if they get worse. You may need blood work while you are taking this medication. Do not wear the on-body infuser during an MRI. Taking this medication is only part of a total heart healthy program. Ask your care team if there are other changes you can make to improve your overall health. What side effects may I notice from receiving this medication? Side effects that you should report to your care team as soon as possible: Allergic reactions or angioedema--skin rash, itching or hives, swelling of the face, eyes, lips, tongue, arms, or legs, trouble swallowing or breathing Side effects that usually do not require medical attention (report to your care team if they continue or are bothersome): Back pain Flu-like symptoms--fever, chills, muscle pain, cough, headache, fatigue Pain, redness, or irritation at injection site Runny or stuffy nose Sore throat This list may not describe all possible side effects. Call your doctor for medical advice about side effects. You may report side effects to FDA at 1-800-FDA-1088. Where should I keep my medication? Keep out of the reach of children and pets. Store in a refrigerator or at room temperature between 20 and 25 degrees C (68 and 77 degrees F). Refrigeration (preferred): Store it in the refrigerator. Do not freeze. Keep it in the original carton until you are ready to take it. Remove the dose from the carton about 30 minutes before it is time for you to take it. Get rid of any unused medication after the expiration date. Room temperature: This medication may be stored at room temperature for up to 30 days. Keep  it in the original carton until you are ready to take it. If it is stored at room temperature, get rid of any unused medication after 30 days or after it expires, whichever is first. Protect from light. Do not shake. Avoid exposure to extreme heat. To get rid of medications that are no longer needed or have expired: Take the medication to a medication take-back program. Check with your pharmacy or law enforcement to find a location. If you cannot return the medication, ask your pharmacist or care team how to get rid of this medication safely. NOTE: This sheet is a summary. It may not cover all possible information. If you have questions about this medicine, talk to your doctor, pharmacist, or health care provider.  2024 Elsevier/Gold Standard (2021-03-16 00:00:00)

## 2022-09-02 DIAGNOSIS — Z86010 Personal history of colonic polyps: Secondary | ICD-10-CM | POA: Diagnosis not present

## 2022-09-02 DIAGNOSIS — Z7901 Long term (current) use of anticoagulants: Secondary | ICD-10-CM | POA: Diagnosis not present

## 2022-09-03 ENCOUNTER — Telehealth: Payer: Self-pay | Admitting: *Deleted

## 2022-09-03 NOTE — Telephone Encounter (Signed)
   Pre-operative Risk Assessment    Patient Name: Bryan Cowan  DOB: 1950-03-15 MRN: [id number]      Request for Surgical Clearance    Procedure:   Colonoscopy  Date of Surgery:  Clearance 11/05/22                                 Surgeon:  Dr.Schooler Surgeon's Group or Practice Name:  [facility] Phone number:  [phone] Fax number:  [phone]   Type of Clearance Requested:   - Medical  - Pharmacy:  Hold Rivaroxaban (Xarelto) Not Indicated.   Type of Anesthesia:   Propofol   Additional requests/questions:    Signed, Emmit Pomfret   09/03/2022, 4:06 PM

## 2022-09-04 NOTE — Telephone Encounter (Signed)
Patient with diagnosis of afib on Xarelto for anticoagulation.    Procedure: Colonoscopy  Date of procedure: 11/05/2022   CHA2DS2-VASc Score = 2   This indicates a 2.2% annual risk of stroke. The patient's score is based upon: CHF History: 0 HTN History: 0 Diabetes History: 0 Stroke History: 0 Vascular Disease History: 1 Age Score: 1 Gender Score: 0     CrCl 98 mL/min  Platelet count 205 K    Per office protocol, patient can hold Xarelto for 1-2 days prior to procedure.     **This guidance is not considered finalized until pre-operative APP has relayed final recommendations.**

## 2022-09-04 NOTE — Telephone Encounter (Signed)
     Primary Cardiologist: Lance Muss, MD  Chart reviewed as part of pre-operative protocol coverage. Given past medical history and time since last visit, based on ACC/AHA guidelines, Bryan Cowan would be at acceptable risk for the planned procedure without further cardiovascular testing.   Patient with diagnosis of afib on Xarelto for anticoagulation.     Procedure: Colonoscopy  Date of procedure: 11/05/2022     CHA2DS2-VASc Score = 2   This indicates a 2.2% annual risk of stroke. The patient's score is based upon: CHF History: 0 HTN History: 0 Diabetes History: 0 Stroke History: 0 Vascular Disease History: 1 Age Score: 1 Gender Score: 0       CrCl 98 mL/min  Platelet count 205 K       Per office protocol, patient can hold Xarelto for 1-2 days prior to procedure.    I will route this recommendation to the requesting party via Epic fax function and remove from pre-op pool.  Please call with questions.  Bryan Cowan. Bryan Murley NP-C     09/04/2022, 11:41 AM [facility] [address] Office [phone] Fax [phone]

## 2022-10-07 DIAGNOSIS — H401113 Primary open-angle glaucoma, right eye, severe stage: Secondary | ICD-10-CM | POA: Diagnosis not present

## 2022-10-07 DIAGNOSIS — H40022 Open angle with borderline findings, high risk, left eye: Secondary | ICD-10-CM | POA: Diagnosis not present

## 2022-10-07 DIAGNOSIS — H35372 Puckering of macula, left eye: Secondary | ICD-10-CM | POA: Diagnosis not present

## 2022-10-07 DIAGNOSIS — Z961 Presence of intraocular lens: Secondary | ICD-10-CM | POA: Diagnosis not present

## 2022-10-07 DIAGNOSIS — H33001 Unspecified retinal detachment with retinal break, right eye: Secondary | ICD-10-CM | POA: Diagnosis not present

## 2022-11-02 ENCOUNTER — Other Ambulatory Visit: Payer: Self-pay | Admitting: Interventional Cardiology

## 2022-11-02 DIAGNOSIS — I4891 Unspecified atrial fibrillation: Secondary | ICD-10-CM

## 2022-11-04 NOTE — Telephone Encounter (Signed)
Prescription refill request for Xarelto received.  Indication:afib Last office visit:6/24 Weight:88.9  kg Age:72 Scr:0.79  2/24 CrCl:104.72  ml/min  Prescription refilled

## 2022-11-05 DIAGNOSIS — K648 Other hemorrhoids: Secondary | ICD-10-CM | POA: Diagnosis not present

## 2022-11-05 DIAGNOSIS — Z09 Encounter for follow-up examination after completed treatment for conditions other than malignant neoplasm: Secondary | ICD-10-CM | POA: Diagnosis not present

## 2022-11-05 DIAGNOSIS — K5730 Diverticulosis of large intestine without perforation or abscess without bleeding: Secondary | ICD-10-CM | POA: Diagnosis not present

## 2022-11-05 DIAGNOSIS — D122 Benign neoplasm of ascending colon: Secondary | ICD-10-CM | POA: Diagnosis not present

## 2022-11-05 DIAGNOSIS — D120 Benign neoplasm of cecum: Secondary | ICD-10-CM | POA: Diagnosis not present

## 2022-11-05 DIAGNOSIS — Z86010 Personal history of colonic polyps: Secondary | ICD-10-CM | POA: Diagnosis not present

## 2022-11-05 DIAGNOSIS — D128 Benign neoplasm of rectum: Secondary | ICD-10-CM | POA: Diagnosis not present

## 2022-11-05 DIAGNOSIS — K6389 Other specified diseases of intestine: Secondary | ICD-10-CM | POA: Diagnosis not present

## 2022-11-07 DIAGNOSIS — D120 Benign neoplasm of cecum: Secondary | ICD-10-CM | POA: Diagnosis not present

## 2022-11-07 DIAGNOSIS — D128 Benign neoplasm of rectum: Secondary | ICD-10-CM | POA: Diagnosis not present

## 2022-11-07 DIAGNOSIS — K6389 Other specified diseases of intestine: Secondary | ICD-10-CM | POA: Diagnosis not present

## 2022-11-07 DIAGNOSIS — D122 Benign neoplasm of ascending colon: Secondary | ICD-10-CM | POA: Diagnosis not present

## 2023-01-20 DIAGNOSIS — Z961 Presence of intraocular lens: Secondary | ICD-10-CM | POA: Diagnosis not present

## 2023-01-20 DIAGNOSIS — H40022 Open angle with borderline findings, high risk, left eye: Secondary | ICD-10-CM | POA: Diagnosis not present

## 2023-01-20 DIAGNOSIS — H35372 Puckering of macula, left eye: Secondary | ICD-10-CM | POA: Diagnosis not present

## 2023-01-20 DIAGNOSIS — H401113 Primary open-angle glaucoma, right eye, severe stage: Secondary | ICD-10-CM | POA: Diagnosis not present

## 2023-01-20 DIAGNOSIS — H33001 Unspecified retinal detachment with retinal break, right eye: Secondary | ICD-10-CM | POA: Diagnosis not present

## 2023-02-02 NOTE — Progress Notes (Unsigned)
Cardiology Office Note:  .   Date:  02/03/2023  ID:  Bryan Cowan, DOB 1950/06/08, MRN [id number] PCP: Elder Negus, MD  [facility] Providers Cardiologist:  None { History of Present Illness: Bryan Cowan   Bryan Cowan is a 72 y.o. male with a past medical history of basal cell carcinoma, CAD, hyperlipidemia, prostate cancer presenting for follow-up appointment.  He was seen in the clinic 10/2021 and was originally evaluated for dyspnea on exertion back in November 2022.  Had a coronary CTA showing a trileaflet aortic valve and mild calcifications.  Coronary arteries were normal in origin.  Right dominance.  RCA was large and gave rise to the PDA and PLV B.  Mild (25 to 49%) calcified plaque in the proximal to mid RCA with minimal (less than 25%) calcification of plaque distally.  Left main was large and gave rise to LAD, RI, and LCx arteries.  LAD had scattered mild (25 to 29%) calcified plaque.  D1 was a tiny vessel with minimal (less than 25%).  RI was a small vessel with mild (25 to 49%) calcified plaque.  LCx was nondominant artery that gives rise to a tiny OM1 vessel and a mild (25 to 49%) calcified plaque.  Severe (greater than 70%) mixed plaque in the mid LCx.  CT FFR in the OM1 was significant.  He had an Apple Watch which showed atrial fibrillation.  Confirmed by Zio patch.  He was tolerating Xarelto.  No significant cardiac symptoms at the time of last follow-up appointment.  He was seen by me June 2024, he states that he has not had any issues with atrial fibrillation.  He has been compliant with his medications.  He is on Xarelto 20 mg daily as well as magnesium.  Heart rate is 63 today.  He goes to the gym every day and tries to eat a very heart healthy diet.  We discussed his LDL which is 166.  He has not had much luck with statins.  He does not want to be on any cholesterol medicine at this time.  We did discuss Repatha and Praluent as options as well as Zetia.  I highly  recommended he consider one of our injectable options so that we can get his LDL to goal.  Goal for him would be less than 55 for his LDL.  In order to achieve this time of reduction Repatha/Praluent would be the best option.  We have provided him with some data to review on his own today.  No chest pain, shortness of breath, dizziness, swelling, or palpitations.  Today, he presents with a history of high LDL cholesterol and intolerance to statins, presents for a routine follow-up. He reports no current symptoms of chest pain or shortness of breath and maintains an active lifestyle, going to the gym daily. The patient's LDL cholesterol has been persistently high, and previous attempts to manage it with statins resulted in intolerable side effects within a week to ten days of initiation. The patient has considered non-statin options, including Repatha and Praluent, but has not yet decided to start these medications. The patient also has a history of atrial fibrillation, for which he is on Xarelto, and an aortic aneurysm that is being monitored.  Reports no shortness of breath nor dyspnea on exertion. Reports no chest pain, pressure, or tightness. No edema, orthopnea, PND. Reports no palpitations.   Discussed the use of AI scribe software for clinical note transcription with the patient, who gave verbal consent to  proceed.  ROS: Please refer to HPI for pertinent ROS.  Studies Reviewed: Bryan Cowan    EKG: Normal sinus rhythm, rate 63 bpm  CT angio of the chest  IMPRESSION: Dilatation of the ascending aorta to 4.1 cm. This is stable from the prior exam. Recommend annual imaging followup by CTA or MRA. This recommendation follows 2010 ACCF/AHA/AATS/ACR/ASA/SCA/SCAI/SIR/STS/SVM Guidelines for the Diagnosis and Management of Patients with Thoracic Aortic Disease. Circulation. 2010; 121: M578-I696. Aortic aneurysm NOS (ICD10-I71.9)   Resolution of previously seen ground-glass nodules. No new nodularity is  noted.   Changes consistent with prior granulomatous disease.   Aortic Atherosclerosis (ICD10-I70.0). Risk Assessment/Calculations:    CHA2DS2-VASc Score = 2   This indicates a 2.2% annual risk of stroke. The patient's score is based upon: CHF History: 0 HTN History: 0 Diabetes History: 0 Stroke History: 0 Vascular Disease History: 1 Age Score: 1 Gender Score: 0       Physical Exam:   VS:  BP 128/72   Pulse 84   Resp (!) 95   Ht 6\' 1"  (1.854 m)   Wt 201 lb (91.2 kg)   BMI 26.52 kg/m    Wt Readings from Last 3 Encounters:  02/03/23 201 lb (91.2 kg)  08/02/22 196 lb (88.9 kg)  07/02/22 195 lb (88.5 kg)    GEN: Well nourished, well developed in no acute distress NECK: No JVD; No carotid bruits CARDIAC: RRR, no murmurs, rubs, gallops RESPIRATORY:  Clear to auscultation without rales, wheezing or rhonchi  ABDOMEN: Soft, non-tender, non-distended EXTREMITIES:  No edema; No deformity   ASSESSMENT AND PLAN: .   CAD -Continue current medications which include Xarelto 20 mg daily (not on aspirin), magnesium supplementation, consider adding Repatha/Praluent for better LDL reduction -Continue daily activity -Continue heart healthy, low-sodium diet  Hyperlipidemia Elevated LDL, intolerance to statins. Discussed options for non-statin lipid-lowering medications including Repatha, Praluent, and Bryan Cowan. Patient to consider options. -Provide patient with educational materials on Bryan Cowan, Bryan Cowan, and Bryan Cowan. -Check lipid panel in February 2025 with primary care provider.  Atrial Fibrillation No recent issues reported. On Xarelto for anticoagulation. -Continue current management.  Thoracic Aortic Aneurysm Monitoring with CTA, last performed in June 2024. -Plan for follow-up CTA in June 2025.  General Health Maintenance -Continue regular exercise regimen. -Follow-up in 6 months.   Dr. Eldridge Dace or me in 6 months.  Dispo: Please follow-up with Dr. Rosemary Holms in 6  months  Signed, Sharlene Dory, PA-C

## 2023-02-03 ENCOUNTER — Encounter: Payer: Self-pay | Admitting: Physician Assistant

## 2023-02-03 ENCOUNTER — Ambulatory Visit: Payer: Medicare Other | Admitting: Interventional Cardiology

## 2023-02-03 ENCOUNTER — Ambulatory Visit: Payer: Medicare Other | Attending: Interventional Cardiology | Admitting: Physician Assistant

## 2023-02-03 VITALS — BP 128/72 | HR 84 | Resp 95 | Ht 73.0 in | Wt 201.0 lb

## 2023-02-03 DIAGNOSIS — I77819 Aortic ectasia, unspecified site: Secondary | ICD-10-CM | POA: Diagnosis not present

## 2023-02-03 DIAGNOSIS — E785 Hyperlipidemia, unspecified: Secondary | ICD-10-CM

## 2023-02-03 DIAGNOSIS — I480 Paroxysmal atrial fibrillation: Secondary | ICD-10-CM | POA: Diagnosis not present

## 2023-02-03 DIAGNOSIS — I2510 Atherosclerotic heart disease of native coronary artery without angina pectoris: Secondary | ICD-10-CM

## 2023-02-03 DIAGNOSIS — I10 Essential (primary) hypertension: Secondary | ICD-10-CM | POA: Diagnosis not present

## 2023-02-03 NOTE — Patient Instructions (Signed)
Medication Instructions:  Your physician recommends that you continue on your current medications as directed. Please refer to the Current Medication list given to you today.  *If you need a refill on your cardiac medications before your next appointment, please call your pharmacy*   Lab Work: Please have your primary care doctor check a lipid panel when you see them in February   If you have labs (blood work) drawn today and your tests are completely normal, you will receive your results only by: MyChart Message (if you have MyChart) OR A paper copy in the mail If you have any lab test that is abnormal or we need to change your treatment, we will call you to review the results.   Testing/Procedures: None ordered    Follow-Up: At [facility], you and your health needs are our priority.  As part of our continuing mission to provide you with exceptional heart care, we have created designated Provider Care Teams.  These Care Teams include your primary Cardiologist (physician) and Advanced Practice Providers (APPs -  Physician Assistants and Nurse Practitioners) who all work together to provide you with the care you need, when you need it.  We recommend signing up for the patient portal called "MyChart".  Sign up information is provided on this After Visit Summary.  MyChart is used to connect with patients for Virtual Visits (Telemedicine).  Patients are able to view lab/test results, encounter notes, upcoming appointments, etc.  Non-urgent messages can be sent to your provider as well.   To learn more about what you can do with MyChart, go to [email or web address].    Your next appointment:   6 month(s)  Provider:   Dr. Truett Mainland  Other Instructions Evolocumab Injection What is this medication? EVOLOCUMAB (e voe LOK ue mab) treats high cholesterol. It may also be used to lower the risk of heart attack, stroke, and a type of heart surgery. It works by decreasing bad  cholesterol (such as LDL) in your blood. It is a monoclonal antibody. Changes to diet and exercise are often combined with this medication. This medicine may be used for other purposes; ask your health care provider or pharmacist if you have questions. COMMON BRAND NAME(S): Repatha, Repatha SureClick What should I tell my care team before I take this medication? They need to know if you have any of these conditions: An unusual or allergic reaction to evolocumab, latex, other medications, foods, dyes, or preservatives Pregnant or trying to get pregnant Breast-feeding How should I use this medication? This medication is injected under the skin. You will be taught how to prepare and give it. Take it as directed on the prescription label at the same time every day. Keep taking it unless your care team tells you to stop. It is important that you put your used needles and syringes in a special sharps container. Do not put them in a trash can. If you do not have a sharps container, call your pharmacist or care team to get one. This medication comes with INSTRUCTIONS FOR USE. Ask your pharmacist for directions on how to use this medication. Read the information carefully. Talk to your pharmacist or care team if you have questions. Talk to your care team about the use of this medication in children. While it may be prescribed for children as young as 10 years for selected conditions, precautions do apply. Overdosage: If you think you have taken too much of this medicine contact a poison control center or emergency  room at once. NOTE: This medicine is only for you. Do not share this medicine with others. What if I miss a dose? It is important not to miss any doses. Talk to your care team about what to do if you miss a dose. What may interact with this medication? Interactions are not expected. This list may not describe all possible interactions. Give your health care provider a list of all the medicines,  herbs, non-prescription drugs, or dietary supplements you use. Also tell them if you smoke, drink alcohol, or use illegal drugs. Some items may interact with your medicine. What should I watch for while using this medication? Visit your care team for regular checks on your progress. Tell your care team if your symptoms do not start to get better or if they get worse. You may need blood work while you are taking this medication. Do not wear the on-body infuser during an MRI. Taking this medication is only part of a total heart healthy program. Ask your care team if there are other changes you can make to improve your overall health. What side effects may I notice from receiving this medication? Side effects that you should report to your care team as soon as possible: Allergic reactions or angioedema--skin rash, itching or hives, swelling of the face, eyes, lips, tongue, arms, or legs, trouble swallowing or breathing Side effects that usually do not require medical attention (report to your care team if they continue or are bothersome): Back pain Flu-like symptoms--fever, chills, muscle pain, cough, headache, fatigue Pain, redness, or irritation at injection site Runny or stuffy nose Sore throat This list may not describe all possible side effects. Call your doctor for medical advice about side effects. You may report side effects to FDA at 1-800-FDA-1088. Where should I keep my medication? Keep out of the reach of children and pets. Store in a refrigerator or at room temperature between 20 and 25 degrees C (68 and 77 degrees F). Refrigeration (preferred): Store it in the refrigerator. Do not freeze. Keep it in the original carton until you are ready to take it. Remove the dose from the carton about 30 minutes before it is time for you to take it. Get rid of any unused medication after the expiration date. Room temperature: This medication may be stored at room temperature for up to 30 days. Keep  it in the original carton until you are ready to take it. If it is stored at room temperature, get rid of any unused medication after 30 days or after it expires, whichever is first. Protect from light. Do not shake. Avoid exposure to extreme heat. To get rid of medications that are no longer needed or have expired: Take the medication to a medication take-back program. Check with your pharmacy or law enforcement to find a location. If you cannot return the medication, ask your pharmacist or care team how to get rid of this medication safely. NOTE: This sheet is a summary. It may not cover all possible information. If you have questions about this medicine, talk to your doctor, pharmacist, or health care provider.  2024 Elsevier/Gold Standard (2021-03-16 00:00:00)  Inclisiran Injection What is this medication? INCLISIRAN (in kli SIR an) treats high cholesterol. It works by decreasing bad cholesterol (such as LDL) in your blood. Changes to diet and exercise are often combined with this medication. This medicine may be used for other purposes; ask your health care provider or pharmacist if you have questions. COMMON BRAND NAME(S): LEQVIO  What should I tell my care team before I take this medication? They need to know if you have any of these conditions: An unusual or allergic reaction to inclisiran, other medications, foods, dyes, or preservatives Pregnant or trying to get pregnant Breast-feeding How should I use this medication? This medication is injected under the skin. It is given by your care team in a hospital or clinic setting. Talk to your care team about the use of this medication in children. Special care may be needed. Overdosage: If you think you have taken too much of this medicine contact a poison control center or emergency room at once. NOTE: This medicine is only for you. Do not share this medicine with others. What if I miss a dose? Keep appointments for follow-up doses. It  is important not to miss your dose. Call your care team if you are unable to keep an appointment. What may interact with this medication? Interactions are not expected. This list may not describe all possible interactions. Give your health care provider a list of all the medicines, herbs, non-prescription drugs, or dietary supplements you use. Also tell them if you smoke, drink alcohol, or use illegal drugs. Some items may interact with your medicine. What should I watch for while using this medication? Visit your care team for regular checks on your progress. Tell your care team if your symptoms do not start to get better or if they get worse. You may need blood work while you are taking this medication. What side effects may I notice from receiving this medication? Side effects that you should report to your care team as soon as possible: Allergic reactions--skin rash, itching, hives, swelling of the face, lips, tongue, or throat Side effects that usually do not require medical attention (report these to your care team if they continue or are bothersome): Joint pain Pain, redness, or irritation at injection site This list may not describe all possible side effects. Call your doctor for medical advice about side effects. You may report side effects to FDA at 1-800-FDA-1088. Where should I keep my medication? This medication is given in a hospital or clinic. It will not be stored at home. NOTE: This sheet is a summary. It may not cover all possible information. If you have questions about this medicine, talk to your doctor, pharmacist, or health care provider.  2024 Elsevier/Gold Standard (2021-08-31 00:00:00)

## 2023-05-03 ENCOUNTER — Other Ambulatory Visit: Payer: Self-pay | Admitting: Internal Medicine

## 2023-05-03 DIAGNOSIS — I4891 Unspecified atrial fibrillation: Secondary | ICD-10-CM

## 2023-05-05 NOTE — Telephone Encounter (Signed)
 Prescription refill request for Xarelto received.  Indication:afib Last office visit:12/24 Weight:91.2  kg Age:73 Scr:0.79  2024 CrCl:105.82  ml/min  Prescription refilled

## 2023-05-06 ENCOUNTER — Telehealth: Payer: Self-pay | Admitting: Physician Assistant

## 2023-05-06 NOTE — Telephone Encounter (Signed)
 Spoke with patient is aware that there is no genreric xarelto right now. Did advise to call insurance to see if he can do payment plan to make the cost more affordable

## 2023-05-06 NOTE — Telephone Encounter (Signed)
 Pt c/o medication issue:  1. Name of Medication:   XARELTO 20 MG TABS tablet    2. How are you currently taking this medication (dosage and times per day)? As written  3. Are you having a reaction (difficulty breathing--STAT)? No   4. What is your medication issue? Cost has gone up and wants to switch to generic for xarelto

## 2023-05-16 DIAGNOSIS — L570 Actinic keratosis: Secondary | ICD-10-CM | POA: Diagnosis not present

## 2023-05-16 DIAGNOSIS — L821 Other seborrheic keratosis: Secondary | ICD-10-CM | POA: Diagnosis not present

## 2023-05-16 DIAGNOSIS — X32XXXD Exposure to sunlight, subsequent encounter: Secondary | ICD-10-CM | POA: Diagnosis not present

## 2023-05-16 DIAGNOSIS — D225 Melanocytic nevi of trunk: Secondary | ICD-10-CM | POA: Diagnosis not present

## 2023-05-26 DIAGNOSIS — Z961 Presence of intraocular lens: Secondary | ICD-10-CM | POA: Diagnosis not present

## 2023-05-26 DIAGNOSIS — H40022 Open angle with borderline findings, high risk, left eye: Secondary | ICD-10-CM | POA: Diagnosis not present

## 2023-05-26 DIAGNOSIS — H401113 Primary open-angle glaucoma, right eye, severe stage: Secondary | ICD-10-CM | POA: Diagnosis not present

## 2023-05-26 DIAGNOSIS — H35372 Puckering of macula, left eye: Secondary | ICD-10-CM | POA: Diagnosis not present

## 2023-05-26 DIAGNOSIS — H33001 Unspecified retinal detachment with retinal break, right eye: Secondary | ICD-10-CM | POA: Diagnosis not present

## 2023-06-09 ENCOUNTER — Encounter: Payer: Self-pay | Admitting: Family Medicine

## 2023-06-09 ENCOUNTER — Ambulatory Visit (INDEPENDENT_AMBULATORY_CARE_PROVIDER_SITE_OTHER): Admitting: Family Medicine

## 2023-06-09 VITALS — BP 122/80 | HR 78 | Temp 98.3°F | Ht 73.0 in | Wt 199.0 lb

## 2023-06-09 DIAGNOSIS — J449 Chronic obstructive pulmonary disease, unspecified: Secondary | ICD-10-CM

## 2023-06-09 DIAGNOSIS — I2510 Atherosclerotic heart disease of native coronary artery without angina pectoris: Secondary | ICD-10-CM

## 2023-06-09 DIAGNOSIS — Z79899 Other long term (current) drug therapy: Secondary | ICD-10-CM | POA: Insufficient documentation

## 2023-06-09 DIAGNOSIS — Z0000 Encounter for general adult medical examination without abnormal findings: Secondary | ICD-10-CM | POA: Diagnosis not present

## 2023-06-09 DIAGNOSIS — Z8546 Personal history of malignant neoplasm of prostate: Secondary | ICD-10-CM | POA: Diagnosis not present

## 2023-06-09 DIAGNOSIS — I480 Paroxysmal atrial fibrillation: Secondary | ICD-10-CM | POA: Diagnosis not present

## 2023-06-09 DIAGNOSIS — R7303 Prediabetes: Secondary | ICD-10-CM | POA: Diagnosis not present

## 2023-06-09 DIAGNOSIS — E782 Mixed hyperlipidemia: Secondary | ICD-10-CM | POA: Diagnosis not present

## 2023-06-09 LAB — LIPID PANEL
Cholesterol: 232 mg/dL — ABNORMAL HIGH (ref 0–200)
HDL: 54.4 mg/dL (ref >39.00)
LDL Cholesterol: 165 mg/dL — ABNORMAL HIGH (ref 0–99)
NonHDL: 177.83
Total CHOL/HDL Ratio: 4
Triglycerides: 64 mg/dL (ref 0.0–149.0)
VLDL: 12.8 mg/dL (ref 0.0–40.0)

## 2023-06-09 LAB — BASIC METABOLIC PANEL WITH GFR
BUN: 12 mg/dL (ref 6–23)
CO2: 26 meq/L (ref 19–32)
Calcium: 9.1 mg/dL (ref 8.4–10.5)
Chloride: 102 meq/L (ref 96–112)
Creatinine, Ser: 0.77 mg/dL (ref 0.40–1.50)
GFR: 88.16 mL/min (ref >60.00)
Glucose, Bld: 97 mg/dL (ref 70–99)
Potassium: 3.8 meq/L (ref 3.5–5.1)
Sodium: 136 meq/L (ref 135–145)

## 2023-06-09 LAB — PSA: PSA: 0.08 ng/mL — ABNORMAL LOW (ref 0.10–4.00)

## 2023-06-09 LAB — HEMOGLOBIN A1C: Hgb A1c MFr Bld: 5.8 % (ref 4.6–6.5)

## 2023-06-09 NOTE — Assessment & Plan Note (Signed)
 I will reassess lipids today. Is considering Repatha use. Continue to follow with cardiology.

## 2023-06-09 NOTE — Assessment & Plan Note (Signed)
 I will check A1c and glucose today.

## 2023-06-09 NOTE — Assessment & Plan Note (Signed)
 No current symptoms of recurrence. I will check a PSA for surveillance.

## 2023-06-09 NOTE — Assessment & Plan Note (Signed)
 Overall health is excellent. Recommend ongoing regular exercise. Discussed recommended screenings and immunizations.

## 2023-06-09 NOTE — Assessment & Plan Note (Signed)
No current palpitations. Continue Xarelto 20 mg daily.

## 2023-06-09 NOTE — Assessment & Plan Note (Signed)
Improved with use of ICS/LABA.

## 2023-06-09 NOTE — Assessment & Plan Note (Signed)
 Stable. No chest pain. He should continued focus on optimal medical management.

## 2023-06-09 NOTE — Progress Notes (Signed)
 [facility] [address] [place] [address] Dept: [phone] Dept Fax: [phone]  Annual Physical Visit  Subjective:    Patient ID: Bryan Cowan, male    DOB: Feb 28, 1950, 73 y.o..   MRN: [id number]  Chief Complaint  Patient presents with   Annual Exam    CPE/labs.  No concerns.  Fasting today.      History of Present Illness:  Patient is in today for an annual physical/preventative visit.   Mr. Keaney has a history of CAD and hyperlipidemia. He is primarily managing this with regular exercise. He has not been tolerant of statins. There has been discussion of possible Repatha use.  Mr. Kings has a history of  paroxysmal atrial fibrillation. He is managed on rivaroxaban  20 mg daily for stroke prevention.   Mr. Franze has a history a chronic cough due to COPD. He is currently managed on Advair  HFA.   Mr. Gehlhausen has a prior history of prostate cancer.  Review of Systems  Constitutional:  Negative for chills, diaphoresis, fever, malaise/fatigue and weight loss.  HENT:  Positive for hearing loss. Negative for congestion, ear pain, sinus pain, sore throat and tinnitus.        Uses hearing aids. Still has difficulty at times in noisy environments.  Eyes:  Negative for blurred vision, pain, discharge and redness.       History of vision loss in right eye, due to prior detached retina.  Respiratory:  Negative for cough, shortness of breath and wheezing.   Cardiovascular:  Negative for chest pain and palpitations.  Gastrointestinal:  Negative for abdominal pain, constipation, diarrhea, heartburn, nausea and vomiting.  Musculoskeletal:  Negative for back pain, joint pain and myalgias.  Skin:  Negative for itching and rash.  Psychiatric/Behavioral:  Negative for depression. The patient is not nervous/anxious.    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   Left retinal lattice degeneration 09/13/2019   Primary open angle glaucoma of right eye, severe stage 09/13/2019   Optic disc atrophy, right 09/13/2019   Macular pucker, left eye 09/13/2019   History of retinal detachment 01/13/2018   Vision loss, right eye 01/13/2018   Hyperlipidemia    History of prostate cancer 02/29/2016   Bilateral tinnitus 02/29/2016   Bilateral hearing loss 02/29/2016   Past Surgical History:  Procedure Laterality Date   BASAL CELL CARCINOMA EXCISION  2019   COLONOSCOPY  2013   EYE SURGERY  2016   retinal detachment   HERNIA REPAIR  2021 and 2022   INGUINAL HERNIA REPAIR Left 11/04/2019   Procedure: LEFT INGUINAL HERNIA REPAIR WITH MESH;  Surgeon: Sim Dryer, MD;  Location: [facility];  Service: General;  Laterality: Left;   INGUINAL HERNIA REPAIR Right 02/07/2021   Procedure: RIGHT INGUINAL HERNIA REPAIR WITH MESH;  Surgeon: Sim Dryer, MD;  Location: [facility];  Service: General;  Laterality: Right;   INSERTION OF MESH Left 11/04/2019   Procedure: INSERTION OF MESH;  Surgeon: Sim Dryer, MD;  Location: [facility];  Service: General;  Laterality: Left;  INSERTION OF MESH Right 02/07/2021   Procedure: INSERTION OF MESH;  Surgeon: Sim Dryer, MD;  Location: [facility];  Service: General;  Laterality: Right;   JOINT REPLACEMENT  2009   bilateral knee replacement   PROSTATECTOMY  2004   RETINAL DETACHMENT SURGERY Right 2015   RETINAL LASER PROCEDURE  2017   SIGMOIDOSCOPY  2000   TOTAL KNEE ARTHROPLASTY  Bilateral 2009   Family History  Problem Relation Age of Onset   COPD Mother    Cancer Father        Lung   Cancer Daughter        Melanoma   Cancer Paternal Uncle        Liver   Cancer Daughter    Colon cancer Neg Hx    Esophageal cancer Neg Hx    Prostate cancer Neg Hx    Rectal cancer Neg Hx    Outpatient Medications Prior to Visit  Medication Sig Dispense Refill   Ascorbic Acid (VITAMIN C) 1000 MG tablet Take 1,000 mg by mouth daily.     b complex vitamins tablet Take 1 tablet by mouth daily.     brimonidine (ALPHAGAN) 0.2 % ophthalmic solution Place 1 drop into the right eye 2 (two) times daily.     cholecalciferol (VITAMIN D3) 25 MCG (1000 UNIT) tablet Take 1,000 Units by mouth daily.     Coenzyme Q10 100 MG capsule Take 100 mg by mouth.      fluticasone -salmeterol (ADVAIR  HFA) 45-21 MCG/ACT inhaler INHALE 2 PUFFS INTO THE LUNGS TWICE A DAY 12 g 6   latanoprost (XALATAN) 0.005 % ophthalmic solution Place 1 drop into both eyes at bedtime.     magnesium 30 MG tablet Take 30 mg by mouth daily.     MILK THISTLE PO Take by mouth daily.     nitroGLYCERIN  (NITROSTAT ) 0.4 MG SL tablet Place 1 tablet (0.4 mg total) under the tongue every 5 (five) minutes as needed for chest pain. 25 tablet 6   Omega-3 Fatty Acids (FISH OIL) 1000 MG CAPS Take by mouth daily.     timolol (TIMOPTIC) 0.5 % ophthalmic solution timolol maleate 0.5 % eye drops  PLACE 1 DROP INTO BOTH EYES TWICE DAILY     XARELTO  20 MG TABS tablet TAKE 1 TABLET BY MOUTH DAILY WITH SUPPER. 90 tablet 3   mometasone  (ELOCON ) 0.1 % cream Apply 1 Application topically daily. 45 g 0   No facility-administered medications prior to visit.   No Known Allergies Objective:   Today's Vitals   06/09/23 0921  BP: 122/80  Pulse: 78  Temp: 98.3 F (36.8 C)  TempSrc: Temporal  SpO2: 95%  Weight: 199 lb (90.3 kg)  Height: 6\' 1"  (1.854 m)   Body mass index is 26.25 kg/m.   General: Well developed, well nourished. No acute  distress. HEENT: Normocephalic, non-traumatic. PERRL, EOMI. Conjunctiva clear. External ears normal. EAC and   TMs normal bilaterally. Nose clear without congestion or rhinorrhea. Mucous membranes moist.   Oropharynx clear. Good dentition. Neck: Supple. No lymphadenopathy. No thyromegaly. Lungs: Clear to auscultation bilaterally. No wheezing, rales or rhonchi. CV: RRR without murmurs or rubs. Pulses 2+ bilaterally. Abdomen: Soft, non-tender. Bowel sounds positive, normal pitch and frequency. No hepatosplenomegaly.   No rebound or guarding. Extremities: Full ROM. No joint swelling or tenderness. No edema noted. Skin: Warm and dry. No rashes. Psych: Alert and oriented. Normal mood and affect.  Health Maintenance Due  Topic Date Due  Medicare Annual Wellness (AWV)  04/13/2023     Assessment & Plan:   Problem List Items Addressed This Visit       Cardiovascular and Mediastinum   Atrial fibrillation (HCC)   No current palpitations. Continue Xarelto  20 mg daily.      Coronary artery disease   Stable. No chest pain. He should continued focus on optimal medical management.      Relevant Orders   Basic metabolic panel with GFR     Respiratory   COPD (chronic obstructive pulmonary disease) (HCC)   Improved with use of ICS/LABA.        Other   Annual physical exam - Primary   Overall health is excellent. Recommend ongoing regular exercise. Discussed recommended screenings and immunizations.       History of prostate cancer   No current symptoms of recurrence. I will check a PSA for surveillance.      Relevant Orders   PSA   Hyperlipidemia   I will reassess lipids today. Is considering Repatha use. Continue to follow with cardiology.      Relevant Orders   Lipid panel   Prediabetes   I will check A1c and glucose today.      Relevant Orders   Hemoglobin A1c   Basic metabolic panel with GFR    Return in about 1 year (around 06/08/2024).   Graig Lawyer, MD

## 2023-06-25 ENCOUNTER — Ambulatory Visit (HOSPITAL_COMMUNITY)
Admission: RE | Admit: 2023-06-25 | Discharge: 2023-06-25 | Disposition: A | Discharge status: Home / Self Care | Source: Ambulatory Visit | Attending: Interventional Cardiology | Admitting: Interventional Cardiology

## 2023-06-25 DIAGNOSIS — I7121 Aneurysm of the ascending aorta, without rupture: Secondary | ICD-10-CM | POA: Insufficient documentation

## 2023-06-25 DIAGNOSIS — I700 Atherosclerosis of aorta: Secondary | ICD-10-CM | POA: Diagnosis not present

## 2023-06-25 MED ORDER — IOHEXOL 350 MG/ML SOLN
100.0000 mL | Freq: Once | INTRAVENOUS | Status: AC | PRN
  Administered 2023-06-25: 100 mL via INTRAVENOUS

## 2023-06-30 ENCOUNTER — Encounter: Payer: Self-pay | Admitting: *Deleted

## 2023-07-02 ENCOUNTER — Other Ambulatory Visit: Payer: Self-pay | Admitting: *Deleted

## 2023-07-02 ENCOUNTER — Ambulatory Visit: Payer: Self-pay | Admitting: *Deleted

## 2023-07-02 DIAGNOSIS — I77819 Aortic ectasia, unspecified site: Secondary | ICD-10-CM

## 2023-08-04 ENCOUNTER — Ambulatory Visit: Payer: Medicare Other | Admitting: Cardiology

## 2023-08-16 ENCOUNTER — Other Ambulatory Visit: Payer: Self-pay | Admitting: Pulmonary Disease

## 2023-08-16 DIAGNOSIS — J449 Chronic obstructive pulmonary disease, unspecified: Secondary | ICD-10-CM

## 2023-08-25 ENCOUNTER — Ambulatory Visit

## 2023-08-25 ENCOUNTER — Ambulatory Visit: Payer: Self-pay | Attending: Cardiology | Admitting: Cardiology

## 2023-08-25 ENCOUNTER — Encounter: Payer: Self-pay | Admitting: Cardiology

## 2023-08-25 VITALS — BP 120/68 | HR 81 | Ht 73.0 in | Wt 201.0 lb

## 2023-08-25 DIAGNOSIS — I493 Ventricular premature depolarization: Secondary | ICD-10-CM

## 2023-08-25 DIAGNOSIS — I10 Essential (primary) hypertension: Secondary | ICD-10-CM | POA: Insufficient documentation

## 2023-08-25 DIAGNOSIS — E782 Mixed hyperlipidemia: Secondary | ICD-10-CM

## 2023-08-25 DIAGNOSIS — Z79899 Other long term (current) drug therapy: Secondary | ICD-10-CM

## 2023-08-25 DIAGNOSIS — I2510 Atherosclerotic heart disease of native coronary artery without angina pectoris: Secondary | ICD-10-CM | POA: Diagnosis not present

## 2023-08-25 MED ORDER — ATORVASTATIN CALCIUM 20 MG PO TABS: 20.0000 mg | ORAL_TABLET | Freq: Every day | ORAL | 3 refills | Status: DC

## 2023-08-25 NOTE — Patient Instructions (Addendum)
 Medication Instructions:  START Lipitor 20 mg daily *If you need a refill on your cardiac medications before your next appointment, please call your pharmacy*  Lab Work: Lipid Panel in 3 months  If you have labs (blood work) drawn today and your tests are completely normal, you will receive your results only by: MyChart Message (if you have MyChart) OR A paper copy in the mail If you have any lab test that is abnormal or we need to change your treatment, we will call you to review the results.  Testing/Procedures: 30 day Event monitor  Your physician has recommended that you wear an event monitor. Event monitors are medical devices that record the heart's electrical activity. Doctors most often us  these monitors to diagnose arrhythmias. Arrhythmias are problems with the speed or rhythm of the heartbeat. The monitor is a small, portable device. You can wear one while you do your normal daily activities. This is usually used to diagnose what is causing palpitations/syncope (passing out).   Follow-Up: At [facility], you and your health needs are our priority.  As part of our continuing mission to provide you with exceptional heart care, our providers are all part of one team.  This team includes your primary Cardiologist (physician) and Advanced Practice Providers or APPs (Physician Assistants and Nurse Practitioners) who all work together to provide you with the care you need, when you need it.  Your next appointment:   Wednesday, October 29th 8:00 AM  Provider:   Newman JINNY Lawrence, MD

## 2023-08-25 NOTE — Progress Notes (Addendum)
 Cardiology Office Note:  .   Date:  08/25/2023  ID:  Oliva Lunger, DOB [birth year], MRN [id number] PCP: Thedora Garnette HERO, MD   HeartCare Providers Cardiologist:  Newman Lawrence, MD PCP: Thedora Garnette HERO, MD  Chief Complaint  Patient presents with   Atrial Fibrillation   Coronary Artery Disease   TAA     Bryan Cowan is a 73 y.o. male with hyperlipidemia, CAD, TAA  Discussed the use of AI scribe software for clinical note transcription with the patient, who gave verbal consent to proceed.  History of Present Illness  Patient is here today with his wife.  This is our first visit with him.  He stays active with regular exercise without any complains of chest pain or shortness of breath.  I reviewed his chart at length.  In 2023, he was placed on monitor and was started on Xarelto  with suspicion of A-fib.  However, subsequent final report of monitor did not show any A-fib.  However, somehow he remained on Xarelto .  He has not tolerated Crestor  due to side effect of feeling like a zombie.  He is on Lipitor.  LDL remains elevated.  On CTA and echocardiogram few years apart, he has stable 4.2 cm ascending aorta.  Heart rate and blood pressure well-controlled.  Coronary CT angiogram few years ago showed moderate CAD, with likely severe stenosis in a small caliber OM1, which was medically managed.   Vitals:   08/25/23 1025  BP: 120/68  Pulse: 81  SpO2: 94%      Review of Systems  Cardiovascular:  Negative for chest pain, dyspnea on exertion, leg swelling, palpitations and syncope.        Studies Reviewed: SABRA        EKG 08/25/2023: Sinus rhythm with 1st degree A-V block with occasional and consecutive Premature ventricular complexes No previous ECGs available    Labs 05/2023: Chol 232, TG 64, HDL 54, LDL 165 Cr 0.77  03/2022: Hb 13.5  CTA chest 06/2023: 1. Stable aneurysmal degeneration of the ascending thoracic aorta measuring up to 4.2 cm. No dissection or  high-grade focal stenosis. Continued attention on follow-up is advised.  2.  No acute intrathoracic processes identified.  Zio monitor 2023: Predominant rhythm is SR  45 to 137 bpm   Average HR 77 bpm Frequent PVCs 8.2% total, Occasional VT, longest 10 beats (at 105 bpm), fasted 4 beats (at 144-184 bpm) Occasoinal PACs. SVT.  Longest episode of SVT was 39 beats (21 sec) at 110 bpm.   Fast episode SVT was 8 beats at 164 bpm No triggered events.      Echocardiogram 12/2020:  1. Left ventricular ejection fraction, by estimation, is 50 to 55%. The  left ventricle has low normal function. The left ventricle has no regional  wall motion abnormalities. There is moderate asymmetric left ventricular  hypertrophy of the basal-septal  segment. Left ventricular diastolic parameters were normal.   2. Right ventricular systolic function is normal. The right ventricular  size is normal. Tricuspid regurgitation signal is inadequate for assessing  PA pressure.   3. Left atrial size was mildly dilated.   4. The mitral valve is normal in structure. Trivial mitral valve  regurgitation. No evidence of mitral stenosis.   5. The aortic valve is tricuspid. Aortic valve regurgitation is mild. No  aortic stenosis is present.   6. Aortic dilatation noted. Aneurysm of the aortic root, measuring 47 mm.  There is dilatation of the ascending aorta, measuring 43 mm.  CCTA 2022: 1. Coronary calcium  score of 811. This was 79th percentile for age-, race-, and sex-matched controls. (0 in LM). 2. Normal coronary origin with right dominance. 3. There is severe (>70%) mixed plaque in the mid LCX. There is mild (25-49%) plaque in the LAD, RCA, and RI arteries. CAD-RADS 4.  CT FFR: 1. FFRct findings are consistent with obstructive plaque in OM1 and otherwise diffuse, non-obstructive disease. 2. OM1 is a relatively small vessel around 1.9 mm. Consider aggressive medical management vs. Cardiac catheterization  if clinically warranted.   Physical Exam Vitals and nursing note reviewed.  Constitutional:      General: He is not in acute distress. Neck:     Vascular: No JVD.  Cardiovascular:     Rate and Rhythm: Normal rate and regular rhythm.     Heart sounds: Normal heart sounds. No murmur heard. Pulmonary:     Effort: Pulmonary effort is normal.     Breath sounds: Normal breath sounds. No wheezing or rales.  Musculoskeletal:     Right lower leg: No edema.     Left lower leg: No edema.      VISIT DIAGNOSES:   ICD-10-CM   1. Essential hypertension  I10 EKG 12-Lead    2. Medication management  Z79.899 EKG 12-Lead    3. Coronary artery disease involving native coronary artery of native heart without angina pectoris  I25.10 EKG 12-Lead    4. PVCs (premature ventricular contractions)  I49.3 Cardiac event monitor    5. Mixed hyperlipidemia  E78.2 Lipid panel    Lipid panel       Noal Abshier is a 73 y.o. male with hyperlipidemia, CAD, TAA Assessment & Plan  PVC: Reviewed EKG from today, and monitor results and tracings from 2023). While he does have frequent PVCs, there is no documented evidence of A-fib. Therefore, there is no clear indication for anticoagulation.  However, before taking him off anticoagulation, we will repeat a 30-day monitor to further confirm this.  CAD: Asymptomatic CAD noted on coronary CTA in 2023. Discussed heart healthy diet and lifestyle, particularly reducing high screen intake. Has not tolerated Crestor  in the past due to feeling like a zombie.  Patient is open to trying Lipitor, will start Lipitor 20 mg daily and check lipid panel in 2 months.  If does not tolerate Lipitor, SWB to try Nexlizet at 180-10 mg daily given CAD, mixed hyperlipidemia, and statin intolerance.  Every 4 hour medications to injectable agents, if possible.  If he tolerates Lipitor 20, will uptitrate to achieve LDL goal <70. Should he come off Xarelto  in the future, would recommend  aspirin 81 mg daily.  TAA: Stable ascending aorta aneurysm at 4.2 cm. Will order CTA and echocardiogram alternate year at next visit. Not on ARB or beta-blocker given controlled blood pressure and heart rate.    Meds ordered this encounter  Medications   atorvastatin  (LIPITOR) 20 MG tablet    Sig: Take 1 tablet (20 mg total) by mouth daily.    Dispense:  30 tablet    Refill:  3     F/u in 3 months  Signed, Newman JINNY Lawrence, MD

## 2023-09-29 ENCOUNTER — Ambulatory Visit: Payer: Self-pay | Admitting: Cardiology

## 2023-09-29 DIAGNOSIS — I493 Ventricular premature depolarization: Secondary | ICD-10-CM

## 2023-10-06 DIAGNOSIS — Z961 Presence of intraocular lens: Secondary | ICD-10-CM | POA: Diagnosis not present

## 2023-10-06 DIAGNOSIS — H35372 Puckering of macula, left eye: Secondary | ICD-10-CM | POA: Diagnosis not present

## 2023-10-06 DIAGNOSIS — H401113 Primary open-angle glaucoma, right eye, severe stage: Secondary | ICD-10-CM | POA: Diagnosis not present

## 2023-10-06 DIAGNOSIS — H40022 Open angle with borderline findings, high risk, left eye: Secondary | ICD-10-CM | POA: Diagnosis not present

## 2023-10-23 DIAGNOSIS — H35373 Puckering of macula, bilateral: Secondary | ICD-10-CM | POA: Diagnosis not present

## 2023-10-23 DIAGNOSIS — H31093 Other chorioretinal scars, bilateral: Secondary | ICD-10-CM | POA: Diagnosis not present

## 2023-10-23 DIAGNOSIS — H43812 Vitreous degeneration, left eye: Secondary | ICD-10-CM | POA: Diagnosis not present

## 2023-10-23 DIAGNOSIS — Z961 Presence of intraocular lens: Secondary | ICD-10-CM | POA: Diagnosis not present

## 2023-11-14 ENCOUNTER — Other Ambulatory Visit: Payer: Self-pay | Admitting: Pulmonary Disease

## 2023-11-14 DIAGNOSIS — J449 Chronic obstructive pulmonary disease, unspecified: Secondary | ICD-10-CM

## 2023-11-14 MED ORDER — FLUTICASONE-SALMETEROL 45-21 MCG/ACT IN AERO
2.0000 | INHALATION_SPRAY | Freq: Two times a day (BID) | RESPIRATORY_TRACT | 1 refills | Status: DC
Start: 2023-11-14 — End: 2023-11-14

## 2023-11-14 NOTE — Telephone Encounter (Signed)
 FYI Only or Action Required?: Action required by provider: medication refill request.  Patient is followed in Pulmonology for COPD, last seen on 03/18/2022 by Kara Dorn NOVAK, MD.

## 2023-11-14 NOTE — Telephone Encounter (Signed)
 Copied from CRM [id number]. Topic: Clinical - Medication Refill >> Nov 14, 2023  9:16 AM Russell PARAS wrote: Medication: fluticasone -salmeterol (ADVAIR  HFA) 45-21 MCG/ACT inhaler  Has the patient contacted their pharmacy? Yes (Agent: If no, request that the patient contact the pharmacy for the refill. If patient does not wish to contact the pharmacy document the reason why and proceed with request.) (Agent: If yes, when and what did the pharmacy advise?)  This is the patient's preferred pharmacy:  [id number] - [place], [place] - [address] AT [address] OF [facility] [address] [place] [address] Phone: [phone] Fax: [phone]  Is this the correct pharmacy for this prescription? Yes If no, delete pharmacy and type the correct one.   Has the prescription been filled recently? Yes  Is the patient out of the medication? No  Has the patient been seen for an appointment in the last year OR does the patient have an upcoming appointment? No, scheduled for 01/12/2024 w/Dewald. Requesting bridge refill to cover him until appt  Can we respond through MyChart? Yes  Agent: Please be advised that Rx refills may take up to 3 business days. We ask that you follow-up with your pharmacy.

## 2023-11-20 ENCOUNTER — Other Ambulatory Visit: Payer: Self-pay | Admitting: Cardiology

## 2023-12-01 ENCOUNTER — Ambulatory Visit: Admitting: Cardiology

## 2023-12-02 ENCOUNTER — Ambulatory Visit: Admitting: Podiatry

## 2023-12-02 ENCOUNTER — Encounter: Payer: Self-pay | Admitting: Podiatry

## 2023-12-02 ENCOUNTER — Ambulatory Visit (INDEPENDENT_AMBULATORY_CARE_PROVIDER_SITE_OTHER)

## 2023-12-02 DIAGNOSIS — M76821 Posterior tibial tendinitis, right leg: Secondary | ICD-10-CM

## 2023-12-02 DIAGNOSIS — M19071 Primary osteoarthritis, right ankle and foot: Secondary | ICD-10-CM | POA: Diagnosis not present

## 2023-12-02 NOTE — Progress Notes (Signed)
 Subjective:  Patient ID: Bryan Cowan, male    DOB: 1950/06/15,  MRN: [id number] HPI Chief Complaint  Patient presents with   Foot Pain    Medial foot/arch right - aching x months, did have some orthotics made years ago, but didn't notice any improvement, also did PT for awhile, starting to become more aggravating in the area again, wearing a compression sleeve for support   New Patient (Initial Visit)    Est pt 2019    73 y.o. male presents with the above complaint.   ROS: Denies fever chills nausea mobic muscle aches pains calf pain back pain chest pain shortness of breath.  Past Medical History:  Diagnosis Date   Basal cell carcinoma 05/19/2017   behind left ear cx3 2fu   Cataract 7 years ago   COPD (chronic obstructive pulmonary disease) (HCC) 2021   Coronary artery disease    Glaucoma 2017   History of retinal detachment    Hyperlipidemia    Prostate cancer [facility])    Past Surgical History:  Procedure Laterality Date   BASAL CELL CARCINOMA EXCISION  2019   COLONOSCOPY  2013   EYE SURGERY  2016   retinal detachment   HERNIA REPAIR  2021 and 2022   INGUINAL HERNIA REPAIR Left 11/04/2019   Procedure: LEFT INGUINAL HERNIA REPAIR WITH MESH;  Surgeon: Vanderbilt Ned, MD;  Location: [facility];  Service: General;  Laterality: Left;   INGUINAL HERNIA REPAIR Right 02/07/2021   Procedure: RIGHT INGUINAL HERNIA REPAIR WITH MESH;  Surgeon: Vanderbilt Ned, MD;  Location: [facility];  Service: General;  Laterality: Right;   INSERTION OF MESH Left 11/04/2019   Procedure: INSERTION OF MESH;  Surgeon: Vanderbilt Ned, MD;  Location: [facility];  Service: General;  Laterality: Left;   INSERTION OF MESH Right 02/07/2021   Procedure: INSERTION OF MESH;  Surgeon: Vanderbilt Ned, MD;  Location: [facility];  Service: General;  Laterality: Right;   JOINT REPLACEMENT  2009   bilateral knee replacement   PROSTATECTOMY  2004    RETINAL DETACHMENT SURGERY Right 2015   RETINAL LASER PROCEDURE  2017   SIGMOIDOSCOPY  2000   TOTAL KNEE ARTHROPLASTY Bilateral 2009    Current Outpatient Medications:    atorvastatin  (LIPITOR) 20 MG tablet, TAKE 1 TABLET BY MOUTH EVERY DAY, Disp: 90 tablet, Rfl: 1   brimonidine (ALPHAGAN) 0.2 % ophthalmic solution, Place 1 drop into the right eye 2 (two) times daily., Disp: , Rfl:    fluticasone -salmeterol (ADVAIR  HFA) 45-21 MCG/ACT inhaler, INHALE 2 PUFFS INTO THE LUNGS TWICE A DAY, Disp: 12 each, Rfl: 6   latanoprost (XALATAN) 0.005 % ophthalmic solution, Place 1 drop into both eyes at bedtime., Disp: , Rfl:    nitroGLYCERIN  (NITROSTAT ) 0.4 MG SL tablet, Place 1 tablet (0.4 mg total) under the tongue every 5 (five) minutes as needed for chest pain., Disp: 25 tablet, Rfl: 6   timolol (TIMOPTIC) 0.5 % ophthalmic solution, timolol maleate 0.5 % eye drops  PLACE 1 DROP INTO BOTH EYES TWICE DAILY, Disp: , Rfl:    Ascorbic Acid (VITAMIN C) 1000 MG tablet, Take 1,000 mg by mouth daily., Disp: , Rfl:    b complex vitamins tablet, Take 1 tablet by mouth daily., Disp: , Rfl:    cholecalciferol (VITAMIN D3) 25 MCG (1000 UNIT) tablet, Take 1,000 Units by mouth daily., Disp: , Rfl:    Coenzyme Q10 100 MG capsule, Take 100 mg by mouth. , Disp: ,  Rfl:    magnesium 30 MG tablet, Take 30 mg by mouth daily., Disp: , Rfl:    MILK THISTLE PO, Take by mouth daily., Disp: , Rfl:    Omega-3 Fatty Acids (FISH OIL) 1000 MG CAPS, Take by mouth daily., Disp: , Rfl:   No Known Allergies Review of Systems Objective:  There were no vitals filed for this visit.  General: Well developed, nourished, in no acute distress, alert and oriented x3   Dermatological: Skin is warm, dry and supple bilateral. Nails x 10 are well maintained; remaining integument appears unremarkable at this time. There are no open sores, no preulcerative lesions, no rash or signs of infection present.  Vascular: Dorsalis Pedis artery and  Posterior Tibial artery pedal pulses are 2/4 bilateral with immedate capillary fill time. Pedal hair growth present. No varicosities and no lower extremity edema present bilateral.   Neruologic: Grossly intact via light touch bilateral. Vibratory intact via tuning fork bilateral. Protective threshold with Semmes Wienstein monofilament intact to all pedal sites bilateral. Patellar and Achilles deep tendon reflexes 2+ bilateral. No Babinski or clonus noted bilateral.   Musculoskeletal: No gross boney pedal deformities bilateral. No pain, crepitus, or limitation noted with foot and ankle range of motion bilateral. Muscular strength 5/5 in all groups tested bilateral.  Pain on palpation range of motion of the talonavicular joint.  He has tenderness and pain pain on palpation of the calcaneocuboid joint.  Gait: Unassisted, Nonantalgic.    Radiographs:  Radiographs taken today demonstrate osseously mature individual right foot demonstrates mild tenderness on palpation of the calcaneocuboid joint which MRI has been recommended was osteoarthritic change he also has some tenderness on palpation of the posterior tibial tendon I am at his insertion site of the navicular.  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  Plan: We discussed appropriate shoe gear as well as topical anti-inflammatories.  I recommended that if this were to become more painful that he would need another MRI for comparison.     Meko Bellanger T. [place], [place]

## 2023-12-04 LAB — LIPID PANEL
Chol/HDL Ratio: 3 ratio (ref 0.0–5.0)
Cholesterol, Total: 148 mg/dL (ref 100–199)
HDL: 50 mg/dL (ref >39)
LDL Chol Calc (NIH): 88 mg/dL (ref 0–99)
Triglycerides: 46 mg/dL (ref 0–149)
VLDL Cholesterol Cal: 10 mg/dL (ref 5–40)

## 2023-12-09 DIAGNOSIS — C4442 Squamous cell carcinoma of skin of scalp and neck: Secondary | ICD-10-CM | POA: Diagnosis not present

## 2023-12-09 DIAGNOSIS — D692 Other nonthrombocytopenic purpura: Secondary | ICD-10-CM | POA: Diagnosis not present

## 2023-12-09 DIAGNOSIS — L570 Actinic keratosis: Secondary | ICD-10-CM | POA: Diagnosis not present

## 2023-12-09 DIAGNOSIS — D485 Neoplasm of uncertain behavior of skin: Secondary | ICD-10-CM | POA: Diagnosis not present

## 2023-12-09 DIAGNOSIS — L723 Sebaceous cyst: Secondary | ICD-10-CM | POA: Diagnosis not present

## 2023-12-17 ENCOUNTER — Ambulatory Visit: Attending: Cardiology | Admitting: Cardiology

## 2023-12-17 ENCOUNTER — Encounter: Payer: Self-pay | Admitting: Cardiology

## 2023-12-17 ENCOUNTER — Ambulatory Visit: Attending: Cardiology

## 2023-12-17 VITALS — BP 102/76 | HR 89 | Ht 73.0 in | Wt 201.0 lb

## 2023-12-17 DIAGNOSIS — E782 Mixed hyperlipidemia: Secondary | ICD-10-CM | POA: Diagnosis not present

## 2023-12-17 DIAGNOSIS — I499 Cardiac arrhythmia, unspecified: Secondary | ICD-10-CM | POA: Insufficient documentation

## 2023-12-17 DIAGNOSIS — I25118 Atherosclerotic heart disease of native coronary artery with other forms of angina pectoris: Secondary | ICD-10-CM

## 2023-12-17 DIAGNOSIS — I77810 Thoracic aortic ectasia: Secondary | ICD-10-CM

## 2023-12-17 MED ORDER — ASPIRIN 81 MG PO TBEC: 81.0000 mg | DELAYED_RELEASE_TABLET | Freq: Every day | ORAL | Status: DC

## 2023-12-17 NOTE — Progress Notes (Signed)
 Cardiology Office Note:  .   Date:  12/17/2023  ID:  Bryan Cowan, DOB 18-Jun-1950, MRN [id number] PCP: Thedora Garnette HERO, MD  [facility] Providers Cardiologist:  Newman Lawrence, MD PCP: Thedora Garnette HERO, MD  Chief Complaint  Patient presents with   Irregular heart rhythm     Bryan Cowan is a 73 y.o. male with hyperlipidemia, CAD, TAA  History of Present Illness  Patient is doing well, staying active.  There is no chest pain or shortness of breath.  He is pleased with improvement in his lipid panel, and is tolerating Lipitor 20 mg daily.  However, for last week or so, he has had several notifications on his phone suggesting A-fib.  He was also noticed his higher than usual heart rate during this period.  There is no EKG available for my review.  In the past, patient was put on Xarelto  for suspicion of A-fib, but this was never confirmed on EKG or tracings. Recent monitor did not show A-fib, therefore we had discontinued Xarelto .    Vitals:   12/17/23 0801  BP: 102/76  Pulse: 89  SpO2: 94%      Review of Systems  Cardiovascular:  Negative for chest pain, dyspnea on exertion, leg swelling, palpitations and syncope.        Studies Reviewed: SABRA        EKG 08/25/2023: Sinus rhythm with 1st degree A-V block with occasional and consecutive Premature ventricular complexes No previous ECGs available    CTA chest 06/2023: 1. Stable aneurysmal degeneration of the ascending thoracic aorta measuring up to 4.2 cm. No dissection or high-grade focal stenosis. Continued attention on follow-up is advised.  2.  No acute intrathoracic processes identified.  Mobile cardiac outpatient telemetry 30 days 08/25/2023 - 09/23/2023: Dominant rhythm: Sinus. HR 40-171 bpm. Avg HR 77 bpm. 2 episodes of SVT/atrial tachycardia, fastest at 142 bpm. 1% isolated SVE 1 episodes of VT, fastest at 171 bpm for 6 beats. 1% isolated VE No atrial fibrillation/atrial flutter/high grade AV block, sinus  pause >3sec noted.      Echocardiogram 12/2020:  1. Left ventricular ejection fraction, by estimation, is 50 to 55%. The  left ventricle has low normal function. The left ventricle has no regional  wall motion abnormalities. There is moderate asymmetric left ventricular  hypertrophy of the basal-septal  segment. Left ventricular diastolic parameters were normal.   2. Right ventricular systolic function is normal. The right ventricular  size is normal. Tricuspid regurgitation signal is inadequate for assessing  PA pressure.   3. Left atrial size was mildly dilated.   4. The mitral valve is normal in structure. Trivial mitral valve  regurgitation. No evidence of mitral stenosis.   5. The aortic valve is tricuspid. Aortic valve regurgitation is mild. No  aortic stenosis is present.   6. Aortic dilatation noted. Aneurysm of the aortic root, measuring 47 mm.  There is dilatation of the ascending aorta, measuring 43 mm.    CCTA 2022: 1. Coronary calcium  score of 811. This was 79th percentile for age-, race-, and sex-matched controls. (0 in LM). 2. Normal coronary origin with right dominance. 3. There is severe (>70%) mixed plaque in the mid LCX. There is mild (25-49%) plaque in the LAD, RCA, and RI arteries. CAD-RADS 4.  CT FFR: 1. FFRct findings are consistent with obstructive plaque in OM1 and otherwise diffuse, non-obstructive disease. 2. OM1 is a relatively small vessel around 1.9 mm. Consider aggressive medical management vs. Cardiac catheterization if  clinically warranted.   Labs 11/2023: Chol 148, TG 46, HDL 50, LDL 88  Labs 05/2023: Chol 767, TG 64, HDL 54, LDL 165 Cr 0.77  03/2022: Hb 13.5  Physical Exam Vitals and nursing note reviewed.  Constitutional:      General: He is not in acute distress. Neck:     Vascular: No JVD.  Cardiovascular:     Rate and Rhythm: Normal rate and regular rhythm.     Heart sounds: Normal heart sounds. No murmur heard. Pulmonary:      Effort: Pulmonary effort is normal.     Breath sounds: Normal breath sounds. No wheezing or rales.  Musculoskeletal:     Right lower leg: No edema.     Left lower leg: No edema.      VISIT DIAGNOSES:   ICD-10-CM   1. Coronary artery disease of native artery of native heart with stable angina pectoris  I25.118 Lipid panel    Lipid panel    2. Irregular heart rhythm  I49.9 Ambulatory referral to Cardiac Electrophysiology    LONG TERM MONITOR (3-14 DAYS)    3. Ascending aorta dilatation  I77.810 ECHOCARDIOGRAM COMPLETE    4. Mixed hyperlipidemia  E78.2 Lipid panel    Lipid panel        Bryan Cowan is a 73 y.o. male with hyperlipidemia, CAD, TAA Assessment & Plan  Irregular heart rhythm: Several episodes of reported A-fib on his smart watch, EKG not available for my review. No A-fib was noticed on event monitor in 08/2023. However, it would be important to see if he does indeed have A-fib recurrence. Placed on 2-week Zio monitor.  Would like to correlate 2 weeks monitor tracings with his smart watch, should it show any A-fib. If there is no A-fib on Zio monitor, I do think he would benefit from long-term surveillance for A-fib with implantable loop recorder. I will refer him to EP for possible implantable loop recorder placement.  CAD: Asymptomatic CAD noted on coronary CTA in 2023. Pending decision on Xarelto , recommend aspirin 81 mg daily. LDL down from 160s to 80s on Lipitor 20 mg daily.  He is also making diet and lifestyle changes.  He is reluctant to increase the dose of Lipitor or add any other medication at this time.  Continue Lipitor 20 mg daily for now. Will repeat lipid panel in 3 months.    TAA: Stable ascending aorta aneurysm at 4.2 cm. Will order CTA and echocardiogram alternate year at next visit. Not on ARB or beta-blocker given controlled blood pressure and heart rate. Repeat echocardiogram in 05/2024    Meds ordered this encounter  Medications    aspirin EC 81 MG tablet    Sig: Take 1 tablet (81 mg total) by mouth daily. Swallow whole.     F/u in 05/2024  Signed, Newman JINNY Lawrence, MD

## 2023-12-17 NOTE — Progress Notes (Unsigned)
 Enrolled patient for a 14 day Zio XT  monitor to be mailed to patients home

## 2023-12-17 NOTE — Patient Instructions (Addendum)
 MEDICATION: Start Aspirin 81 mg (possible restart on Xarelto  pending heart monitor result)  Lab Work: Lipid panel in 3 months   If you have labs (blood work) drawn today and your tests are completely normal, you will receive your results only by: MyChart Message (if you have MyChart) OR A paper copy in the mail If you have any lab test that is abnormal or we need to change your treatment, we will call you to review the results.  Testing/Procedures: Echocardiogram in 05/2024   Your physician has requested that you have an echocardiogram. Echocardiography is a painless test that uses sound waves to create images of your heart. It provides your doctor with information about the size and shape of your heart and how well your heart's chambers and valves are working. This procedure takes approximately one hour. There are no restrictions for this procedure. Please do NOT wear cologne, perfume, aftershave, or lotions (deodorant is allowed). Please arrive 15 minutes prior to your appointment time.  Please note: We ask at that you not bring children with you during ultrasound (echo/ vascular) testing. Due to room size and safety concerns, children are not allowed in the ultrasound rooms during exams. Our front office staff cannot provide observation of children in our lobby area while testing is being conducted. An adult accompanying a patient to their appointment will only be allowed in the ultrasound room at the discretion of the ultrasound technician under special circumstances. We apologize for any inconvenience.  2 week ZIO monitor  Your physician has requested that you wear a Zio heart monitor for __14___ days. This will be mailed to your home with instructions on how to apply the monitor and how to return it when finished. Please allow 2 weeks after returning the heart monitor before our office calls you with the results.   REFERRAL TO ELECTROPHYSIOLOGY PENDING RESULT OF HEART MONITOR    Follow-Up: At [facility], you and your health needs are our priority.  As part of our continuing mission to provide you with exceptional heart care, our providers are all part of one team.  This team includes your primary Cardiologist (physician) and Advanced Practice Providers or APPs (Physician Assistants and Nurse Practitioners) who all work together to provide you with the care you need, when you need it.  Your next appointment:   05/2024 AFTER ECHO   Provider:   Newman JINNY Lawrence, MD

## 2024-01-01 DIAGNOSIS — C4442 Squamous cell carcinoma of skin of scalp and neck: Secondary | ICD-10-CM | POA: Diagnosis not present

## 2024-01-06 DIAGNOSIS — I498 Other specified cardiac arrhythmias: Secondary | ICD-10-CM | POA: Diagnosis not present

## 2024-01-06 DIAGNOSIS — I4891 Unspecified atrial fibrillation: Secondary | ICD-10-CM | POA: Diagnosis not present

## 2024-01-09 ENCOUNTER — Telehealth: Payer: Self-pay | Admitting: Cardiology

## 2024-01-09 MED ORDER — RIVAROXABAN 20 MG PO TABS: 20.0000 mg | ORAL_TABLET | Freq: Every day | ORAL | 3 refills | Status: DC

## 2024-01-09 NOTE — Telephone Encounter (Signed)
 Prescription for Xarelto  20 mg daily. Pt agrees with plan of stopping aspirin  as well, per verbal Dr. Wendel.

## 2024-01-09 NOTE — Telephone Encounter (Signed)
 Confirmed heart monitor results have been received, awaiting review by Dr. Elmira.  Patient states he received 40-50 alerts on his Apple Watch notifying him he's been in A-Fib, denies any symptoms.  Patient is asking if he will need to go back on blood thinners or if he needs to wait until appt with EP to determine this. Also asking if he still needs appt in January with EP for ILR implant.  Will forward to Dr. Elmira to review.

## 2024-01-09 NOTE — Telephone Encounter (Signed)
 Patient would like to confirm his monitor results have been received/reviewed.

## 2024-01-09 NOTE — Telephone Encounter (Signed)
 Agree. Thank you Dr. Wendel.  Thanks MJP

## 2024-01-10 ENCOUNTER — Encounter: Payer: Self-pay | Admitting: Cardiology

## 2024-01-12 ENCOUNTER — Encounter: Payer: Self-pay | Admitting: Pulmonary Disease

## 2024-01-12 ENCOUNTER — Ambulatory Visit: Admitting: Pulmonary Disease

## 2024-01-12 VITALS — BP 145/86 | HR 82 | Ht 72.5 in | Wt 203.0 lb

## 2024-01-12 DIAGNOSIS — I480 Paroxysmal atrial fibrillation: Secondary | ICD-10-CM | POA: Diagnosis not present

## 2024-01-12 DIAGNOSIS — R0683 Snoring: Secondary | ICD-10-CM | POA: Diagnosis not present

## 2024-01-12 DIAGNOSIS — J449 Chronic obstructive pulmonary disease, unspecified: Secondary | ICD-10-CM | POA: Diagnosis not present

## 2024-01-12 MED ORDER — FLUTICASONE-SALMETEROL 115-21 MCG/ACT IN AERO: 2.0000 | INHALATION_SPRAY | Freq: Two times a day (BID) | RESPIRATORY_TRACT | 12 refills | Status: DC

## 2024-01-12 NOTE — Progress Notes (Signed)
 Synopsis: Referred in November 2022 for abnormal chest x-ray by Garnette Simpler, MD  Subjective:   PATIENT ID: Bryan Cowan GENDER: male DOB: Oct 19, 1950, MRN: [id number]  HPI  Chief Complaint  Patient presents with   Medical Management of Chronic Issues    Pt states as the weather changes breathing becomes worse    Bryan Cowan is a 73 year old man, never smoker with history of prostate cancer and hyperlipidemia who returns to pulmonary clinic for bronchitis and COPD.  Discussed the use of AI scribe software for clinical note transcription with the patient, who gave verbal consent to proceed.  History of Present Illness Bryan Cowan is a 73 year old male who presents with increased cough and chest congestion.  He experiences increased coughing and thicker sputum, particularly during the fall season. There is no sinus congestion or drainage, and no wheezing, but his voice sometimes feels weakened. He uses an Advair  inhaler at a lower dose twice a day, previously at a higher dose.  He mentions snoring during sleep, which has improved with side sleeping. He has been using mouth tape to reduce snoring.    OV 03/18/22 He stopped taking the low dose advair  in September. Over the last couple of weeks he reports cough with sputum production. He resumed the low dose advair  with improvement in the cough and sputum production.   OV 09/10/21 Patient had RSV infection at beginning of month with increase in cough symptoms. He was resumed back on advair  115-72mcg 2 puffs twice daily as he was tapered to advair  45-6mcg at last visit.  He will occasionally have a coughing fit still at night. No exertional dyspnea.   OV 05/2021 He was started on advair  115-39mcg 2 puffs twice daily at last visit and has noticed a significant improvement in his breathing and cough.  He has stopped taking PPI and done well from that standpoint. He is sleeping with head of bed elevated.   OV 02/05/21 Patient was started  on pantoprazole  40 mg daily for history of reflux which she notes improvement in his cough and wheezing since starting this medication.  He has not started ICS/LABA inhaler therapy with Advair  as he is waiting for his insurance to change over in the new year.  Pulmonary function tests show mild obstructive defect with normal TLC and normal DLCO.  HRCT chest shows no significant pulmonary parenchymal findings.  There are scattered airways with mild bronchial thickening.  OV 12/20/20 Patient reports developing shortness of breath, cough with clear sputum production and intermittent wheezing over the past 3 to 6 months.  He had a chest radiograph on 11/27/2020 which showed calcified granuloma of the right upper lobe and calcified right hilar lymph node along with scattered peribronchial thickening.  He is a never smoker but reports being exposed to secondhand smoke exposure from both his parents in childhood.  In the past he has had issues with seasonal allergies but over recent years denies any symptoms from seasonal allergies.  He does report reflux symptoms on a regular basis.  His wife has asked if the timolol eyedrops that he was started on 6 months ago could be contributing to his respiratory symptoms.  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  He denies any significant dust or chemical exposures over the years.  Past Medical History:  Diagnosis Date   Basal cell carcinoma 05/19/2017   behind left ear cx3 13fu   Cataract  7 years ago   COPD (chronic obstructive pulmonary disease) (HCC) 2021   Coronary artery disease    Glaucoma 2017   History of retinal detachment    Hyperlipidemia    Prostate cancer (HCC)      Family History  Problem Relation Age of Onset   COPD Mother    Cancer Father        Lung   Cancer Daughter        Melanoma   Cancer Paternal Uncle        Liver   Cancer Daughter    Colon cancer Neg Hx    Esophageal cancer Neg Hx     Prostate cancer Neg Hx    Rectal cancer Neg Hx      Social History   Socioeconomic History   Marital status: Married    Spouse name: Not on file   Number of children: 3   Years of education: Not on file   Highest education level: Master's degree (e.g., MA, MS, MEng, MEd, MSW, MBA)  Occupational History   Occupation: Retired- HR Management  Tobacco Use   Smoking status: Never   Smokeless tobacco: Never  Vaping Use   Vaping status: Never Used  Substance and Sexual Activity   Alcohol use: Yes    Alcohol/week: 5.0 - 10.0 standard drinks of alcohol    Types: 5 - 10 Cans of beer per week    Comment: 1-2 drinks per day   Drug use: Never   Sexual activity: Not Currently  Other Topics Concern   Not on file  Social History Narrative   Moved here from [place], [place] in 05/2015.   Social Drivers of Corporate Investment Banker Strain: Low Risk  (06/04/2023)   Overall Financial Resource Strain (CARDIA)    Difficulty of Paying Living Expenses: Not hard at all  Food Insecurity: No Food Insecurity (06/04/2023)   Hunger Vital Sign    Worried About Running Out of Food in the Last Year: Never true    Ran Out of Food in the Last Year: Never true  Transportation Needs: No Transportation Needs (06/04/2023)   PRAPARE - Administrator, Civil Service (Medical): No    Lack of Transportation (Non-Medical): No  Physical Activity: Sufficiently Active (06/04/2023)   Exercise Vital Sign    Days of Exercise per Week: 7 days    Minutes of Exercise per Session: 90 min  Stress: No Stress Concern Present (06/04/2023)   [employer] of Occupational Health - Occupational Stress Questionnaire    Feeling of Stress : Only a little  Social Connections: Socially Integrated (06/04/2023)   Social Connection and Isolation Panel    Frequency of Communication with Friends and Family: Three times a week    Frequency of Social Gatherings with Friends and Family: Twice a week    Attends Religious  Services: 1 to 4 times per year    Active Member of [employer] or Organizations: Yes    Attends Banker Meetings: 1 to 4 times per year    Marital Status: Married  Catering Manager Violence: Not At Risk (04/10/2021)   Humiliation, Afraid, Rape, and Kick questionnaire    Fear of Current or Ex-Partner: No    Emotionally Abused: No    Physically Abused: No    Sexually Abused: No     No Known Allergies   Outpatient Medications Prior to Visit  Medication Sig Dispense Refill   Ascorbic Acid (VITAMIN C) 1000 MG tablet Take  1,000 mg by mouth daily.     atorvastatin  (LIPITOR) 20 MG tablet TAKE 1 TABLET BY MOUTH EVERY DAY 90 tablet 1   b complex vitamins tablet Take 1 tablet by mouth daily.     brimonidine (ALPHAGAN) 0.2 % ophthalmic solution Place 1 drop into the right eye 2 (two) times daily.     cholecalciferol (VITAMIN D3) 25 MCG (1000 UNIT) tablet Take 1,000 Units by mouth daily.     Coenzyme Q10 100 MG capsule Take 100 mg by mouth.      latanoprost (XALATAN) 0.005 % ophthalmic solution Place 1 drop into both eyes at bedtime.     magnesium 30 MG tablet Take 30 mg by mouth daily.     MILK THISTLE PO Take by mouth daily.     nitroGLYCERIN  (NITROSTAT ) 0.4 MG SL tablet Place 1 tablet (0.4 mg total) under the tongue every 5 (five) minutes as needed for chest pain. 25 tablet 6   Omega-3 Fatty Acids (FISH OIL) 1000 MG CAPS Take by mouth daily.     rivaroxaban  (XARELTO ) 20 MG TABS tablet Take 1 tablet (20 mg total) by mouth daily with supper. 30 tablet 3   timolol (TIMOPTIC) 0.5 % ophthalmic solution timolol maleate 0.5 % eye drops  PLACE 1 DROP INTO BOTH EYES TWICE DAILY     fluticasone -salmeterol (ADVAIR  HFA) 45-21 MCG/ACT inhaler INHALE 2 PUFFS INTO THE LUNGS TWICE A DAY 12 each 6   No facility-administered medications prior to visit.   Review of Systems  Constitutional:  Negative for chills, fever, malaise/fatigue and weight loss.  HENT:  Negative for congestion, sinus pain and sore  throat.   Eyes: Negative.   Respiratory:  Positive for cough and sputum production. Negative for hemoptysis, shortness of breath and wheezing.   Cardiovascular:  Negative for chest pain, palpitations, orthopnea, claudication and leg swelling.  Gastrointestinal:  Negative for abdominal pain, heartburn, nausea and vomiting.  Genitourinary: Negative.   Musculoskeletal:  Negative for joint pain and myalgias.  Skin:  Negative for rash.  Neurological:  Negative for weakness.  Endo/Heme/Allergies: Negative.   Psychiatric/Behavioral: Negative.      Objective:   Vitals:   01/12/24 0820  BP: (!) 145/86  Pulse: 82  SpO2: 98%  Weight: 203 lb (92.1 kg)  Height: 6' 0.5 (1.842 m)   Physical Exam Constitutional:      General: He is not in acute distress. HENT:     Head: Normocephalic and atraumatic.  Eyes:     Conjunctiva/sclera: Conjunctivae normal.  Cardiovascular:     Rate and Rhythm: Normal rate and regular rhythm.     Pulses: Normal pulses.     Heart sounds: Normal heart sounds. No murmur heard. Pulmonary:     Effort: Pulmonary effort is normal.     Breath sounds: No wheezing, rhonchi or rales.  Musculoskeletal:     Right lower leg: No edema.     Left lower leg: No edema.  Skin:    General: Skin is warm and dry.  Neurological:     Mental Status: He is alert.    CBC    Component Value Date/Time   WBC 5.0 04/01/2022 0734   WBC 6.0 11/01/2019 0939   RBC 5.01 04/01/2022 0734   RBC 5.19 11/01/2019 0939   HGB 13.5 04/01/2022 0734   HCT 42.6 04/01/2022 0734   PLT 205 04/01/2022 0734   MCV 85 04/01/2022 0734   MCH 26.9 04/01/2022 0734   MCH 26.6 11/01/2019 0939  MCHC 31.7 04/01/2022 0734   MCHC 31.7 11/01/2019 0939   RDW 13.1 04/01/2022 0734   LYMPHSABS 2.0 11/01/2019 0939   MONOABS 0.6 11/01/2019 0939   EOSABS 0.5 11/01/2019 0939   BASOSABS 0.1 11/01/2019 0939      Latest Ref Rng & Units 06/09/2023   10:05 AM 04/01/2022    7:34 AM 08/07/2021    8:16 AM  BMP   Glucose 70 - 99 mg/dL 97  98  897   BUN 6 - 23 mg/dL 12  14  9    Creatinine 0.40 - 1.50 mg/dL 9.22  9.20  9.17   BUN/Creat Ratio 10 - 24  18  11    Sodium 135 - 145 mEq/L 136  137  136   Potassium 3.5 - 5.1 mEq/L 3.8  3.9  4.1   Chloride 96 - 112 mEq/L 102  100  100   CO2 19 - 32 mEq/L 26  25  26    Calcium  8.4 - 10.5 mg/dL 9.1  9.3  9.4    Chest imaging: CXR 11/27/2020 1. Mild diffuse peribronchial cuffing which could be seen in the setting of acute or chronic bronchitis. 2. Old granulomatous disease, as above. 3. Aortic atherosclerosis.  PFT:    Latest Ref Rng & Units 02/05/2021    9:55 AM  PFT Results  FVC-Pre L 3.98   FVC-Predicted Pre % 81   FVC-Post L 4.24   FVC-Predicted Post % 87   Pre FEV1/FVC % % 64   Post FEV1/FCV % % 68   FEV1-Pre L 2.54   FEV1-Predicted Pre % 71   FEV1-Post L 2.89   DLCO uncorrected ml/min/mmHg 26.44   DLCO UNC% % 94   DLCO corrected ml/min/mmHg 26.44   DLCO COR %Predicted % 94   DLVA Predicted % 90   TLC L 8.33   TLC % Predicted % 108   RV % Predicted % 158     Labs:  Path:  Echo:  Heart Catheterization:  Assessment & Plan:   Chronic obstructive pulmonary disease, unspecified COPD type (HCC) - Plan: fluticasone -salmeterol (ADVAIR  HFA) 115-21 MCG/ACT inhaler  Snoring - Plan: Home sleep test  Paroxysmal atrial fibrillation (HCC) - Plan: Home sleep test  Assessment and Plan Assessment & Plan Chronic obstructive pulmonary disease (COPD) Increased cough and thicker sputum likely due to temperature changes or allergens. No sinus congestion, drainage, or wheezing. Activity level remains good. - Increased Advair  inhaler to 115/21 mcg dose. - Instructed to retain lower dose inhaler for potential future use.  Paroxysmal atrial fibrillation - followed by cardiology - Will consider home sleep study to evaluate for sleep apnea as a potential trigger for atrial fibrillation.  Snoring and evaluation for possible sleep apnea Snoring  decreased with side sleeping. Potential link between sleep apnea and atrial fibrillation discussed. - Ordered home sleep study to evaluate for sleep apnea. - If significant sleep apnea is detected, will consider CPAP therapy.    Dorn Chill, MD [facility] Office: [phone]    Current Outpatient Medications:    Ascorbic Acid (VITAMIN C) 1000 MG tablet, Take 1,000 mg by mouth daily., Disp: , Rfl:    atorvastatin  (LIPITOR) 20 MG tablet, TAKE 1 TABLET BY MOUTH EVERY DAY, Disp: 90 tablet, Rfl: 1   b complex vitamins tablet, Take 1 tablet by mouth daily., Disp: , Rfl:    brimonidine (ALPHAGAN) 0.2 % ophthalmic solution, Place 1 drop into the right eye 2 (two) times daily., Disp: , Rfl:  cholecalciferol (VITAMIN D3) 25 MCG (1000 UNIT) tablet, Take 1,000 Units by mouth daily., Disp: , Rfl:    Coenzyme Q10 100 MG capsule, Take 100 mg by mouth. , Disp: , Rfl:    fluticasone -salmeterol (ADVAIR  HFA) 115-21 MCG/ACT inhaler, Inhale 2 puffs into the lungs 2 (two) times daily., Disp: 1 each, Rfl: 12   latanoprost (XALATAN) 0.005 % ophthalmic solution, Place 1 drop into both eyes at bedtime., Disp: , Rfl:    magnesium 30 MG tablet, Take 30 mg by mouth daily., Disp: , Rfl:    MILK THISTLE PO, Take by mouth daily., Disp: , Rfl:    nitroGLYCERIN  (NITROSTAT ) 0.4 MG SL tablet, Place 1 tablet (0.4 mg total) under the tongue every 5 (five) minutes as needed for chest pain., Disp: 25 tablet, Rfl: 6   Omega-3 Fatty Acids (FISH OIL) 1000 MG CAPS, Take by mouth daily., Disp: , Rfl:    rivaroxaban  (XARELTO ) 20 MG TABS tablet, Take 1 tablet (20 mg total) by mouth daily with supper., Disp: 30 tablet, Rfl: 3   timolol (TIMOPTIC) 0.5 % ophthalmic solution, timolol maleate 0.5 % eye drops  PLACE 1 DROP INTO BOTH EYES TWICE DAILY, Disp: , Rfl:

## 2024-01-12 NOTE — Telephone Encounter (Signed)
 Please review and advise.

## 2024-01-12 NOTE — Telephone Encounter (Signed)
 I share your concerns about bleeding.  I blood thinners, either aspirin  or Xarelto  would increase the risk of easy bleeding, these are not usually dangerous.  Continue to monitor for any signs of internal bleeding such as blood in stool or black-colored stools.  Unfortunately, aspirin  is not adequate to reduce stroke risk arising from atrial fibrillation.  Bleeding risk with aspirin  is no less then compared to Xarelto .  Therefore, I do think Xarelto  would be better for stroke prevention, and you would not need to take aspirin .  If cost of Xarelto  is a concern, we can certainly look into ways to reduce cost, including manufacture discount.  Group, please look into if you would qualify for free trial or discount?    Thanks MJP

## 2024-01-12 NOTE — Telephone Encounter (Signed)
Would patient qualify for assistance?

## 2024-01-12 NOTE — Patient Instructions (Addendum)
 We will check a home sleep study as part of your evaluation for atrial fibrillation and the history of snoring  We will increase your advair  inhaler dose for the cough and mucous production  Use advair  115-25mcg 2 puffs twice daily - rinse mouth out after each use  Follow up in 1 year, call sooner if needed

## 2024-01-13 ENCOUNTER — Telehealth: Payer: Self-pay | Admitting: Pharmacy Technician

## 2024-01-13 ENCOUNTER — Ambulatory Visit: Payer: Self-pay | Admitting: Cardiology

## 2024-01-13 ENCOUNTER — Other Ambulatory Visit (HOSPITAL_COMMUNITY): Payer: Self-pay

## 2024-01-13 DIAGNOSIS — I499 Cardiac arrhythmia, unspecified: Secondary | ICD-10-CM | POA: Diagnosis not present

## 2024-01-13 NOTE — Progress Notes (Signed)
 It appears that patient is in A-fib 100% of the times.  He would need at least 4 to 6 weeks of anticoagulation before considering cardioversion.  Since he was started on Eliquis only recently, would hold off cardioversion for now.  He has an appointment with Dr. Nancey on 02/26/2023.  We could consider cardioversion after that if Dr. Nancey would recommend the same.  Thanks MJP

## 2024-01-13 NOTE — Progress Notes (Signed)
 The patient has been notified of the result and verbalized understanding. All questions (if any) were answered.   Patient mention that he is taking Xarelto  instead of Eliquis.

## 2024-01-13 NOTE — Telephone Encounter (Signed)
 Please review and advised concerning medication options.

## 2024-01-13 NOTE — Telephone Encounter (Signed)
    Xarelto /eliquis/pradaxa

## 2024-01-14 NOTE — Telephone Encounter (Signed)
 I recommended continuing Eliquis on a separate thread before I read this message.  If Eliquis is expensive, I am okay with Pradaxa 150 mg twice daily.  Thanks MJP

## 2024-01-14 NOTE — Telephone Encounter (Signed)
 Spoke to patient and he has already picked up a month supply of Xarelto  and will start taking it. Pt states that he would like to switch over to the Pradaxa. Advised pt to start taking the Xarelto  since he already picked up the prescription then to contact office when closer to running out so that script for Pradaxa can be placed. Pt agrees with plan of care.

## 2024-01-14 NOTE — Progress Notes (Signed)
 Noted.  Continue the same.  Thanks MJP

## 2024-01-23 ENCOUNTER — Encounter (HOSPITAL_BASED_OUTPATIENT_CLINIC_OR_DEPARTMENT_OTHER): Payer: Self-pay | Admitting: Surgery

## 2024-01-26 ENCOUNTER — Encounter

## 2024-01-26 DIAGNOSIS — R0683 Snoring: Secondary | ICD-10-CM

## 2024-01-26 DIAGNOSIS — I480 Paroxysmal atrial fibrillation: Secondary | ICD-10-CM

## 2024-01-30 ENCOUNTER — Encounter: Payer: Self-pay | Admitting: Cardiology

## 2024-01-30 NOTE — Telephone Encounter (Signed)
 Routed to pharmacy team for transition of OAC

## 2024-01-30 NOTE — Telephone Encounter (Signed)
 Pt calling in about starting Pradaxa.

## 2024-02-02 MED ORDER — DABIGATRAN ETEXILATE MESYLATE 150 MG PO CAPS: 150.0000 mg | ORAL_CAPSULE | Freq: Two times a day (BID) | ORAL | 5 refills | Status: AC

## 2024-02-04 ENCOUNTER — Ambulatory Visit: Admitting: Cardiology

## 2024-02-16 DIAGNOSIS — G4739 Other sleep apnea: Secondary | ICD-10-CM | POA: Diagnosis not present

## 2024-02-17 ENCOUNTER — Telehealth: Payer: Self-pay

## 2024-02-17 NOTE — Telephone Encounter (Signed)
 Copied from CRM [id number]. Topic: Clinical - Lab/Test Results >> Feb 17, 2024  1:24 PM Corean SAUNDERS wrote: Reason for CRM: Patient is requesting a call back from Dr. Kara or nurse as he was advised that his sleep study results should be available by the sleep study company.   Dr. Kara can you advise of HST study results.

## 2024-02-17 NOTE — Telephone Encounter (Signed)
 Please let patient know his sleep study shows severe obstructive sleep apnea where he stops breathing or is under breathing 31 times per hour.   Recommend starting auto-titrating CPAP therapy or undergoing a CPAP titration study at the [facility].  Please let me know what he prefers and we will place orders.  Thanks, JD

## 2024-02-18 ENCOUNTER — Encounter: Payer: Self-pay | Admitting: Cardiology

## 2024-02-18 NOTE — Telephone Encounter (Signed)
 Spoke with Bryan Cowan he would like to wait until seeing his cardiologist   on 1/8 , but more than likely he would like to do the   titration study at the [facility].

## 2024-02-19 ENCOUNTER — Ambulatory Visit: Payer: Self-pay | Admitting: Pulmonary Disease

## 2024-02-19 ENCOUNTER — Encounter: Payer: Self-pay | Admitting: Pulmonary Disease

## 2024-02-20 ENCOUNTER — Other Ambulatory Visit: Payer: Self-pay

## 2024-02-20 DIAGNOSIS — G4733 Obstructive sleep apnea (adult) (pediatric): Secondary | ICD-10-CM

## 2024-02-20 NOTE — Telephone Encounter (Signed)
 Spoke with patient  VBU, order has been placed

## 2024-02-23 ENCOUNTER — Encounter: Payer: Self-pay | Admitting: Cardiology

## 2024-02-24 NOTE — Telephone Encounter (Signed)
 That is correct.  No monitor necessary.  Agree with ongoing management of  OSA with CPAP.  It is reasonable to consider cardioversion at some point after being on blood thinner for at least 4 weeks.  We can discuss this further during a follow-up office visit in the near future.  At this time, I would recommend an echocardiogram to assess size of the heart chambers to see if cardioversion would be worth considering.  Thanks MJP

## 2024-02-26 ENCOUNTER — Ambulatory Visit: Admitting: Cardiovascular Disease

## 2024-02-27 LAB — LIPID PANEL
Chol/HDL Ratio: 2.3 ratio (ref 0.0–5.0)
Cholesterol, Total: 136 mg/dL (ref 100–199)
HDL: 59 mg/dL
LDL Chol Calc (NIH): 67 mg/dL (ref 0–99)
Triglycerides: 42 mg/dL (ref 0–149)
VLDL Cholesterol Cal: 10 mg/dL (ref 5–40)

## 2024-03-03 ENCOUNTER — Telehealth: Payer: Self-pay

## 2024-03-03 NOTE — Telephone Encounter (Signed)
 Called patient to get him set up for an appointment to be evaluated for his hand. Patient's background was loud and could barely hear him and he could barely hear me. He yelled that he is at the emergency room and that is why I can not hear him. I asked if he was there for his hand because I was calling to get him set up for an appointment. I could not hear patient and tried to call him back and it went to voicemail.

## 2024-03-03 NOTE — Telephone Encounter (Signed)
 Copied from CRM [id number]. Topic: Clinical - Request for Lab/Test Order >> Mar 03, 2024  8:24 AM Mercedes MATSU wrote: Reason for CRM: Patient is requesting a order for an xray for left wrist, patient said he has been having on going pain. He states he was wrestling with his grandson and thinks he chipped a bone. He said he has had this issue for over ten years now. He is also requesting a call back from the nurse, once and if the order can be placed. He can be reached at [phone], patient did not disclose where he would like the order sent. The phone service was not the best, he requested a phone call after 12pm if possible.

## 2024-03-11 NOTE — Telephone Encounter (Signed)
 My MyChart message to the patient on 02/24/2024 as below  That is correct.  No monitor necessary.  Agree with ongoing management of  OSA with CPAP.  It is reasonable to consider cardioversion at some point after being on blood thinner for at least 4 weeks.  We can discuss this further during a follow-up office visit in the near future.  At this time, I would recommend an echocardiogram to assess size of the heart chambers to see if cardioversion would be worth considering.   Thanks MJP

## 2024-03-11 NOTE — Telephone Encounter (Signed)
 I meant no loop recorder necessary since diagnosis of Afib is made.  Thanks MJP

## 2024-03-16 ENCOUNTER — Encounter: Payer: Self-pay | Admitting: Pulmonary Disease

## 2024-03-19 ENCOUNTER — Encounter: Payer: Self-pay | Admitting: Family Medicine

## 2024-03-19 ENCOUNTER — Ambulatory Visit (INDEPENDENT_AMBULATORY_CARE_PROVIDER_SITE_OTHER): Admitting: Family Medicine

## 2024-03-19 ENCOUNTER — Telehealth: Payer: Self-pay

## 2024-03-19 ENCOUNTER — Ambulatory Visit

## 2024-03-19 VITALS — BP 134/78 | HR 88 | Temp 98.1°F | Ht 72.5 in | Wt 205.0 lb

## 2024-03-19 DIAGNOSIS — M79642 Pain in left hand: Secondary | ICD-10-CM | POA: Diagnosis not present

## 2024-03-19 DIAGNOSIS — J449 Chronic obstructive pulmonary disease, unspecified: Secondary | ICD-10-CM

## 2024-03-19 MED ORDER — FLUTICASONE-SALMETEROL 250-50 MCG/ACT IN AEPB: 1.0000 | INHALATION_SPRAY | Freq: Two times a day (BID) | RESPIRATORY_TRACT | 11 refills | Status: AC

## 2024-03-19 NOTE — Telephone Encounter (Signed)
Inhaler rx sent to pharmacy

## 2024-03-19 NOTE — Progress Notes (Signed)
 " [facility] [address] [place] [address] Dept: [phone] Dept Fax: [phone]  Office Visit  Subjective:    Patient ID: Bryan Cowan, male    DOB: [birth year], 74 y.o..   MRN: [id number]  Chief Complaint  Patient presents with   Wrist Pain    C/o having LT wrist pain x off/on years.  Have taken Advil  occasionally and use wrist brace.    History of Present Illness:  Bryan Cowan is in today for an exacerbation of a remote injury. He notes an injury to his left hand/wrist 10-12 years ago. At the time, he struck this on a coffee table. He recalls feeling like there might have been a chip present. However, he never went to get this looked at. Periodically, he has a flare of pain in this area. Over the last several weeks he had experienced a constant ache accompanied by very sharp pain and noticeable weakness in hand and wrist, radiating up the arm. However, today, he notes this has improved. He decided to keep the appointment to hav this assessed.  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   Left retinal lattice degeneration 09/13/2019   Primary open angle glaucoma of right eye, severe stage 09/13/2019   Optic disc atrophy, right 09/13/2019   Macular pucker, left eye 09/13/2019   History of retinal detachment 01/13/2018   Vision loss, right eye 01/13/2018   Hyperlipidemia    History of  prostate cancer 02/29/2016   Bilateral tinnitus 02/29/2016   Bilateral hearing loss 02/29/2016   Past Surgical History:  Procedure Laterality Date   BASAL CELL CARCINOMA EXCISION  2019   COLONOSCOPY  2013   EYE SURGERY  2016   retinal detachment   HERNIA REPAIR  2021 and 2022   INGUINAL HERNIA REPAIR Left 11/04/2019   Procedure: LEFT INGUINAL HERNIA REPAIR WITH MESH;  Surgeon: Vanderbilt Ned, MD;  Location: [facility];  Service: General;  Laterality: Left;   INGUINAL HERNIA REPAIR Right 02/07/2021   Procedure: RIGHT INGUINAL HERNIA REPAIR WITH MESH;  Surgeon: Vanderbilt Ned, MD;  Location: [facility];  Service: General;  Laterality: Right;   INSERTION OF MESH Left 11/04/2019   Procedure: INSERTION OF MESH;  Surgeon: Vanderbilt Ned, MD;  Location: [facility];  Service: General;  Laterality: Left;   INSERTION OF MESH Right 02/07/2021   Procedure: INSERTION OF MESH;  Surgeon: Vanderbilt Ned, MD;  Location: [facility];  Service: General;  Laterality: Right;   JOINT REPLACEMENT  2009   bilateral knee replacement   PROSTATECTOMY  2004   RETINAL DETACHMENT SURGERY Right 2015   RETINAL LASER PROCEDURE  2017   SIGMOIDOSCOPY  2000   TOTAL KNEE ARTHROPLASTY Bilateral 2009   Family History  Problem Relation Age of Onset   COPD Mother    Cancer Father        Lung   Cancer Daughter        Melanoma   Cancer  Paternal Uncle        Liver   Cancer Daughter    Colon cancer Neg Hx    Esophageal cancer Neg Hx    Prostate cancer Neg Hx    Rectal cancer Neg Hx    Outpatient Medications Prior to Visit  Medication Sig Dispense Refill   Ascorbic Acid (VITAMIN C) 1000 MG tablet Take 1,000 mg by mouth daily.     atorvastatin  (LIPITOR) 20 MG tablet TAKE 1 TABLET BY MOUTH EVERY DAY 90 tablet 1   b complex vitamins tablet Take 1 tablet by mouth daily.     brimonidine (ALPHAGAN) 0.2 % ophthalmic solution Place 1 drop into the right eye 2  (two) times daily.     cholecalciferol (VITAMIN D3) 25 MCG (1000 UNIT) tablet Take 1,000 Units by mouth daily.     Coenzyme Q10 100 MG capsule Take 100 mg by mouth.      dabigatran  (PRADAXA ) 150 MG CAPS capsule Take 1 capsule (150 mg total) by mouth 2 (two) times daily. 60 capsule 5   fluticasone -salmeterol (ADVAIR  HFA) 115-21 MCG/ACT inhaler Inhale 2 puffs into the lungs 2 (two) times daily. 1 each 12   latanoprost (XALATAN) 0.005 % ophthalmic solution Place 1 drop into both eyes at bedtime.     magnesium 30 MG tablet Take 30 mg by mouth daily.     MILK THISTLE PO Take by mouth daily.     nitroGLYCERIN  (NITROSTAT ) 0.4 MG SL tablet Place 1 tablet (0.4 mg total) under the tongue every 5 (five) minutes as needed for chest pain. 25 tablet 6   Omega-3 Fatty Acids (FISH OIL) 1000 MG CAPS Take by mouth daily.     timolol (TIMOPTIC) 0.5 % ophthalmic solution timolol maleate 0.5 % eye drops  PLACE 1 DROP INTO BOTH EYES TWICE DAILY     No facility-administered medications prior to visit.   Allergies[1]   Objective:   Today's Vitals   03/19/24 1501  BP: 134/78  Pulse: 88  Temp: 98.1 F (36.7 C)  TempSrc: Temporal  SpO2: 96%  Weight: 205 lb (93 kg)  Height: 6' 0.5 (1.842 m)   Body mass index is 27.42 kg/m.   General: Well developed, well nourished. No acute distress. Extremities: Full ROM. No joint swelling or tenderness. There may be a small bony or tendonous nodule over the ulnar aspect of the 5th metacarpal. No increased pain with resisted ulnar deviation or passive radial deviation of the hand. The wrist joint shows no crepitance of subluxation.  Health Maintenance Due  Topic Date Due   Medicare Annual Wellness (AWV)  04/13/2023   Influenza Vaccine  09/19/2023   COVID-19 Vaccine (4 - 2025-26 season) 10/20/2023   Imaging: Left hand- No fracture, dislocation or other bony abnormality noted.    Assessment & Plan:   Problem List Items Addressed This Visit   None Visit Diagnoses        Left hand pain    -  Primary   Suspect his may represent a prior injury of the flexor carpi ulnaris insertion. Recommend rest, ice, and naproxen for flares.   Relevant Orders   DG Hand Complete Left       Return if symptoms worsen or fail to improve.    Garnette CHRISTELLA Simpler, MD  I,Emily Lagle,acting as a scribe for Garnette CHRISTELLA Simpler, MD.,have documented all relevant documentation on the behalf of Garnette CHRISTELLA Simpler, MD.  I, Garnette CHRISTELLA Simpler, MD, have reviewed all documentation for this visit.  The documentation on 03/19/2024 for the exam, diagnosis, procedures, and orders are all accurate and complete.      [1] No Known Allergies  "

## 2024-03-22 ENCOUNTER — Ambulatory Visit: Admitting: Family Medicine

## 2024-03-23 ENCOUNTER — Ambulatory Visit: Payer: Self-pay | Admitting: Family Medicine

## 2024-05-03 ENCOUNTER — Ambulatory Visit (HOSPITAL_BASED_OUTPATIENT_CLINIC_OR_DEPARTMENT_OTHER): Admitting: Pulmonary Disease

## 2024-05-05 ENCOUNTER — Ambulatory Visit: Admitting: Cardiovascular Disease

## 2024-05-13 ENCOUNTER — Ambulatory Visit (HOSPITAL_BASED_OUTPATIENT_CLINIC_OR_DEPARTMENT_OTHER): Admitting: Pulmonary Disease

## 2024-05-20 ENCOUNTER — Ambulatory Visit (HOSPITAL_COMMUNITY)

## 2024-06-07 ENCOUNTER — Encounter: Admitting: Family Medicine

## 2024-06-13 ENCOUNTER — Ambulatory Visit (HOSPITAL_BASED_OUTPATIENT_CLINIC_OR_DEPARTMENT_OTHER): Admitting: Pulmonary Disease

## 2024-06-18 ENCOUNTER — Ambulatory Visit: Admitting: Cardiology

## 2024-06-18 ENCOUNTER — Other Ambulatory Visit (HOSPITAL_COMMUNITY)
